# Patient Record
Sex: Female | Born: 1950
Health system: Southern US, Community
[De-identification: ages and names within clinical notes are randomized; demographics above are authoritative.]

## PROBLEM LIST (undated history)

## (undated) DIAGNOSIS — F319 Bipolar disorder, unspecified: Secondary | ICD-10-CM

## (undated) DIAGNOSIS — F329 Major depressive disorder, single episode, unspecified: Secondary | ICD-10-CM

## (undated) DIAGNOSIS — E669 Obesity, unspecified: Secondary | ICD-10-CM

## (undated) DIAGNOSIS — I2510 Atherosclerotic heart disease of native coronary artery without angina pectoris: Secondary | ICD-10-CM

## (undated) DIAGNOSIS — I509 Heart failure, unspecified: Secondary | ICD-10-CM

## (undated) DIAGNOSIS — J449 Chronic obstructive pulmonary disease, unspecified: Secondary | ICD-10-CM

## (undated) DIAGNOSIS — I639 Cerebral infarction, unspecified: Secondary | ICD-10-CM

## (undated) DIAGNOSIS — E039 Hypothyroidism, unspecified: Secondary | ICD-10-CM

## (undated) DIAGNOSIS — C73 Malignant neoplasm of thyroid gland: Secondary | ICD-10-CM

## (undated) DIAGNOSIS — E079 Disorder of thyroid, unspecified: Secondary | ICD-10-CM

## (undated) DIAGNOSIS — Z951 Presence of aortocoronary bypass graft: Secondary | ICD-10-CM

## (undated) DIAGNOSIS — F32A Depression, unspecified: Secondary | ICD-10-CM

## (undated) DIAGNOSIS — I10 Essential (primary) hypertension: Secondary | ICD-10-CM

## (undated) DIAGNOSIS — M1990 Unspecified osteoarthritis, unspecified site: Secondary | ICD-10-CM

## (undated) DIAGNOSIS — G459 Transient cerebral ischemic attack, unspecified: Secondary | ICD-10-CM

## (undated) DIAGNOSIS — I4892 Unspecified atrial flutter: Secondary | ICD-10-CM

## (undated) HISTORY — PX: TONSILLECTOMY: SUR1361

## (undated) HISTORY — DX: Unspecified atrial flutter: I48.92

## (undated) HISTORY — PX: CORONARY ARTERY BYPASS GRAFT: SHX141

## (undated) HISTORY — DX: Transient cerebral ischemic attack, unspecified: G45.9

## (undated) HISTORY — PX: ABDOMINAL HYSTERECTOMY: SHX81

---

## 2014-11-28 ENCOUNTER — Emergency Department (HOSPITAL_BASED_OUTPATIENT_CLINIC_OR_DEPARTMENT_OTHER)
Admission: EM | Admit: 2014-11-28 | Discharge: 2014-11-28 | Disposition: A | Discharge status: Home / Self Care | Payer: Medicare Other | Attending: Emergency Medicine | Admitting: Emergency Medicine

## 2014-11-28 ENCOUNTER — Encounter (HOSPITAL_BASED_OUTPATIENT_CLINIC_OR_DEPARTMENT_OTHER): Payer: Self-pay | Admitting: *Deleted

## 2014-11-28 DIAGNOSIS — Z8739 Personal history of other diseases of the musculoskeletal system and connective tissue: Secondary | ICD-10-CM | POA: Insufficient documentation

## 2014-11-28 DIAGNOSIS — I10 Essential (primary) hypertension: Secondary | ICD-10-CM | POA: Insufficient documentation

## 2014-11-28 DIAGNOSIS — Z85850 Personal history of malignant neoplasm of thyroid: Secondary | ICD-10-CM | POA: Insufficient documentation

## 2014-11-28 DIAGNOSIS — B3749 Other urogenital candidiasis: Secondary | ICD-10-CM | POA: Diagnosis not present

## 2014-11-28 DIAGNOSIS — L739 Follicular disorder, unspecified: Secondary | ICD-10-CM | POA: Diagnosis not present

## 2014-11-28 DIAGNOSIS — Z79899 Other long term (current) drug therapy: Secondary | ICD-10-CM | POA: Insufficient documentation

## 2014-11-28 DIAGNOSIS — E079 Disorder of thyroid, unspecified: Secondary | ICD-10-CM | POA: Diagnosis not present

## 2014-11-28 DIAGNOSIS — F329 Major depressive disorder, single episode, unspecified: Secondary | ICD-10-CM | POA: Diagnosis not present

## 2014-11-28 DIAGNOSIS — E669 Obesity, unspecified: Secondary | ICD-10-CM | POA: Insufficient documentation

## 2014-11-28 DIAGNOSIS — Z951 Presence of aortocoronary bypass graft: Secondary | ICD-10-CM | POA: Insufficient documentation

## 2014-11-28 DIAGNOSIS — R21 Rash and other nonspecific skin eruption: Secondary | ICD-10-CM | POA: Diagnosis present

## 2014-11-28 DIAGNOSIS — B379 Candidiasis, unspecified: Secondary | ICD-10-CM

## 2014-11-28 HISTORY — DX: Malignant neoplasm of thyroid gland: C73

## 2014-11-28 HISTORY — DX: Disorder of thyroid, unspecified: E07.9

## 2014-11-28 HISTORY — DX: Unspecified osteoarthritis, unspecified site: M19.90

## 2014-11-28 HISTORY — DX: Depression, unspecified: F32.A

## 2014-11-28 HISTORY — DX: Presence of aortocoronary bypass graft: Z95.1

## 2014-11-28 HISTORY — DX: Major depressive disorder, single episode, unspecified: F32.9

## 2014-11-28 HISTORY — DX: Essential (primary) hypertension: I10

## 2014-11-28 MED ORDER — FLUCONAZOLE 100 MG PO TABS: 200.0000 mg | ORAL_TABLET | Freq: Every day | ORAL | Status: AC

## 2014-11-28 MED ORDER — SULFAMETHOXAZOLE-TRIMETHOPRIM 800-160 MG PO TABS: 1.0000 | ORAL_TABLET | Freq: Every day | ORAL | Status: DC

## 2014-11-28 MED ORDER — SULFAMETHOXAZOLE-TRIMETHOPRIM 800-160 MG PO TABS
1.0000 | ORAL_TABLET | Freq: Once | ORAL | Status: AC
  Administered 2014-11-28: 1 via ORAL
  Filled 2014-11-28: qty 1

## 2014-11-28 MED ORDER — FLUCONAZOLE 50 MG PO TABS
150.0000 mg | ORAL_TABLET | Freq: Once | ORAL | Status: AC
  Administered 2014-11-28: 150 mg via ORAL
  Filled 2014-11-28 (×2): qty 1

## 2014-11-28 NOTE — ED Provider Notes (Signed)
CSN: [id number]     Arrival date & time 11/28/14  1638 History   First MD Initiated Contact with Patient 11/28/14 1756     Chief Complaint  Patient presents with  . Rash     (Consider location/radiation/quality/duration/timing/severity/associated sxs/prior Treatment) HPI Patient presents with concern of ongoing discomfort from multiple cutaneous lesions. Lesions have been present for at least several months, though more symptomatic discomfort has been occurring over the past few days. Patient describes irritation, burning in her lower abdomen, periumbilical area.  She also describes pruritus, burning on the multiple smaller areas scattered throughout her habitus. No concurrent f/c, n/v/d, cp/dyspnea.  She has previously been prescribed topical antifungal, though it is unclear if she took the course.  Past Medical History  Diagnosis Date  . Depression   . Hypertension   . Thyroid disease   . Thyroid ca   . S/P CABG x 3   . Arthritis    Past Surgical History  Procedure Laterality Date  . Coronary artery bypass graft    . Tonsillectomy    . Abdominal hysterectomy     History reviewed. No pertinent family history. History  Substance Use Topics  . Smoking status: Never Smoker   . Smokeless tobacco: Not on file  . Alcohol Use: No   OB History    No data available     Review of Systems  Constitutional:       Per HPI, otherwise negative  HENT:       Per HPI, otherwise negative  Respiratory:       Per HPI, otherwise negative  Cardiovascular:       Per HPI, otherwise negative  Gastrointestinal: Negative for vomiting.  Endocrine:       Negative aside from HPI  Genitourinary:       Neg aside from HPI   Musculoskeletal:       Per HPI, otherwise negative  Skin: Positive for color change.  Neurological: Negative for syncope.      Allergies  Chocolate and Peanut-containing drug products  Home Medications   Prior to Admission medications   Medication Sig Start  Date End Date Taking? Authorizing Provider  citalopram (CELEXA) 10 MG tablet Take 10 mg by mouth daily.   Yes Historical Provider, MD  gabapentin (NEURONTIN) 100 MG capsule Take 200 mg by mouth at bedtime.   Yes Historical Provider, MD  hydrochlorothiazide (MICROZIDE) 12.5 MG capsule Take 12.5 mg by mouth daily.   Yes Historical Provider, MD  hydrOXYzine (ATARAX/VISTARIL) 25 MG tablet Take 25 mg by mouth 3 (three) times daily as needed.   Yes Historical Provider, MD  levothyroxine (SYNTHROID, LEVOTHROID) 125 MCG tablet Take 125 mcg by mouth daily before breakfast.   Yes Historical Provider, MD  lisinopril (PRINIVIL,ZESTRIL) 20 MG tablet Take 20 mg by mouth daily.   Yes Historical Provider, MD  fluconazole (DIFLUCAN) 100 MG tablet Take 2 tablets (200 mg total) by mouth daily. 11/29/14 12/05/14  Carmin Muskrat, MD  sulfamethoxazole-trimethoprim (BACTRIM DS,SEPTRA DS) 800-160 MG per tablet Take 1 tablet by mouth daily. 11/28/14   Carmin Muskrat, MD   BP 135/80 mmHg  Pulse 83  Temp(Src) 98 F (36.7 C) (Oral)  Resp 18  Ht 5' (1.524 m)  Wt 260 lb (117.935 kg)  BMI 50.78 kg/m2  SpO2 95% Physical Exam  Constitutional: She is oriented to person, place, and time. She appears well-developed and well-nourished. No distress.  Obese F siting upright, speaking clearly, in NAD.  HENT:  Head: Normocephalic  and atraumatic.  Eyes: Conjunctivae and EOM are normal.  Cardiovascular: Normal rate and regular rhythm.   Pulmonary/Chest: Effort normal and breath sounds normal. No stridor. No respiratory distress.  Abdominal: She exhibits no distension.  Musculoskeletal: She exhibits no edema.  Neurological: She is alert and oriented to person, place, and time. No cranial nerve deficit.  Skin: Skin is warm and dry.     Psychiatric: She has a normal mood and affect.  Nursing note and vitals reviewed.   ED Course  Procedures (including critical care time)   MDM   Final diagnoses:  Candida infection    Folliculitis   2 presents with cutaneous lesions consistent with folliculitis and fungal infection. No evidence for bacteremia, sepsis.  Patient started on a course of antibiotic, provided local outpatient follow-up.    Carmin Muskrat, MD 11/28/14 [phone]

## 2014-11-28 NOTE — Discharge Instructions (Signed)
As discussed, your evaluation tonight has been largely reassuring. There is evidence for two types of skin infection, one yeast and one bacterial. Please take all medication as directed, and be sure to follow-up with a primary care physician.  Return here for concerning changes in your condition.

## 2014-11-28 NOTE — ED Notes (Signed)
Erythematous, burning excoriated area under pannus- states she has been treated for this in the past with a "cream", but never went away

## 2014-11-28 NOTE — ED Notes (Signed)
Pt c/o rash under breast x 1 month.

## 2015-06-22 ENCOUNTER — Emergency Department (HOSPITAL_BASED_OUTPATIENT_CLINIC_OR_DEPARTMENT_OTHER): Payer: Medicare Other

## 2015-06-22 ENCOUNTER — Inpatient Hospital Stay (HOSPITAL_BASED_OUTPATIENT_CLINIC_OR_DEPARTMENT_OTHER)
Admission: EM | Admit: 2015-06-22 | Discharge: 2015-06-26 | DRG: 292 | Disposition: A | Discharge status: Home Health | Payer: Medicare Other | Attending: Internal Medicine | Admitting: Internal Medicine

## 2015-06-22 ENCOUNTER — Encounter (HOSPITAL_BASED_OUTPATIENT_CLINIC_OR_DEPARTMENT_OTHER): Payer: Self-pay | Admitting: Emergency Medicine

## 2015-06-22 DIAGNOSIS — Z91010 Allergy to peanuts: Secondary | ICD-10-CM

## 2015-06-22 DIAGNOSIS — J441 Chronic obstructive pulmonary disease with (acute) exacerbation: Secondary | ICD-10-CM | POA: Diagnosis present

## 2015-06-22 DIAGNOSIS — Z90710 Acquired absence of both cervix and uterus: Secondary | ICD-10-CM | POA: Diagnosis not present

## 2015-06-22 DIAGNOSIS — R0602 Shortness of breath: Secondary | ICD-10-CM | POA: Diagnosis present

## 2015-06-22 DIAGNOSIS — D649 Anemia, unspecified: Secondary | ICD-10-CM | POA: Diagnosis present

## 2015-06-22 DIAGNOSIS — I10 Essential (primary) hypertension: Secondary | ICD-10-CM | POA: Diagnosis present

## 2015-06-22 DIAGNOSIS — I5030 Unspecified diastolic (congestive) heart failure: Secondary | ICD-10-CM | POA: Diagnosis present

## 2015-06-22 DIAGNOSIS — Z85850 Personal history of malignant neoplasm of thyroid: Secondary | ICD-10-CM

## 2015-06-22 DIAGNOSIS — J811 Chronic pulmonary edema: Secondary | ICD-10-CM | POA: Diagnosis present

## 2015-06-22 DIAGNOSIS — M1990 Unspecified osteoarthritis, unspecified site: Secondary | ICD-10-CM | POA: Diagnosis present

## 2015-06-22 DIAGNOSIS — J449 Chronic obstructive pulmonary disease, unspecified: Secondary | ICD-10-CM

## 2015-06-22 DIAGNOSIS — Z91018 Allergy to other foods: Secondary | ICD-10-CM

## 2015-06-22 DIAGNOSIS — R0600 Dyspnea, unspecified: Secondary | ICD-10-CM | POA: Diagnosis not present

## 2015-06-22 DIAGNOSIS — Z6841 Body Mass Index (BMI) 40.0 and over, adult: Secondary | ICD-10-CM

## 2015-06-22 DIAGNOSIS — E669 Obesity, unspecified: Secondary | ICD-10-CM | POA: Diagnosis present

## 2015-06-22 DIAGNOSIS — J9811 Atelectasis: Secondary | ICD-10-CM | POA: Diagnosis present

## 2015-06-22 DIAGNOSIS — I110 Hypertensive heart disease with heart failure: Secondary | ICD-10-CM | POA: Diagnosis present

## 2015-06-22 DIAGNOSIS — J810 Acute pulmonary edema: Secondary | ICD-10-CM | POA: Diagnosis present

## 2015-06-22 DIAGNOSIS — Z951 Presence of aortocoronary bypass graft: Secondary | ICD-10-CM

## 2015-06-22 DIAGNOSIS — I2510 Atherosclerotic heart disease of native coronary artery without angina pectoris: Secondary | ICD-10-CM | POA: Diagnosis present

## 2015-06-22 DIAGNOSIS — I5040 Unspecified combined systolic (congestive) and diastolic (congestive) heart failure: Secondary | ICD-10-CM

## 2015-06-22 DIAGNOSIS — Z9889 Other specified postprocedural states: Secondary | ICD-10-CM | POA: Diagnosis not present

## 2015-06-22 DIAGNOSIS — I5031 Acute diastolic (congestive) heart failure: Secondary | ICD-10-CM | POA: Diagnosis present

## 2015-06-22 DIAGNOSIS — F329 Major depressive disorder, single episode, unspecified: Secondary | ICD-10-CM | POA: Diagnosis present

## 2015-06-22 DIAGNOSIS — R0902 Hypoxemia: Secondary | ICD-10-CM | POA: Diagnosis present

## 2015-06-22 DIAGNOSIS — Z79899 Other long term (current) drug therapy: Secondary | ICD-10-CM | POA: Diagnosis not present

## 2015-06-22 DIAGNOSIS — E890 Postprocedural hypothyroidism: Secondary | ICD-10-CM | POA: Diagnosis present

## 2015-06-22 HISTORY — DX: Obesity, unspecified: E66.9

## 2015-06-22 LAB — CBC
HCT: 33.3 % — ABNORMAL LOW (ref 36.0–46.0)
HEMOGLOBIN: 10.1 g/dL — AB (ref 12.0–15.0)
MCH: 25.6 pg — ABNORMAL LOW (ref 26.0–34.0)
MCHC: 30.3 g/dL (ref 30.0–36.0)
MCV: 84.5 fL (ref 78.0–100.0)
PLATELETS: 350 10*3/uL (ref 150–400)
RBC: 3.94 MIL/uL (ref 3.87–5.11)
RDW: 23.1 % — ABNORMAL HIGH (ref 11.5–15.5)
WBC: 6.7 10*3/uL (ref 4.0–10.5)

## 2015-06-22 LAB — I-STAT CHEM 8, ED
BUN: 18 mg/dL (ref 6–20)
CALCIUM ION: 1.1 mmol/L — AB (ref 1.13–1.30)
CHLORIDE: 109 mmol/L (ref 101–111)
Creatinine, Ser: 1 mg/dL (ref 0.44–1.00)
GLUCOSE: 88 mg/dL (ref 65–99)
HCT: 34 % — ABNORMAL LOW (ref 36.0–46.0)
Hemoglobin: 11.6 g/dL — ABNORMAL LOW (ref 12.0–15.0)
Potassium: 3.9 mmol/L (ref 3.5–5.1)
Sodium: 143 mmol/L (ref 135–145)
TCO2: 29 mmol/L (ref 0–100)

## 2015-06-22 LAB — TROPONIN I: Troponin I: <0.03 ng/mL (ref <0.031)

## 2015-06-22 LAB — BRAIN NATRIURETIC PEPTIDE: B Natriuretic Peptide: 147.9 pg/mL — ABNORMAL HIGH (ref 0.0–100.0)

## 2015-06-22 MED ORDER — FUROSEMIDE 10 MG/ML IJ SOLN
20.0000 mg | Freq: Once | INTRAMUSCULAR | Status: AC
  Administered 2015-06-22: 20 mg via INTRAVENOUS
  Filled 2015-06-22: qty 2

## 2015-06-22 MED ORDER — ALBUTEROL SULFATE (2.5 MG/3ML) 0.083% IN NEBU
5.0000 mg | INHALATION_SOLUTION | Freq: Once | RESPIRATORY_TRACT | Status: AC
  Administered 2015-06-22: 5 mg via RESPIRATORY_TRACT
  Filled 2015-06-22: qty 6

## 2015-06-22 MED ORDER — IOHEXOL 350 MG/ML SOLN
100.0000 mL | Freq: Once | INTRAVENOUS | Status: AC | PRN
  Administered 2015-06-22: 100 mL via INTRAVENOUS

## 2015-06-22 NOTE — ED Notes (Signed)
Patient transported to CT 

## 2015-06-22 NOTE — ED Notes (Signed)
MD at bedside. 

## 2015-06-22 NOTE — ED Notes (Addendum)
Patient states that she has been sick for 1 week. The patient has audible wheezing and has increased dyspnea with excursion and activity. Patient reports that she has been without A/c for about 3 weeks. However her breathing has become progressively worse. The patient also reports that she can not lay down due to Dyspnea.

## 2015-06-22 NOTE — ED Notes (Signed)
Pt returned from CT °

## 2015-06-22 NOTE — ED Provider Notes (Signed)
CSN: [id number]     Arrival date & time 06/22/15  1723 History  This chart was scribed for Elnora Morrison, MD by Helane Gunther, ED Scribe. This patient was seen in room MH12/[facility]12 and the patient's care was started at 5:50 PM.    Chief Complaint  Patient presents with  . Shortness of Breath  The history is provided by the patient. No language interpreter was used.   HPI Comments: Tonya Hogan is a 64 y.o. female who presents to the Emergency Department complaining of SOB onset 1 week ago. She notes associated productive cough (yellow sputum), fever, chills, right side pain, ankle swelling. She states that she has no AC at home right now and has been very hot. She has a PSHx for internal hemorrhoids. She is not on oxygen at home and has no Hx of lung issues or PE. Pt denies CP. Nothing improves sob, worse with exertion. No PE hx.  Non smoker.  Past Medical History  Diagnosis Date  . Depression   . Hypertension   . Thyroid disease   . Thyroid ca   . S/P CABG x 3   . Arthritis   . Obesity    Past Surgical History  Procedure Laterality Date  . Coronary artery bypass graft    . Tonsillectomy    . Abdominal hysterectomy     History reviewed. No pertinent family history. History  Substance Use Topics  . Smoking status: Never Smoker   . Smokeless tobacco: Not on file  . Alcohol Use: No   OB History    No data available     Review of Systems  Respiratory: Positive for cough, shortness of breath and wheezing.   All other systems reviewed and are negative.   Allergies  Chocolate and Peanut-containing drug products  Home Medications   Prior to Admission medications   Medication Sig Start Date End Date Taking? Authorizing Provider  citalopram (CELEXA) 10 MG tablet Take 10 mg by mouth daily.    Historical Provider, MD  gabapentin (NEURONTIN) 100 MG capsule Take 200 mg by mouth at bedtime.    Historical Provider, MD  hydrochlorothiazide (MICROZIDE) 12.5 MG capsule Take 12.5 mg by  mouth daily.    Historical Provider, MD  hydrOXYzine (ATARAX/VISTARIL) 25 MG tablet Take 25 mg by mouth 3 (three) times daily as needed.    Historical Provider, MD  levothyroxine (SYNTHROID, LEVOTHROID) 125 MCG tablet Take 125 mcg by mouth daily before breakfast.    Historical Provider, MD  lisinopril (PRINIVIL,ZESTRIL) 20 MG tablet Take 20 mg by mouth daily.    Historical Provider, MD  sulfamethoxazole-trimethoprim (BACTRIM DS,SEPTRA DS) 800-160 MG per tablet Take 1 tablet by mouth daily. 11/28/14   Carmin Muskrat, MD   BP 134/61 mmHg  Pulse 78  Temp(Src) 98.3 F (36.8 C) (Oral)  Resp 14  Ht 5\' 1"  (1.549 m)  Wt 260 lb (117.935 kg)  BMI 49.15 kg/m2  SpO2 92% Physical Exam  Constitutional: She is oriented to person, place, and time. She appears well-developed and well-nourished. No distress.  HENT:  Head: Normocephalic and atraumatic.  Mouth/Throat: Oropharynx is clear and moist.  Eyes: Conjunctivae and EOM are normal. Pupils are equal, round, and reactive to light.  Neck: Normal range of motion. Neck supple. No tracheal deviation present.  Cardiovascular: Normal rate, regular rhythm and normal heart sounds.   Pulmonary/Chest: No respiratory distress. She has wheezes.  Anterior wheezing. Mild tachypnea  Abdominal: Soft.  Musculoskeletal: Normal range of motion.  Mild swelling  to the ankles and feet.  Neurological: She is alert and oriented to person, place, and time.  Skin: Skin is warm and dry.  Psychiatric: She has a normal mood and affect. Her behavior is normal.  Nursing note and vitals reviewed.   ED Course  Procedures  DIAGNOSTIC STUDIES: Oxygen Saturation is 89% on RA, low by my interpretation.    COORDINATION OF CARE: 5:55 PM - Discussed plans to order diagnostic studies. Pt advised of plan for treatment and pt agrees.  Labs Review Labs Reviewed  CBC - Abnormal; Notable for the following:    Hemoglobin 10.1 (*)    HCT 33.3 (*)    MCH 25.6 (*)    RDW 23.1 (*)     All other components within normal limits  BRAIN NATRIURETIC PEPTIDE - Abnormal; Notable for the following:    B Natriuretic Peptide 147.9 (*)    All other components within normal limits  I-STAT CHEM 8, ED - Abnormal; Notable for the following:    Calcium, Ion 1.10 (*)    Hemoglobin 11.6 (*)    HCT 34.0 (*)    All other components within normal limits  TROPONIN I    Imaging Review Dg Chest 2 View  06/22/2015   CLINICAL DATA:  Shortness of breath and wheezing 1 week.  EXAM: CHEST  2 VIEW  COMPARISON:  None.  FINDINGS: Sternotomy wires are present. Lungs are adequately inflated and demonstrate prominence of the perihilar markings bilaterally likely mild interstitial edema. There is linear density over the left midlung likely atelectasis. No evidence of effusion or pneumothorax. There is mild cardiomegaly. There are mild degenerate changes of the spine.  IMPRESSION: Mild cardiomegaly with evidence of mild vascular congestion/edema. Linear atelectasis left midlung.   Electronically Signed   By: Marin Olp M.D.   On: 06/22/2015 18:15   Ct Angio Chest Pe W/cm &/or Wo Cm  06/22/2015   CLINICAL DATA:  Dyspnea/short of breath. Hypoxia. Recent surgery. Productive cough. Fever. Chills. RIGHT-sided pain.  EXAM: CT ANGIOGRAPHY CHEST WITH CONTRAST  TECHNIQUE: Multidetector CT imaging of the chest was performed using the standard protocol during bolus administration of intravenous contrast. Multiplanar CT image reconstructions and MIPs were obtained to evaluate the vascular anatomy.  CONTRAST:  162mL OMNIPAQUE IOHEXOL 350 MG/ML SOLN  COMPARISON:  Chest radiograph 06/22/2015.  FINDINGS: Bones: Lower cervical spondylosis partially visible. No aggressive osseous lesions. Median sternotomy.  Cardiovascular: Aortic atherosclerosis. Technically adequate study for evaluation of pulmonary embolism. No pulmonary embolus is present. No acute aortic abnormality.  Lungs: Interlobular septal thickening is present and there  are areas of atelectasis along with mosaic attenuation of the lungs. Most of the areas of airspace opacification appear linear. The overall picture is compatible with CHF and pulmonary edema. Pneumonia is unlikely based on the pattern.  Central airways: Patent.  Effusions: None.  Lymphadenopathy: None.  Esophagus: Small hiatal hernia.  Upper abdomen: No acute abnormality.  Other: Thyroidectomy.  Review of the MIP images confirms the above findings.  IMPRESSION: 1. Negative for pulmonary embolus or acute aortic abnormality. 2. Ground-glass attenuation in the lungs with scattered areas of airspace opacification and prominent atelectasis. Findings are most compatible with pulmonary edema and CHF. Pneumonia is unlikely.   Electronically Signed   By: Dereck Ligas M.D.   On: 06/22/2015 19:54     EKG Interpretation   Date/Time:  Sunday June 22 2015 17:38:15 EDT Ventricular Rate:  88 PR Interval:  152 QRS Duration: 86 QT Interval:  432  QTC Calculation: 522 R Axis:   61 Text Interpretation:  Normal sinus rhythm Nonspecific T wave abnormality  Prolonged QT Abnormal ECG Confirmed by Daily Doe  MD, Derra Shartzer ([id number]) on  06/22/2015 7:14:24 PM      MDM   Final diagnoses:  Hypoxia  Dyspnea   Patient presents with worsening dyspnea, wheezing for the past week. Clinical concern for pneumonia versus heart failure. Chest x-ray reviewed interstitial findings. Patient had recent surgery, CT scan ordered no blood clot, palmar edema. Patient improved on reassessment, 2 L urine output with Lasix. Patient requiring 1-2 L nasal cannula. Discussed with [facility] hospitalist for transfer/admission for further workup.  The patients results and plan were reviewed and discussed.   Any x-rays performed were independently reviewed by myself.   Differential diagnosis were considered with the presenting HPI.  Medications  furosemide (LASIX) injection 20 mg (not administered)  albuterol (PROVENTIL) (2.5 MG/3ML) 0.083%  nebulizer solution 5 mg (5 mg Nebulization Given 06/22/15 1753)  albuterol (PROVENTIL) (2.5 MG/3ML) 0.083% nebulizer solution 5 mg (5 mg Nebulization Given 06/22/15 1816)  furosemide (LASIX) injection 20 mg (20 mg Intravenous Given 06/22/15 1955)  iohexol (OMNIPAQUE) 350 MG/ML injection 100 mL (100 mLs Intravenous Contrast Given 06/22/15 1930)    Filed Vitals:   06/22/15 1830 06/22/15 1900 06/22/15 2000 06/22/15 2144  BP: 148/56 134/61 135/61 144/72  Pulse: 81 78 79 67  Temp:      TempSrc:      Resp: 27 14 20 16   Height:      Weight:      SpO2: 89% 92% 95% 96%    Final diagnoses:  Hypoxia  Dyspnea    Admission/ observation were discussed with the admitting physician, patient and/or family and they are comfortable with the plan.    Elnora Morrison, MD 06/22/15 2250

## 2015-06-22 NOTE — ED Notes (Signed)
Patient in CT

## 2015-06-23 ENCOUNTER — Encounter (HOSPITAL_COMMUNITY): Payer: Self-pay | Admitting: Internal Medicine

## 2015-06-23 DIAGNOSIS — E890 Postprocedural hypothyroidism: Secondary | ICD-10-CM | POA: Diagnosis present

## 2015-06-23 DIAGNOSIS — D649 Anemia, unspecified: Secondary | ICD-10-CM | POA: Diagnosis present

## 2015-06-23 DIAGNOSIS — R0602 Shortness of breath: Secondary | ICD-10-CM | POA: Diagnosis present

## 2015-06-23 DIAGNOSIS — I5040 Unspecified combined systolic (congestive) and diastolic (congestive) heart failure: Secondary | ICD-10-CM

## 2015-06-23 DIAGNOSIS — I110 Hypertensive heart disease with heart failure: Secondary | ICD-10-CM | POA: Diagnosis present

## 2015-06-23 LAB — COMPREHENSIVE METABOLIC PANEL
ALT: 12 U/L — AB (ref 14–54)
AST: 13 U/L — ABNORMAL LOW (ref 15–41)
Albumin: 3.1 g/dL — ABNORMAL LOW (ref 3.5–5.0)
Alkaline Phosphatase: 61 U/L (ref 38–126)
Anion gap: 3 — ABNORMAL LOW (ref 5–15)
BILIRUBIN TOTAL: 0.4 mg/dL (ref 0.3–1.2)
BUN: 9 mg/dL (ref 6–20)
CALCIUM: 8.7 mg/dL — AB (ref 8.9–10.3)
CO2: 36 mmol/L — AB (ref 22–32)
CREATININE: 0.84 mg/dL (ref 0.44–1.00)
Chloride: 102 mmol/L (ref 101–111)
GFR calc Af Amer: >60 mL/min (ref >60)
GFR calc non Af Amer: >60 mL/min (ref >60)
Glucose, Bld: 101 mg/dL — ABNORMAL HIGH (ref 65–99)
Potassium: 3.3 mmol/L — ABNORMAL LOW (ref 3.5–5.1)
SODIUM: 141 mmol/L (ref 135–145)
Total Protein: 7.6 g/dL (ref 6.5–8.1)

## 2015-06-23 LAB — CBC WITH DIFFERENTIAL/PLATELET
Basophils Absolute: 0.1 10*3/uL (ref 0.0–0.1)
Basophils Relative: 1 % (ref 0–1)
EOS ABS: 0.4 10*3/uL (ref 0.0–0.7)
Eosinophils Relative: 7 % — ABNORMAL HIGH (ref 0–5)
HCT: 32.2 % — ABNORMAL LOW (ref 36.0–46.0)
HEMOGLOBIN: 9.4 g/dL — AB (ref 12.0–15.0)
Lymphocytes Relative: 28 % (ref 12–46)
Lymphs Abs: 1.6 10*3/uL (ref 0.7–4.0)
MCH: 24.2 pg — ABNORMAL LOW (ref 26.0–34.0)
MCHC: 29.2 g/dL — ABNORMAL LOW (ref 30.0–36.0)
MCV: 82.8 fL (ref 78.0–100.0)
Monocytes Absolute: 0.8 10*3/uL (ref 0.1–1.0)
Monocytes Relative: 14 % — ABNORMAL HIGH (ref 3–12)
NEUTROS PCT: 50 % (ref 43–77)
Neutro Abs: 2.9 10*3/uL (ref 1.7–7.7)
PLATELETS: 301 10*3/uL (ref 150–400)
RBC: 3.89 MIL/uL (ref 3.87–5.11)
RDW: 21.8 % — AB (ref 11.5–15.5)
WBC: 5.8 10*3/uL (ref 4.0–10.5)

## 2015-06-23 LAB — TROPONIN I
Troponin I: <0.03 ng/mL (ref <0.031)
Troponin I: <0.03 ng/mL (ref <0.031)

## 2015-06-23 LAB — TSH: TSH: 2.715 u[IU]/mL (ref 0.350–4.500)

## 2015-06-23 MED ORDER — ACETAMINOPHEN 650 MG RE SUPP: 650.0000 mg | Freq: Four times a day (QID) | RECTAL | Status: DC | PRN

## 2015-06-23 MED ORDER — PREDNISONE 20 MG PO TABS
40.0000 mg | ORAL_TABLET | Freq: Two times a day (BID) | ORAL | Status: DC
  Administered 2015-06-23 – 2015-06-26 (×7): 40 mg via ORAL
  Filled 2015-06-23 (×10): qty 2

## 2015-06-23 MED ORDER — CITALOPRAM HYDROBROMIDE 10 MG PO TABS
10.0000 mg | ORAL_TABLET | Freq: Every day | ORAL | Status: DC
Start: 2015-06-23 — End: 2015-06-26
  Administered 2015-06-23 – 2015-06-26 (×4): 10 mg via ORAL
  Filled 2015-06-23 (×4): qty 1

## 2015-06-23 MED ORDER — HYDROCHLOROTHIAZIDE 12.5 MG PO CAPS
12.5000 mg | ORAL_CAPSULE | Freq: Every day | ORAL | Status: DC
  Administered 2015-06-23 – 2015-06-26 (×4): 12.5 mg via ORAL
  Filled 2015-06-23 (×4): qty 1

## 2015-06-23 MED ORDER — ACETAMINOPHEN 325 MG PO TABS: 650.0000 mg | ORAL_TABLET | Freq: Four times a day (QID) | ORAL | Status: DC | PRN

## 2015-06-23 MED ORDER — GABAPENTIN 100 MG PO CAPS
200.0000 mg | ORAL_CAPSULE | Freq: Every day | ORAL | Status: DC
  Administered 2015-06-23 – 2015-06-25 (×3): 200 mg via ORAL
  Filled 2015-06-23 (×4): qty 2

## 2015-06-23 MED ORDER — ONDANSETRON HCL 4 MG/2ML IJ SOLN: 4.0000 mg | Freq: Four times a day (QID) | INTRAMUSCULAR | Status: DC | PRN

## 2015-06-23 MED ORDER — HYDROXYZINE HCL 25 MG PO TABS
25.0000 mg | ORAL_TABLET | Freq: Three times a day (TID) | ORAL | Status: DC | PRN
  Filled 2015-06-23: qty 1

## 2015-06-23 MED ORDER — LISINOPRIL 20 MG PO TABS
20.0000 mg | ORAL_TABLET | Freq: Every day | ORAL | Status: DC
  Administered 2015-06-23 – 2015-06-26 (×4): 20 mg via ORAL
  Filled 2015-06-23 (×4): qty 1

## 2015-06-23 MED ORDER — ALBUTEROL SULFATE (2.5 MG/3ML) 0.083% IN NEBU
2.5000 mg | INHALATION_SOLUTION | Freq: Four times a day (QID) | RESPIRATORY_TRACT | Status: DC | PRN
Start: 2015-06-23 — End: 2015-06-26

## 2015-06-23 MED ORDER — SODIUM CHLORIDE 0.9 % IJ SOLN
3.0000 mL | Freq: Two times a day (BID) | INTRAMUSCULAR | Status: DC
  Administered 2015-06-23 – 2015-06-26 (×7): 3 mL via INTRAVENOUS

## 2015-06-23 MED ORDER — POTASSIUM CHLORIDE CRYS ER 20 MEQ PO TBCR
40.0000 meq | EXTENDED_RELEASE_TABLET | Freq: Once | ORAL | Status: AC
  Administered 2015-06-23: 40 meq via ORAL
  Filled 2015-06-23: qty 2

## 2015-06-23 MED ORDER — OXYCODONE HCL 5 MG PO TABS
5.0000 mg | ORAL_TABLET | ORAL | Status: DC | PRN
  Administered 2015-06-23 – 2015-06-24 (×2): 5 mg via ORAL
  Filled 2015-06-23 (×2): qty 1

## 2015-06-23 MED ORDER — ENOXAPARIN SODIUM 60 MG/0.6ML ~~LOC~~ SOLN
60.0000 mg | SUBCUTANEOUS | Status: DC
  Administered 2015-06-23 – 2015-06-25 (×3): 60 mg via SUBCUTANEOUS
  Filled 2015-06-23 (×4): qty 0.6

## 2015-06-23 MED ORDER — FUROSEMIDE 10 MG/ML IJ SOLN
40.0000 mg | Freq: Every day | INTRAMUSCULAR | Status: DC
  Administered 2015-06-23: 40 mg via INTRAVENOUS
  Filled 2015-06-23 (×2): qty 4

## 2015-06-23 MED ORDER — LEVOTHYROXINE SODIUM 125 MCG PO TABS
125.0000 ug | ORAL_TABLET | Freq: Every day | ORAL | Status: DC
  Administered 2015-06-23 – 2015-06-26 (×4): 125 ug via ORAL
  Filled 2015-06-23 (×6): qty 1

## 2015-06-23 MED ORDER — ONDANSETRON HCL 4 MG PO TABS: 4.0000 mg | ORAL_TABLET | Freq: Four times a day (QID) | ORAL | Status: DC | PRN

## 2015-06-23 MED ORDER — POTASSIUM CHLORIDE CRYS ER 10 MEQ PO TBCR
10.0000 meq | EXTENDED_RELEASE_TABLET | Freq: Every day | ORAL | Status: DC
Start: 2015-06-23 — End: 2015-06-23
  Filled 2015-06-23: qty 1

## 2015-06-23 NOTE — Progress Notes (Signed)
[facility] PROGRESS NOTE  Glenn Christo QPY:[id number] DOB: [birth year] DOA: 06/22/2015  PCP: Patient patient's primary care physician in [facility].  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 She underwent hemorrhoidectomy last week. Patient was evaluated in the emergency department. CT angiogram of the chest did not show any pulmonary embolism but did show pulmonary edema. Patient was hospitalized for further management.  Past medical history:  Past Medical History  Diagnosis Date  . Depression   . Hypertension   . Thyroid disease   . Thyroid ca   . S/P CABG x 3   . Arthritis   . Obesity     Consultants: None  Procedures:  2-D echocardiogram is pending  Antibiotics: None  Subjective: Patient feels better this morning. Not as short of breath as she was yesterday. Denies any chest pain, nausea or vomiting. Does have some swelling in her lower extremities.  Objective: Vital Signs  Filed Vitals:   06/23/15 0017 06/23/15 0105 06/23/15 0423 06/23/15 1031  BP: 140/54 156/78 124/51 152/64  Pulse: 65 75 70 65  Temp: 98.1 F (36.7 C) 98.4 F (36.9 C) 98.1 F (36.7 C) 99.1 F (37.3 C)  TempSrc: Oral Oral Oral Oral  Resp: 20 18 16 18   Height:  5' (1.524 m)    Weight:  116.166 kg (256 lb 1.6 oz)    SpO2: 95% 94% 97% 93%    Intake/Output Summary (Last 24 hours) at 06/23/15 1405 Last data filed at 06/23/15 1230  Gross per 24 hour  Intake    762 ml  Output   3800 ml  Net  -3038 ml   Filed Weights   06/22/15 1727 06/23/15 0105  Weight: 117.935 kg (260 lb) 116.166 kg (256 lb 1.6 oz)    General appearance: alert, cooperative, appears stated age and no distress Resp: Diminished air entry at the bases. scattered wheezes bilaterally.Marland Kitchen No crackles. Cardio: regular rate and rhythm, S1, S2  normal, no murmur, click, rub or gallop GI: soft, non-tender; bowel sounds normal; no masses,  no organomegaly Extremities: Pedal edema noted bilateral lower extremities Neurologic: No focal deficits  Lab Results:  Basic Metabolic Panel:  Recent Labs Lab 06/22/15 1744 06/23/15 0421  NA 143 141  K 3.9 3.3*  CL 109 102  CO2  --  36*  GLUCOSE 88 101*  BUN 18 9  CREATININE 1.00 0.84  CALCIUM  --  8.7*   Liver Function Tests:  Recent Labs Lab 06/23/15 0421  AST 13*  ALT 12*  ALKPHOS 61  BILITOT 0.4  PROT 7.6  ALBUMIN 3.1*    CBC:  Recent Labs Lab 06/22/15 1743 06/22/15 1744 06/23/15 0421  WBC 6.7  --  5.8  NEUTROABS  --   --  2.9  HGB 10.1* 11.6* 9.4*  HCT 33.3* 34.0* 32.2*  MCV 84.5  --  82.8  PLT 350  --  301   Cardiac Enzymes:  Recent Labs Lab 06/22/15 1743 06/23/15 0421 06/23/15 0843  TROPONINI <0.03 <0.03 <0.03   BNP (last 3 results)  Recent Labs  06/22/15 1743  BNP 147.9*     Studies/Results: Dg Chest 2 View  06/22/2015   CLINICAL DATA:  Shortness of breath and wheezing 1 week.  EXAM: CHEST  2 VIEW  COMPARISON:  None.  FINDINGS: Sternotomy wires are present. Lungs are adequately inflated and demonstrate prominence of the perihilar  markings bilaterally likely mild interstitial edema. There is linear density over the left midlung likely atelectasis. No evidence of effusion or pneumothorax. There is mild cardiomegaly. There are mild degenerate changes of the spine.  IMPRESSION: Mild cardiomegaly with evidence of mild vascular congestion/edema. Linear atelectasis left midlung.   Electronically Signed   By: Marin Olp M.D.   On: 06/22/2015 18:15   Ct Angio Chest Pe W/cm &/or Wo Cm  06/22/2015   CLINICAL DATA:  Dyspnea/short of breath. Hypoxia. Recent surgery. Productive cough. Fever. Chills. RIGHT-sided pain.  EXAM: CT ANGIOGRAPHY CHEST WITH CONTRAST  TECHNIQUE: Multidetector CT imaging of the chest was performed using the standard protocol  during bolus administration of intravenous contrast. Multiplanar CT image reconstructions and MIPs were obtained to evaluate the vascular anatomy.  CONTRAST:  145mL OMNIPAQUE IOHEXOL 350 MG/ML SOLN  COMPARISON:  Chest radiograph 06/22/2015.  FINDINGS: Bones: Lower cervical spondylosis partially visible. No aggressive osseous lesions. Median sternotomy.  Cardiovascular: Aortic atherosclerosis. Technically adequate study for evaluation of pulmonary embolism. No pulmonary embolus is present. No acute aortic abnormality.  Lungs: Interlobular septal thickening is present and there are areas of atelectasis along with mosaic attenuation of the lungs. Most of the areas of airspace opacification appear linear. The overall picture is compatible with CHF and pulmonary edema. Pneumonia is unlikely based on the pattern.  Central airways: Patent.  Effusions: None.  Lymphadenopathy: None.  Esophagus: Small hiatal hernia.  Upper abdomen: No acute abnormality.  Other: Thyroidectomy.  Review of the MIP images confirms the above findings.  IMPRESSION: 1. Negative for pulmonary embolus or acute aortic abnormality. 2. Ground-glass attenuation in the lungs with scattered areas of airspace opacification and prominent atelectasis. Findings are most compatible with pulmonary edema and CHF. Pneumonia is unlikely.   Electronically Signed   By: Dereck Ligas M.D.   On: 06/22/2015 19:54    Medications:  Scheduled: . citalopram  10 mg Oral Daily  . enoxaparin (LOVENOX) injection  60 mg Subcutaneous Q24H  . furosemide  40 mg Intravenous Daily  . gabapentin  200 mg Oral QHS  . hydrochlorothiazide  12.5 mg Oral Daily  . levothyroxine  125 mcg Oral QAC breakfast  . lisinopril  20 mg Oral Daily  . predniSONE  40 mg Oral BID WC  . sodium chloride  3 mL Intravenous Q12H   Continuous:  [id number] **OR** acetaminophen, hydrOXYzine, ondansetron **OR** ondansetron (ZOFRAN) IV, oxyCODONE  Assessment/Plan:  Principal Problem:    Pulmonary edema Active Problems:   Essential hypertension   Chronic anemia   Post-operative hypothyroidism    Acute pulmonary edema No known history of congestive heart failure. Echocardiogram is pending. Continue IV Lasix. Strict ins and outs. Daily weights. Patient is symptomatically improved. Replace potassium.  Bilateral wheezing There could be an element of superimposed bronchitis as well. Started on steroids. Nebulizers as needed.  History of essential hypertension Continue medications. Monitor blood pressures closely.  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 She does not follow up with any cardiologist or cardiothoracic surgeon at this time. Follow-up on echocardiogram.  Chronic anemia Monitor hemoglobin.   DVT Prophylaxis: Lovenox    Code Status: Full code  Family Communication: Discussed with the patient  Disposition Plan: Await improvement in symptoms. Await echocardiogram.     LOS: 1 day   [facility] Pager [phone] 06/23/2015, 2:05 PM  If 7PM-7AM,  please contact night-coverage at [email or web address], password [facility]

## 2015-06-23 NOTE — Evaluation (Signed)
Physical Therapy Evaluation Patient Details Name: Tonya Hogan MRN: [id number] DOB: May 04, 1951 Today's Date: 06/23/2015   History of Present Illness  64 y.o. 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 Patient has been feeling short of breath over the last 1 week with exertion. Denies any productive cough fever or chills. 2 weeks ago patient had chest pain which was self-limited retrosternal pressure-like. Patient still has some pain in the left lower rib area which is tender to touch. CT angiogram of the chest done shows features concerning for pulmonary edema and patient was given 40 mg IV Lasix in the ER following which patient has had good diuresis.  Clinical Impression  Pt presents with generalized weakness, decreased balance and decreased activity tolerance.  Note that pt on RA when PT arrived and SaO2 at 84-88%, therefore donned 2L O2 during gait with pt able to maintain sats in the 90's.  RN made aware.  Will continue to see pt acutely to address deficits.  PT recommends HHPT for follow up, however concerned about pts living conditions in the fact that she does not have air conditioning in new town home that her and family are renting.  Spoke with CSW who is to speak with pt regarding resources.      Follow Up Recommendations Home health PT    Equipment Recommendations  Rolling walker with 5" wheels    Recommendations for Other Services       Precautions / Restrictions Precautions Precautions: Fall Precaution Comments: monitor O2 Restrictions Weight Bearing Restrictions: No      Mobility  Bed Mobility Overal bed mobility: Modified Independent             General bed mobility comments: Pt requires increased time and effort to get to EOB with HOB flat and without rails.  Cues not to pull on  therapist to elevate trunk.   Transfers Overall transfer level: Needs assistance Equipment used: Rolling walker (2 wheeled) Transfers: Sit to/from Stand Sit to Stand: Supervision         General transfer comment: Min cues for hand placement and safety.   Ambulation/Gait Ambulation/Gait assistance: Min guard;Supervision Ambulation Distance (Feet): 200 Feet (15 to restroom) Assistive device: Rolling walker (2 wheeled);None Gait Pattern/deviations: Step-through pattern;Decreased stride length;Trendelenburg;Wide base of support;Drifts right/left     General Gait Details: Initially had pt ambulate to/from restroom without use of AD and note pt tends to "furniture walk" and states that she does this at home.  Pt much more unsteady in this manner.  Provided pt with RW for gait in hallway and note marked improvement in balance and pt able to ambulate at S level.   Stairs            Wheelchair Mobility    Modified Rankin (Stroke Patients Only)       Balance Overall balance assessment: Needs assistance Sitting-balance support: Feet supported Sitting balance-Leahy Scale: Good     Standing balance support: During functional activity;No upper extremity supported Standing balance-Leahy Scale: Fair Standing balance comment: Pt able to stand at sink without UE support to wash/dry hands at S level.                              Pertinent Vitals/Pain Pain Assessment: 0-10 Pain Score: 10-Worst pain ever Pain Location: L side  Pain Descriptors / Indicators: Aching;Sharp Pain Intervention(s): Limited activity within patient's tolerance;Premedicated before session;Monitored during session    Home Living Family/patient expects to be discharged to:: Private residence Living Arrangements: Children Available Help at Discharge: Family Type of Home:  (townhouse) Home Access: Level entry     Home Layout: Two level Home Equipment: None      Prior Function Level of  Independence: Independent               Hand Dominance        Extremity/Trunk Assessment   Upper Extremity Assessment: Defer to OT evaluation           Lower Extremity Assessment: Generalized weakness      Cervical / Trunk Assessment: Kyphotic  Communication   Communication: No difficulties  Cognition Arousal/Alertness: Awake/alert Behavior During Therapy: WFL for tasks assessed/performed Overall Cognitive Status: Within Functional Limits for tasks assessed                      General Comments      Exercises        Assessment/Plan    PT Assessment Patient needs continued PT services  PT Diagnosis Difficulty walking;Generalized weakness   PT Problem List Decreased strength;Decreased activity tolerance;Decreased balance;Decreased mobility;Decreased knowledge of use of DME;Decreased knowledge of precautions;Cardiopulmonary status limiting activity;Obesity  PT Treatment Interventions DME instruction;Gait training;Stair training;Functional mobility training;Therapeutic activities;Therapeutic exercise;Balance training;Patient/family education   PT Goals (Current goals can be found in the Care Plan section) Acute Rehab PT Goals Patient Stated Goal: to return home and feel better PT Goal Formulation: With patient Time For Goal Achievement: 06/30/15 Potential to Achieve Goals: Good    Frequency Min 3X/week   Barriers to discharge Decreased caregiver support      Co-evaluation               End of Session Equipment Utilized During Treatment: Oxygen Activity Tolerance: Patient limited by fatigue Patient left: in chair;with call bell/phone within reach Nurse Communication: Mobility status;Other (comment) (O2 sats)    Functional Assessment Tool Used: clinical judgement Functional Limitation: Mobility: Walking and moving around Mobility: Walking and Moving Around Current Status [phone]): At least 1 percent but less than 20 percent impaired,  limited or restricted Mobility: Walking and Moving Around Goal Status [phone]): 0 percent impaired, limited or restricted    Time: 1338-1415 PT Time Calculation (min) (ACUTE ONLY): 37 min   Charges:   PT Evaluation $Initial PT Evaluation Tier I: 1 Procedure PT Treatments $Gait Training: 8-22 mins   PT G Codes:   PT G-Codes **NOT FOR INPATIENT CLASS** Functional Assessment Tool Used: clinical judgement Functional Limitation: Mobility: Walking and moving around Mobility: Walking and Moving Around Current Status (J8841): At least 1 percent but less than 20 percent impaired, limited or restricted Mobility: Walking and Moving Around Goal Status [phone]): 0 percent impaired, limited or restricted    Denice Bors 06/23/2015, 3:03 PM

## 2015-06-23 NOTE — Plan of Care (Signed)
Problem: Phase I Progression Outcomes Goal: Pain controlled with appropriate interventions Outcome: Not Progressing Pt with complaints of left lower rib pain which was not relieved with prn pain medication. Pt is able to perform adls and move around without any complaints of increased pain. Will monitor.  Problem: Phase I Progression Outcomes Goal: Dyspnea controlled at rest (HF) Outcome: Progressing Pt noted with dyspnea with exertion, but none noted at rest. Pt encouraged to take rest breaks with activity.

## 2015-06-23 NOTE — H&P (Signed)
[facility] History and Physical  Tonya Hogan WJX:[id number] DOB: [birth year] DOA: 06/22/2015  Referring physician: Patient was transferred from [facility] [place]. PCP: No PCP Per Patient patient's primary care physician in [facility]. Specialists: None.  Chief Complaint: Shortness of breath.  HPI: Tonya Hogan is a 64 y.o. 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 Patient has been feeling short of breath over the last 1 week with exertion. Denies any productive cough fever or chills. 2 weeks ago patient had chest pain which was self-limited retrosternal pressure-like. Patient still has some pain in the left lower rib area which is tender to touch. CT angiogram of the chest done shows features concerning for pulmonary edema and patient was given 40 mg IV Lasix in the ER following which patient has had good diuresis. Patient presently is not in distress.   Review of Systems: As presented in the history of presenting illness, rest negative.  Past Medical History  Diagnosis Date  . Depression   . Hypertension   . Thyroid disease   . Thyroid ca   . S/P CABG x 3   . Arthritis   . Obesity    Past Surgical History  Procedure Laterality Date  . Coronary artery bypass graft    . Tonsillectomy    . Abdominal hysterectomy     Social History:  reports that she has never smoked. She does not have any smokeless tobacco history on file. She reports that she does not drink alcohol or use illicit drugs. Where does patient live - home. Can patient participate in ADLs? Yes.  Allergies  Allergen Reactions  . Chocolate   . Peanut-Containing Drug Products     Family History:  Family History  Problem Relation Age of Onset  . Hypertension Sister   . Bipolar disorder Other       Prior to Admission  medications   Medication Sig Start Date End Date Taking? Authorizing Provider  citalopram (CELEXA) 10 MG tablet Take 10 mg by mouth daily.    Historical Provider, MD  gabapentin (NEURONTIN) 100 MG capsule Take 200 mg by mouth at bedtime.    Historical Provider, MD  hydrochlorothiazide (MICROZIDE) 12.5 MG capsule Take 12.5 mg by mouth daily.    Historical Provider, MD  hydrOXYzine (ATARAX/VISTARIL) 25 MG tablet Take 25 mg by mouth 3 (three) times daily as needed.    Historical Provider, MD  levothyroxine (SYNTHROID, LEVOTHROID) 125 MCG tablet Take 125 mcg by mouth daily before breakfast.    Historical Provider, MD  lisinopril (PRINIVIL,ZESTRIL) 20 MG tablet Take 20 mg by mouth daily.    Historical Provider, MD  sulfamethoxazole-trimethoprim (BACTRIM DS,SEPTRA DS) 800-160 MG per tablet Take 1 tablet by mouth daily. 11/28/14   Carmin Muskrat, MD    Physical Exam: Filed Vitals:   06/22/15 2332 06/23/15 0000 06/23/15 0017 06/23/15 0105  BP: 138/69 144/68 140/54 156/78  Pulse: 61 70 65 75  Temp:   98.1 F (36.7 C) 98.4 F (36.9 C)  TempSrc:   Oral Oral  Resp: 20 20 20 18   Height:    5' (1.524 m)  Weight:    116.166 kg (256 lb 1.6 oz)  SpO2: 94% 95% 95% 94%     General:  Obese and not in distress.  Eyes: Anicteric. No pallor.  ENT: No discharge from ears eyes nose and mouth.  Neck: JVD mildly elevated no mass felt.  Cardiovascular: S1 and S2 heard.  Respiratory: No rhonchi or crepitations.  Abdomen: Soft nontender bowel sounds present.  Skin: No rash.  Musculoskeletal: Mild edema.  Psychiatric: Appears normal.  Neurologic: Alert awake oriented to time place and person. Moves all extremities.  Labs on Admission:  Basic Metabolic Panel:  Recent Labs Lab 06/22/15 1744  NA 143  K 3.9  CL 109  GLUCOSE 88  BUN 18  CREATININE 1.00   Liver Function Tests: No results for input(s): AST, ALT, ALKPHOS, BILITOT, PROT, ALBUMIN in the last 168 hours. No results for input(s):  LIPASE, AMYLASE in the last 168 hours. No results for input(s): AMMONIA in the last 168 hours. CBC:  Recent Labs Lab 06/22/15 1743 06/22/15 1744  WBC 6.7  --   HGB 10.1* 11.6*  HCT 33.3* 34.0*  MCV 84.5  --   PLT 350  --    Cardiac Enzymes:  Recent Labs Lab 06/22/15 1743  TROPONINI <0.03    BNP (last 3 results)  Recent Labs  06/22/15 1743  BNP 147.9*    ProBNP (last 3 results) No results for input(s): PROBNP in the last 8760 hours.  CBG: No results for input(s): GLUCAP in the last 168 hours.  Radiological Exams on Admission: Dg Chest 2 View  06/22/2015   CLINICAL DATA:  Shortness of breath and wheezing 1 week.  EXAM: CHEST  2 VIEW  COMPARISON:  None.  FINDINGS: Sternotomy wires are present. Lungs are adequately inflated and demonstrate prominence of the perihilar markings bilaterally likely mild interstitial edema. There is linear density over the left midlung likely atelectasis. No evidence of effusion or pneumothorax. There is mild cardiomegaly. There are mild degenerate changes of the spine.  IMPRESSION: Mild cardiomegaly with evidence of mild vascular congestion/edema. Linear atelectasis left midlung.   Electronically Signed   By: Marin Olp M.D.   On: 06/22/2015 18:15   Ct Angio Chest Pe W/cm &/or Wo Cm  06/22/2015   CLINICAL DATA:  Dyspnea/short of breath. Hypoxia. Recent surgery. Productive cough. Fever. Chills. RIGHT-sided pain.  EXAM: CT ANGIOGRAPHY CHEST WITH CONTRAST  TECHNIQUE: Multidetector CT imaging of the chest was performed using the standard protocol during bolus administration of intravenous contrast. Multiplanar CT image reconstructions and MIPs were obtained to evaluate the vascular anatomy.  CONTRAST:  165mL OMNIPAQUE IOHEXOL 350 MG/ML SOLN  COMPARISON:  Chest radiograph 06/22/2015.  FINDINGS: Bones: Lower cervical spondylosis partially visible. No aggressive osseous lesions. Median sternotomy.  Cardiovascular: Aortic atherosclerosis. Technically  adequate study for evaluation of pulmonary embolism. No pulmonary embolus is present. No acute aortic abnormality.  Lungs: Interlobular septal thickening is present and there are areas of atelectasis along with mosaic attenuation of the lungs. Most of the areas of airspace opacification appear linear. The overall picture is compatible with CHF and pulmonary edema. Pneumonia is unlikely based on the pattern.  Central airways: Patent.  Effusions: None.  Lymphadenopathy: None.  Esophagus: Small hiatal hernia.  Upper abdomen: No acute abnormality.  Other: Thyroidectomy.  Review of the MIP images confirms the above findings.  IMPRESSION: 1. Negative for pulmonary embolus or acute aortic abnormality. 2. Ground-glass attenuation in the lungs with scattered areas of airspace opacification and prominent atelectasis. Findings are most compatible with pulmonary edema and CHF. Pneumonia is unlikely.   Electronically Signed   By: Dereck Ligas M.D.   On: 06/22/2015 19:54    EKG: Independently reviewed. Normal sinus rhythm.  Assessment/Plan Principal Problem:  Pulmonary edema Active Problems:   Essential hypertension   Chronic anemia   Post-operative hypothyroidism   1. Pulmonary edema - suspect patient probably may be having diastolic CHF. Check 2-D echo. I have placed patient on Lasix 40 mg IV daily. Closely follow intake output and daily weights. Patient is on ace inhibitors. 2. Hypertension - continue lisinopril. 3. Postoperative hypothyroidism - on Synthroid. 4. Chronic anemia - follow CBC. Patient states she takes iron pills. 5. 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 May need to get records from [facility].  I have reviewed patient's x-rays personally.   DVT Prophylaxis Lovenox.  Code Status: Full code.  Family Communication: Discussed with patient.  Disposition Plan: Admit to inpatient.    Julaine Zimny N. [facility] Pager  [phone].  If 7PM-7AM, please contact night-coverage [email or web address] Password [email or web address] 06/23/2015, 3:00 AM

## 2015-06-23 NOTE — Evaluation (Signed)
Occupational Therapy Evaluation Patient Details Name: Tonya Hogan MRN: [id number] DOB: Jan 29, 1951 Today's Date: 06/23/2015    History of Present Illness 64 y.o. 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 Patient has been feeling short of breath over the last 1 week with exertion. Denies any productive cough fever or chills. 2 weeks ago patient had chest pain which was self-limited retrosternal pressure-like. Patient still has some pain in the left lower rib area which is tender to touch. CT angiogram of the chest done shows features concerning for pulmonary edema and patient was given 40 mg IV Lasix in the ER following which patient has had good diuresis.   Clinical Impression   Pt admitted with above. Pt independent with ADLs, PTA. Feel pt will benefit from acute OT to increase independence, strength, and activity tolerance prior to d/c.    Follow Up Recommendations  No OT follow up;Supervision - Intermittent    Equipment Recommendations  Tub/shower bench    Recommendations for Other Services Other (comment) (social work consult)     Precautions / Restrictions Precautions Precautions: Fall Precaution Comments: monitor O2 Restrictions Weight Bearing Restrictions: No      Mobility Bed Mobility      General bed mobility comments: not assessed  Transfers Overall transfer level: Needs assistance Transfers: Sit to/from Stand Sit to Stand: Supervision        Balance Pt reaching for things when ambulating-min guard for ambulation. Performed activities at sink without LOB.                     ADL Overall ADL's : Needs assistance/impaired Eating/Feeding: Independent;Sitting   Grooming: Oral care;Wash/dry face;Set up;Supervision/safety;Standing       Lower Body Bathing: Minimal  assistance;Sit to/from stand Lower Body Bathing Details (indicate cue type and reason): feel like she would need assist to wash bottom thoroughly     Lower Body Dressing: Sit to/from stand;Supervision/safety;Set up   Toilet Transfer: Min guard;Ambulation (chair)           Functional mobility during ADLs: Min guard General ADL Comments: Educated on what pt could use for toilet aide for hygiene. Also, suggested a long sponge to help wash bottom. Educated on energy conservation techniques and deep breathing technique. Educated on tub transfer technique.     Vision     Perception     Praxis      Pertinent Vitals/Pain Pain Assessment: 0-10 Pain Score:  (10-left side and 3-4 on bottom) Pain Location: left side and buttocks Pain Descriptors / Indicators: Aching;Sore;Throbbing (left side also felt "hot") Pain Intervention(s): Monitored during session (notified nurse of red spot on buttocks)   Pt used 2L of O2 for part of session. O2 dropping to 84% on RA (took O2 off for part of session).  Placed O2 back on pt at end of session.      Hand Dominance     Extremity/Trunk Assessment Upper Extremity Assessment Upper Extremity Assessment: Generalized weakness (Rt shoulder flexion felt weaker than left)   Lower Extremity Assessment Lower Extremity Assessment: Defer to PT evaluation     Communication Communication Communication: No difficulties   Cognition Arousal/Alertness: Awake/alert Behavior During Therapy: WFL for tasks assessed/performed Overall Cognitive Status: Within Functional Limits for tasks assessed  General Comments       Exercises       Shoulder Instructions      Home Living Family/patient expects to be discharged to:: Private residence Living Arrangements: Children Available Help at Discharge: Family Type of Home:  (townhome) Home Access: Level entry     Home Layout: Two level Alternate Level Stairs-Number of Steps:  10-12 Alternate Level Stairs-Rails: Right Bathroom Shower/Tub: Teacher, early years/pre: Standard     Home Equipment: None          Prior Functioning/Environment Level of Independence: Independent             OT Diagnosis: Acute pain;Generalized weakness   OT Problem List: Decreased strength;Decreased range of motion;Obesity;Pain;Cardiopulmonary status limiting activity;Decreased knowledge of precautions;Decreased knowledge of use of DME or AE;Impaired balance (sitting and/or standing);Decreased activity tolerance   OT Treatment/Interventions: Self-care/ADL training;DME and/or AE instruction;Energy conservation;Therapeutic exercise;Therapeutic activities;Patient/family education;Balance training    OT Goals(Current goals can be found in the care plan section) Acute Rehab OT Goals Patient Stated Goal: get back to singing OT Goal Formulation: With patient Time For Goal Achievement: 06/30/15 Potential to Achieve Goals: Good ADL Goals Pt Will Perform Upper Body Bathing: sitting;standing;with set-up Pt Will Perform Lower Body Bathing: with set-up;sit to/from stand Pt Will Perform Lower Body Dressing: with set-up;sit to/from stand Pt Will Transfer to Toilet: ambulating;with modified independence Pt Will Perform Toileting - Clothing Manipulation and hygiene: with modified independence;sit to/from stand  OT Frequency: Min 2X/week   Barriers to D/C:            Co-evaluation              End of Session Equipment Utilized During Treatment: Oxygen;Gait belt (O2 for part of session) Nurse Communication: Other (comment) (red spot on buttocks)  Activity Tolerance: Patient tolerated treatment well Patient left: in chair;with call bell/phone within reach;with nursing/sitter in room   Time: 1439-1457 OT Time Calculation (min): 18 min Charges:  OT General Charges $OT Visit: 1 Procedure OT Evaluation $Initial OT Evaluation Tier I: 1 Procedure G-Codes: OT G-codes  **NOT FOR INPATIENT CLASS** Functional Assessment Tool Used: clinical judgment Functional Limitation: Self care Self Care Current Status (J0932): At least 1 percent but less than 20 percent impaired, limited or restricted Self Care Goal Status (I7124): At least 1 percent but less than 20 percent impaired, limited or restricted  Benito Mccreedy OTR/L [phone] 06/23/2015, 3:18 PM

## 2015-06-23 NOTE — Progress Notes (Signed)
CSW informed that pt does not have air conditioning in current home and is about to move to new home that also doesn't have air conditioning.  CSW met with pt at bedside and she confirmed that current home doesn't have air conditioning but stated that new home has a window unit instead of central air and that she would have air conditioning at time of DC- states her family is working on moving everything today.  No CSW needs identified- pt does not have any questions or concerns.  CSW signing off.  Domenica Reamer, [place] Social Worker [phone]

## 2015-06-24 ENCOUNTER — Observation Stay (HOSPITAL_BASED_OUTPATIENT_CLINIC_OR_DEPARTMENT_OTHER): Payer: Medicare Other

## 2015-06-24 DIAGNOSIS — J810 Acute pulmonary edema: Secondary | ICD-10-CM | POA: Diagnosis present

## 2015-06-24 DIAGNOSIS — R0600 Dyspnea, unspecified: Secondary | ICD-10-CM | POA: Diagnosis not present

## 2015-06-24 LAB — BASIC METABOLIC PANEL
Anion gap: 7 (ref 5–15)
BUN: 16 mg/dL (ref 6–20)
CO2: 33 mmol/L — ABNORMAL HIGH (ref 22–32)
Calcium: 9.5 mg/dL (ref 8.9–10.3)
Chloride: 95 mmol/L — ABNORMAL LOW (ref 101–111)
Creatinine, Ser: 0.79 mg/dL (ref 0.44–1.00)
GFR calc non Af Amer: >60 mL/min (ref >60)
GLUCOSE: 118 mg/dL — AB (ref 65–99)
Potassium: 3.9 mmol/L (ref 3.5–5.1)
Sodium: 135 mmol/L (ref 135–145)

## 2015-06-24 LAB — CBC
HCT: 34.2 % — ABNORMAL LOW (ref 36.0–46.0)
Hemoglobin: 10.2 g/dL — ABNORMAL LOW (ref 12.0–15.0)
MCH: 24.1 pg — AB (ref 26.0–34.0)
MCHC: 29.8 g/dL — ABNORMAL LOW (ref 30.0–36.0)
MCV: 80.9 fL (ref 78.0–100.0)
Platelets: 404 10*3/uL — ABNORMAL HIGH (ref 150–400)
RBC: 4.23 MIL/uL (ref 3.87–5.11)
RDW: 20.8 % — AB (ref 11.5–15.5)
WBC: 6.7 10*3/uL (ref 4.0–10.5)

## 2015-06-24 LAB — GLUCOSE, CAPILLARY: GLUCOSE-CAPILLARY: 109 mg/dL — AB (ref 65–99)

## 2015-06-24 MED ORDER — POTASSIUM CHLORIDE CRYS ER 20 MEQ PO TBCR
40.0000 meq | EXTENDED_RELEASE_TABLET | Freq: Once | ORAL | Status: AC
  Administered 2015-06-24: 40 meq via ORAL
  Filled 2015-06-24: qty 2

## 2015-06-24 MED ORDER — FUROSEMIDE 10 MG/ML IJ SOLN
40.0000 mg | Freq: Two times a day (BID) | INTRAMUSCULAR | Status: DC
  Administered 2015-06-24 – 2015-06-26 (×5): 40 mg via INTRAVENOUS
  Filled 2015-06-24 (×6): qty 4

## 2015-06-24 NOTE — Plan of Care (Signed)
Problem: Phase I Progression Outcomes Goal: Pain controlled with appropriate interventions Outcome: Progressing Pt denies any pain this shift. Goal: OOB as tolerated unless otherwise ordered Outcome: Progressing Pt ambulating around bedroom and tolerating well. Noted with sob at intervals. Encouraged to space activities.  Problem: Phase III Progression Outcomes Goal: Fluid volume status improved Outcome: Progressing Pt continues with sob at intervals. O2 saturation in the 80s on room air, but up to mid 90s on 1.5L oxygen. Continue on oxygen to keep oxygen saturation up in the 90s.

## 2015-06-24 NOTE — Progress Notes (Signed)
Physical Therapy Treatment Patient Details Name: Tonya Hogan MRN: [id number] DOB: 1951/05/19 Today's Date: 06/24/2015    History of Present Illness 64 y.o. 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 Patient has been feeling short of breath over the last 1 week with exertion. Denies any productive cough fever or chills. 2 weeks ago patient had chest pain which was self-limited retrosternal pressure-like. Patient still has some pain in the left lower rib area which is tender to touch. CT angiogram of the chest done shows features concerning for pulmonary edema and patient was given 40 mg IV Lasix in the ER following which patient has had good diuresis.    PT Comments    Pt progressing towards physical therapy goals. Was able to perform transfers and ambulation with overall supervision for safety. Pt's family present at beginning of session and were very concerned about pt's living situation. Focus of session originally was to practice the stairs as she has an upstairs to her home, however pt declined and family states she will not be returning to this residence. Another living situation appears to be in the plans but has not been confirmed yet. Will continue to follow.   Follow Up Recommendations  Home health PT     Equipment Recommendations  Rolling walker with 5" wheels    Recommendations for Other Services       Precautions / Restrictions Precautions Precautions: Fall Precaution Comments: monitor O2 Restrictions Weight Bearing Restrictions: No    Mobility  Bed Mobility               General bed mobility comments: Pt sitting up in bedside chair when PT arrived.   Transfers Overall transfer level: Needs assistance Equipment used: Rolling walker (2 wheeled) Transfers: Sit to/from Stand Sit to  Stand: Supervision         General transfer comment: cues for hand placement.  Ambulation/Gait Ambulation/Gait assistance: Supervision Ambulation Distance (Feet): 200 Feet Assistive device: Rolling walker (2 wheeled) Gait Pattern/deviations: Step-through pattern;Decreased stride length;Trendelenburg Gait velocity: Decreased Gait velocity interpretation: Below normal speed for age/gender General Gait Details: Pt moving generally slow. Becomes tearful and stops often. Pt on 2L/min supplemental O2 and sats remained in mid-90's throughout gait training. No LOB or unsteadiness noted with RW use.    Stairs            Wheelchair Mobility    Modified Rankin (Stroke Patients Only)       Balance Overall balance assessment: Needs assistance Sitting-balance support: Feet supported;No upper extremity supported Sitting balance-Leahy Scale: Good     Standing balance support: No upper extremity supported Standing balance-Leahy Scale: Fair Standing balance comment: Pt able to stand at sink without UE support and wash hands. Maintained balance as she leaned for the soap and paper towels                    Cognition Arousal/Alertness: Awake/alert Behavior During Therapy: WFL for tasks assessed/performed (tearful/emotional throughout session) Overall Cognitive Status: Within Functional Limits for tasks assessed                      Exercises      General Comments        Pertinent Vitals/Pain Pain Assessment: Faces Pain Score: 5  Faces Pain Scale: Hurts a little bit Pain Location: L side Pain  Descriptors / Indicators: Aching Pain Intervention(s): Limited activity within patient's tolerance;Monitored during session;Repositioned    Home Living                      Prior Function            PT Goals (current goals can now be found in the care plan section) Acute Rehab PT Goals Patient Stated Goal: not stated PT Goal Formulation: With  patient Time For Goal Achievement: 06/30/15 Potential to Achieve Goals: Good Progress towards PT goals: Progressing toward goals    Frequency  Min 3X/week    PT Plan Current plan remains appropriate    Co-evaluation             End of Session Equipment Utilized During Treatment: Oxygen Activity Tolerance: Patient tolerated treatment well Patient left: in chair;with call bell/phone within reach;Other (comment) (OT coming in)     Time: 1126-1153 PT Time Calculation (min) (ACUTE ONLY): 27 min  Charges:  $Gait Training: 8-22 mins $Therapeutic Activity: 8-22 mins                    G Codes:      Rolinda Roan 07-14-2015, 1:22 PM   Rolinda Roan, PT, DPT Acute Rehabilitation Services Pager: [phone]

## 2015-06-24 NOTE — Progress Notes (Signed)
[facility] PROGRESS NOTE  Anjannette Gauger IDP:[id number] DOB: [birth year] DOA: 06/22/2015  PCP: Patient patient's primary care physician in [facility].  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 She underwent hemorrhoidectomy last week. Patient was evaluated in the emergency department. CT angiogram of the chest did not show any pulmonary embolism but did show pulmonary edema. Patient was hospitalized for further management.  Past medical history:  Past Medical History  Diagnosis Date  . Depression   . Hypertension   . Thyroid disease   . Thyroid ca   . S/P CABG x 3   . Arthritis   . Obesity     Consultants: None  Procedures:  2-D echocardiogram is pending  Antibiotics: None  Subjective: Patient continues to feel better. Still gets short of breath with exertion. Oxygen saturation drops into the 80s. Denies any chest pain, nausea or vomiting. Swelling in the lower extremities is improving.   Objective: Vital Signs  Filed Vitals:   06/23/15 1421 06/23/15 1457 06/23/15 1941 06/24/15 0427  BP: 133/77  115/54 129/51  Pulse: 80  81 79  Temp: 99 F (37.2 C)  98 F (36.7 C) 97.8 F (36.6 C)  TempSrc: Oral  Oral Oral  Resp: 17  16 18   Height:      Weight:    115.8 kg (255 lb 4.7 oz)  SpO2: 93% 85% 97% 95%    Intake/Output Summary (Last 24 hours) at 06/24/15 0835 Last data filed at 06/24/15 0455  Gross per 24 hour  Intake    480 ml  Output   2100 ml  Net  -1620 ml   Filed Weights   06/22/15 1727 06/23/15 0105 06/24/15 0427  Weight: 117.935 kg (260 lb) 116.166 kg (256 lb 1.6 oz) 115.8 kg (255 lb 4.7 oz)    General appearance: alert, cooperative, appears stated age and no distress Resp: No wheezing heard today. Few crackles at the bases. Improved air entry compared to yesterday.  Cardio:  regular rate and rhythm, S1, S2 normal, no murmur, click, rub or gallop GI: soft, non-tender; bowel sounds normal; no masses,  no organomegaly Extremities: Pedal edema noted bilateral lower extremities. Appears to be improving. Neurologic: No focal deficits  Lab Results:  Basic Metabolic Panel:  Recent Labs Lab 06/22/15 1744 06/23/15 0421 06/24/15 0341  NA 143 141 135  K 3.9 3.3* 3.9  CL 109 102 95*  CO2  --  36* 33*  GLUCOSE 88 101* 118*  BUN 18 9 16   CREATININE 1.00 0.84 0.79  CALCIUM  --  8.7* 9.5   Liver Function Tests:  Recent Labs Lab 06/23/15 0421  AST 13*  ALT 12*  ALKPHOS 61  BILITOT 0.4  PROT 7.6  ALBUMIN 3.1*    CBC:  Recent Labs Lab 06/22/15 1743 06/22/15 1744 06/23/15 0421 06/24/15 0341  WBC 6.7  --  5.8 6.7  NEUTROABS  --   --  2.9  --   HGB 10.1* 11.6* 9.4* 10.2*  HCT 33.3* 34.0* 32.2* 34.2*  MCV 84.5  --  82.8 80.9  PLT 350  --  301 404*   Cardiac Enzymes:  Recent Labs Lab 06/22/15 1743 06/23/15 0421 06/23/15 0843 06/23/15 1505  TROPONINI <0.03 <0.03 <0.03 <0.03   BNP (last 3 results)  Recent Labs  06/22/15 1743  BNP 147.9*     Studies/Results: Dg Chest 2 View  06/22/2015   CLINICAL DATA:  Shortness of breath and wheezing 1 week.  EXAM: CHEST  2 VIEW  COMPARISON:  None.  FINDINGS: Sternotomy wires are present. Lungs are adequately inflated and demonstrate prominence of the perihilar markings bilaterally likely mild interstitial edema. There is linear density over the left midlung likely atelectasis. No evidence of effusion or pneumothorax. There is mild cardiomegaly. There are mild degenerate changes of the spine.  IMPRESSION: Mild cardiomegaly with evidence of mild vascular congestion/edema. Linear atelectasis left midlung.   Electronically Signed   By: Marin Olp M.D.   On: 06/22/2015 18:15   Ct Angio Chest Pe W/cm &/or Wo Cm  06/22/2015   CLINICAL DATA:  Dyspnea/short of breath. Hypoxia. Recent surgery. Productive cough.  Fever. Chills. RIGHT-sided pain.  EXAM: CT ANGIOGRAPHY CHEST WITH CONTRAST  TECHNIQUE: Multidetector CT imaging of the chest was performed using the standard protocol during bolus administration of intravenous contrast. Multiplanar CT image reconstructions and MIPs were obtained to evaluate the vascular anatomy.  CONTRAST:  166mL OMNIPAQUE IOHEXOL 350 MG/ML SOLN  COMPARISON:  Chest radiograph 06/22/2015.  FINDINGS: Bones: Lower cervical spondylosis partially visible. No aggressive osseous lesions. Median sternotomy.  Cardiovascular: Aortic atherosclerosis. Technically adequate study for evaluation of pulmonary embolism. No pulmonary embolus is present. No acute aortic abnormality.  Lungs: Interlobular septal thickening is present and there are areas of atelectasis along with mosaic attenuation of the lungs. Most of the areas of airspace opacification appear linear. The overall picture is compatible with CHF and pulmonary edema. Pneumonia is unlikely based on the pattern.  Central airways: Patent.  Effusions: None.  Lymphadenopathy: None.  Esophagus: Small hiatal hernia.  Upper abdomen: No acute abnormality.  Other: Thyroidectomy.  Review of the MIP images confirms the above findings.  IMPRESSION: 1. Negative for pulmonary embolus or acute aortic abnormality. 2. Ground-glass attenuation in the lungs with scattered areas of airspace opacification and prominent atelectasis. Findings are most compatible with pulmonary edema and CHF. Pneumonia is unlikely.   Electronically Signed   By: Dereck Ligas M.D.   On: 06/22/2015 19:54    Medications:  Scheduled: . citalopram  10 mg Oral Daily  . enoxaparin (LOVENOX) injection  60 mg Subcutaneous Q24H  . furosemide  40 mg Intravenous Q12H  . gabapentin  200 mg Oral QHS  . hydrochlorothiazide  12.5 mg Oral Daily  . levothyroxine  125 mcg Oral QAC breakfast  . lisinopril  20 mg Oral Daily  . potassium chloride  40 mEq Oral Once  . predniSONE  40 mg Oral BID WC  .  sodium chloride  3 mL Intravenous Q12H   Continuous:  DJS:HFWYOVZCHYIFO **OR** acetaminophen, albuterol, hydrOXYzine, ondansetron **OR** ondansetron (ZOFRAN) IV, oxyCODONE  Assessment/Plan:  Principal Problem:   Pulmonary edema Active Problems:   Essential hypertension   Chronic anemia   Post-operative hypothyroidism   Shortness of breath    Acute pulmonary edema with hypoxia No known history of congestive heart failure. Echocardiogram is pending. Continue IV Lasix. Patient still getting dyspneic and hypoxic with exertion. We'll increase Lasix to twice daily. Strict ins and outs. Daily weights. Potassium is improved.   Bilateral wheezing Improved today. Continue steroids for now. There could be an element of superimposed bronchitis as well. Started on steroids. Nebulizers as needed.  History of essential hypertension Continue medications. Monitor blood pressures closely.  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 She does not follow up with any cardiologist or cardiothoracic surgeon at this time. Follow-up on echocardiogram.  Chronic anemia Monitor hemoglobin.   DVT Prophylaxis: Lovenox    Code Status: Full code  Family Communication: Discussed with the patient  Disposition Plan: Await improvement in symptoms. Await echocardiogram. May need home oxygen, depending on her clinical progress.    LOS: 2 days   [facility] Pager [phone] 06/24/2015, 8:35 AM  If 7PM-7AM, please contact night-coverage at [email or web address], password [facility]

## 2015-06-24 NOTE — Plan of Care (Signed)
Problem: Phase I Progression Outcomes Goal: Pain controlled with appropriate interventions Outcome: Progressing Patient denies any pain at this time.

## 2015-06-24 NOTE — Care Management Note (Signed)
Case Management Note  Patient Details  Name: Tonya Hogan MRN: [id number] Date of Birth: 12/17/1950  Subjective/Objective:     Pt admitted with Pulmonary Edema               Action/Plan:  Pt is from home with daughter, daughter is in the process of moving pt into town home, daughter will live there also.  CM will continue to monitor for disposition needs   Expected Discharge Date:                  Expected Discharge Plan:  [place]  In-House Referral:  Clinical Social Work  Discharge planning Services  CM Consult  Post Acute Care Choice:    Choice offered to:  Patient  DME Arranged:  Walker rolling, Shower stool DME Agency:  [place]:  RN [facility] Agency:  [place]  Status of Service:  In process, will continue to follow  Medicare Important Message Given:    Date Medicare IM Given:    Medicare IM give by:    Date Additional Medicare IM Given:    Additional Medicare Important Message give by:     If discussed at [place] of Stay Meetings, dates discussed:    Additional Comments: CM assessed pt, pt stated when she is discharge she will be moved into a new home.  New address : [place] [address], phone number in epic (daughters mobile)  remains accurate.  Pt offered choice, pt chose [facility], both HH and DME for [facility] contacted,  referral was accepted, agency advised of address change.  CM contacted Daphne RN with HF team for HF screening. Maryclare Labrador, RN 06/24/2015, 3:41 PM

## 2015-06-24 NOTE — Progress Notes (Signed)
*  PRELIMINARY RESULTS* Echocardiogram 2D Echocardiogram has been performed.  Leavy Cella 06/24/2015, 3:56 PM

## 2015-06-24 NOTE — Progress Notes (Addendum)
Occupational Therapy Treatment Patient Details Name: Tonya Hogan MRN: [id number] DOB: [birth year] Today's Date: 06/24/2015    History of present illness 64 y.o. 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 Patient has been feeling short of breath over the last 1 week with exertion. Denies any productive cough fever or chills. 2 weeks ago patient had chest pain which was self-limited retrosternal pressure-like. Patient still has some pain in the left lower rib area which is tender to touch. CT angiogram of the chest done shows features concerning for pulmonary edema and patient was given 40 mg IV Lasix in the ER following which patient has had good diuresis.   OT comments  Education provided in session. Pt tearful/emotional in session. O2 dropped to 80s during session on 2L but trended up to 90s with deep breathing/took break.   Follow Up Recommendations  No OT follow up;Supervision - Intermittent    Equipment Recommendations  Tub/shower bench; may benefit from toilet aide   Recommendations for Other Services      Precautions / Restrictions Precautions Precautions: Fall Precaution Comments: monitor O2 Restrictions Weight Bearing Restrictions: No       Mobility Bed Mobility               General bed mobility comments: not assessed  Transfers Overall transfer level: Needs assistance   Transfers: Sit to/from Stand Sit to Stand: Supervision         General transfer comment: cues for hand placement.    Balance  No LOB in session. Used RW for ambulation.                                 ADL Overall ADL's : Needs assistance/impaired Eating/Feeding: Independent Eating/Feeding Details (indicate cue type and reason): drank water Grooming: Wash/dry hands;Wash/dry face;Oral  care;Set up;Supervision/safety;Standing (applied lotion)               Lower Body Dressing: Sit to/from stand;Supervision/safety;Set up (donned mesh panties and pad)   Toilet Transfer: Ambulation;RW;Supervision/safety;Min guard (chair)           Functional mobility during ADLs: Min guard;Rolling walker;Supervision/safety-ambulated in hallway as pt wanted to walk General ADL Comments: Reviewed what pt could use for toilet aide and suggested baby wipes. Educated on energy conservation and breathing technique. Educated on safety such as use of bag on walker.  Educated on tub transfer technique with tub bench (explained).OT provided encouragement to pt in session. Recommended pt not get down in tub.       Vision                     Perception     Praxis      Cognition  Awake/Alert Behavior During Therapy: WFL for tasks assessed/performed (emotional/tearful) Overall Cognitive Status: Within Functional Limits for tasks assessed                       Extremity/Trunk Assessment               Exercises     Shoulder Instructions       General Comments      Pertinent Vitals/ Pain       Pain Assessment: 0-10 Pain Score: 5  Pain Location: chest Pain Descriptors / Indicators: Hervey Ard  Pain Intervention(s): Monitored during session (notified nurse)   Pt on around 1.5L of O2 for part of session. Placed pt on 2L of O2 for ambulation in hallway. O2 dropped to 80s when ambulating in hallway, but trended up to 90s with deep breathing/break.  Home Living                                          Prior Functioning/Environment              Frequency Min 2X/week     Progress Toward Goals  OT Goals(current goals can now be found in the care plan section)  Progress towards OT goals: Progressing toward goals  Acute Rehab OT Goals Patient Stated Goal: not stated OT Goal Formulation: With patient Time For Goal Achievement:  06/30/15 Potential to Achieve Goals: Good ADL Goals Pt Will Perform Upper Body Bathing: sitting;standing;with set-up Pt Will Perform Lower Body Bathing: with set-up;sit to/from stand Pt Will Perform Lower Body Dressing: with set-up;sit to/from stand Pt Will Transfer to Toilet: ambulating;with modified independence Pt Will Perform Toileting - Clothing Manipulation and hygiene: with modified independence;sit to/from stand OT Additional ADL goal: Pt will independently verbalize 3/3 energy conservation techniques and utilize during session.  Plan Discharge plan remains appropriate    Co-evaluation                 End of Session Equipment Utilized During Treatment: Oxygen;Gait belt;Rolling walker   Activity Tolerance Patient tolerated treatment well   Patient Left in chair;with family/visitor present   Nurse Communication Mobility status;Other (comment) (O2 sats; chest pain)        Time: 7943-2761 OT Time Calculation (min): 28 min  Charges: OT General Charges $OT Visit: 1 Procedure OT Treatments $Self Care/Home Management : 8-22 mins $Therapeutic Activity: 8-22 mins  Benito Mccreedy OTR/L [phone] 06/24/2015, 12:49 PM

## 2015-06-25 DIAGNOSIS — E890 Postprocedural hypothyroidism: Secondary | ICD-10-CM

## 2015-06-25 DIAGNOSIS — I10 Essential (primary) hypertension: Secondary | ICD-10-CM

## 2015-06-25 DIAGNOSIS — D649 Anemia, unspecified: Secondary | ICD-10-CM

## 2015-06-25 DIAGNOSIS — J810 Acute pulmonary edema: Secondary | ICD-10-CM

## 2015-06-25 LAB — CBC
HEMATOCRIT: 37 % (ref 36.0–46.0)
HEMOGLOBIN: 10.9 g/dL — AB (ref 12.0–15.0)
MCH: 23.7 pg — ABNORMAL LOW (ref 26.0–34.0)
MCHC: 29.5 g/dL — ABNORMAL LOW (ref 30.0–36.0)
MCV: 80.4 fL (ref 78.0–100.0)
Platelets: 428 10*3/uL — ABNORMAL HIGH (ref 150–400)
RBC: 4.6 MIL/uL (ref 3.87–5.11)
RDW: 20.6 % — ABNORMAL HIGH (ref 11.5–15.5)
WBC: 7.7 10*3/uL (ref 4.0–10.5)

## 2015-06-25 LAB — BASIC METABOLIC PANEL
Anion gap: 8 (ref 5–15)
BUN: 27 mg/dL — ABNORMAL HIGH (ref 6–20)
CO2: 34 mmol/L — ABNORMAL HIGH (ref 22–32)
Calcium: 9.7 mg/dL (ref 8.9–10.3)
Chloride: 96 mmol/L — ABNORMAL LOW (ref 101–111)
Creatinine, Ser: 0.97 mg/dL (ref 0.44–1.00)
GFR calc Af Amer: >60 mL/min (ref >60)
GFR calc non Af Amer: >60 mL/min (ref >60)
Glucose, Bld: 110 mg/dL — ABNORMAL HIGH (ref 65–99)
POTASSIUM: 4.3 mmol/L (ref 3.5–5.1)
Sodium: 138 mmol/L (ref 135–145)

## 2015-06-25 NOTE — Progress Notes (Addendum)
[facility] PROGRESS NOTE  Tonya Hogan SFK:[id number] DOB: Apr 17, 1951 DOA: 06/22/2015 PCP: No PCP Per Patient   Brief HPI: 64 year old African-American female with past medical history of hypertension, hypothyroidism, previous open heart surgery for questionable cardiac tumor, presented with complaints of shortness of breath. She underwent hemorrhoidectomy last week. Patient was evaluated in the emergency department. CT angiogram of the chest did not show any pulmonary embolism but did show pulmonary edema. Patient was hospitalized for further management.  Assessment/Plan: Acute pulmonary edema with hypoxia No known history of congestive heart failure. Echocardiogram with normal LVEF but grade 2 diastolic dysfunction. Continue on IV Lasix twice daily. Strict ins and outs. Daily weights. Potassium is improved.  -Dry wt around 115kg. Wt today 144.8kg  Bilateral wheezing, likely COPD exacerbation Improved. Continue steroids for now. There could be an element of superimposed bronchitis as well. Started on steroids. Nebulizers as needed.  History of essential hypertension Continue medications. Monitor blood pressures closely.  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 She does not follow up with any cardiologist or cardiothoracic surgeon at this time.  Chronic anemia Monitor hemoglobin. Stable  Code Status: Full Family Communication: Pt in room (indicate person spoken with, relationship, and if by phone, the number) Disposition Plan: Pending   Consultants:    Procedures:    Antibiotics:    HPI/Subjective: Reports generalized pain.   Objective: Filed Vitals:   06/24/15 1500 06/24/15 1952 06/25/15 0444 06/25/15 1504  BP: 109/59 123/58 117/63 103/66  Pulse: 78 75 72 86  Temp: 97.8 F (36.6 C) 97.8 F (36.6 C) 98 F (36.7  C) 98.4 F (36.9 C)  TempSrc: Oral Oral Oral Oral  Resp: 18 18 18 19   Height:      Weight:   114.896 kg (253 lb 4.8 oz)   SpO2: 91% 94% 94% 92%    Intake/Output Summary (Last 24 hours) at 06/25/15 1544 Last data filed at 06/25/15 1125  Gross per 24 hour  Intake    562 ml  Output   1850 ml  Net  -1288 ml   Filed Weights   06/23/15 0105 06/24/15 0427 06/25/15 0444  Weight: 116.166 kg (256 lb 1.6 oz) 115.8 kg (255 lb 4.7 oz) 114.896 kg (253 lb 4.8 oz)    Exam:   General:  Awake, in nad  Cardiovascular: regular, s1, s2  Respiratory: normal resp effort, no wheezing  Abdomen: soft, nondistended  Musculoskeletal: perfused, no clubbing   Data Reviewed: Basic Metabolic Panel:  Recent Labs Lab 06/22/15 1744 06/23/15 0421 06/24/15 0341 06/25/15 0501  NA 143 141 135 138  K 3.9 3.3* 3.9 4.3  CL 109 102 95* 96*  CO2  --  36* 33* 34*  GLUCOSE 88 101* 118* 110*  BUN 18 9 16  27*  CREATININE 1.00 0.84 0.79 0.97  CALCIUM  --  8.7* 9.5 9.7   Liver Function Tests:  Recent Labs Lab 06/23/15 0421  AST 13*  ALT 12*  ALKPHOS 61  BILITOT 0.4  PROT 7.6  ALBUMIN 3.1*   No results for input(s): LIPASE, AMYLASE in the last 168 hours. No results for input(s): AMMONIA in the last 168 hours. CBC:  Recent Labs Lab 06/22/15 1743 06/22/15 1744 06/23/15 0421 06/24/15 0341 06/25/15 0501  WBC 6.7  --  5.8 6.7 7.7  NEUTROABS  --   --  2.9  --   --  HGB 10.1* 11.6* 9.4* 10.2* 10.9*  HCT 33.3* 34.0* 32.2* 34.2* 37.0  MCV 84.5  --  82.8 80.9 80.4  PLT 350  --  301 404* 428*   Cardiac Enzymes:  Recent Labs Lab 06/22/15 1743 06/23/15 0421 06/23/15 0843 06/23/15 1505  TROPONINI <0.03 <0.03 <0.03 <0.03   BNP (last 3 results)  Recent Labs  06/22/15 1743  BNP 147.9*    ProBNP (last 3 results) No results for input(s): PROBNP in the last 8760 hours.  CBG:  Recent Labs Lab 06/24/15 0556  GLUCAP 109*    No results found for this or any previous visit (from  the past 240 hour(s)).   Studies: No results found.  Scheduled Meds: . citalopram  10 mg Oral Daily  . enoxaparin (LOVENOX) injection  60 mg Subcutaneous Q24H  . furosemide  40 mg Intravenous Q12H  . gabapentin  200 mg Oral QHS  . hydrochlorothiazide  12.5 mg Oral Daily  . levothyroxine  125 mcg Oral QAC breakfast  . lisinopril  20 mg Oral Daily  . predniSONE  40 mg Oral BID WC  . sodium chloride  3 mL Intravenous Q12H   Continuous Infusions:   Principal Problem:   Pulmonary edema Active Problems:   Essential hypertension   Chronic anemia   Post-operative hypothyroidism   Shortness of breath   Acute pulmonary edema   CHIU, STEPHEN K  [facility] Pager [phone]. If 7PM-7AM, please contact night-coverage at [email or web address], password [facility] 06/25/2015, 3:44 PM  LOS: 3 days

## 2015-06-25 NOTE — Progress Notes (Signed)
Spoke with pt briefly about DME and pt planning to sponge bathe for now and thinks her family member has a chair she can sit on for this. Do not feel pt needs tub bench for home.   Roseanne Reno OTR/L [phone]

## 2015-06-25 NOTE — Care Management Important Message (Signed)
Important Message  Patient Details  Name: Tonya Hogan MRN: [id number] Date of Birth: 1951/02/24   Medicare Important Message Given:  Yes-second notification given    Pricilla Handler 06/25/2015, 10:47 AM

## 2015-06-25 NOTE — Progress Notes (Addendum)
Occupational Therapy Treatment Patient Details Name: Tonya Hogan MRN: [id number] DOB: February 23, 1951 Today's Date: 06/25/2015    History of present illness 64 y.o. 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 Patient has been feeling short of breath over the last 1 week with exertion. Denies any productive cough fever or chills. 2 weeks ago patient had chest pain which was self-limited retrosternal pressure-like. Patient still has some pain in the left lower rib area which is tender to touch. CT angiogram of the chest done shows features concerning for pulmonary edema and patient was given 40 mg IV Lasix in the ER following which patient has had good diuresis.   OT comments  Education provided in session. Pt progressing.   Follow Up Recommendations  No OT follow up;Supervision - Intermittent    Equipment Recommendations  Tub/shower bench    Recommendations for Other Services      Precautions / Restrictions Precautions Precautions: Fall Precaution Comments: monitor O2 Restrictions Weight Bearing Restrictions: No       Mobility Bed Mobility Overal bed mobility: Modified Independent             General bed mobility comments: sit to supine and scooted HOB  Transfers Overall transfer level: Needs assistance   Transfers: Sit to/from Stand Sit to Stand: Supervision              Balance    No LOB in session. Used RW for ambulation.                               ADL Overall ADL's : Needs assistance/impaired     Grooming: Wash/dry face;Applying deodorant;Set up;Supervision/safety;Sitting;Standing (applied lotion)   Upper Body Bathing: Set up;Supervision/ safety;Standing   Lower Body Bathing: Supervison/ safety;Sit to/from stand;Set up   Upper Body Dressing : Set  up;Sitting;Standing;Supervision/safety   Lower Body Dressing: Set up;Supervision/safety;With adaptive equipment;Sit to/from stand (donned/doffed socks and panties; applied pad to panties)   Toilet Transfer: Supervision/safety;RW;Ambulation (sit to stand from chair)           Functional mobility during ADLs: Supervision/safety;Rolling walker-pt wanted to walk so ambulated in hallway General ADL Comments: Educated on energy conservation. Educated on safety such as use of bag on walker and safe footwear. Educated on tub transfer techniques and also gave her tub transfer handout. Pt seems to have confusing living situation, and may be at one place for short period of time and plans to sponge bathe there, but plans to move somewhere else soon after. Pt talkative during session-OT tried to provide therapeutic listening but also tried to keep pt on task. Educated on AE and gave pt AE kit from supply.  Talked with case manage about DME recommendation/living situation.      Vision                     Perception     Praxis      Cognition  Awake/Alert Behavior During Therapy: WFL for tasks assessed/performed Overall Cognitive Status: Within Functional Limits for tasks assessed  -Decreased short-term memory                       Extremity/Trunk Assessment               Exercises  Shoulder Instructions       General Comments      Pertinent Vitals/ Pain       Pain Assessment: 0-10 Pain Score: 10-Worst pain ever Pain Location: left side and chest; 10 in side Pain Descriptors / Indicators: Sharp;Aching Pain Intervention(s): Monitored during session   O2 dropped to 80s with pt on O2, but trended up nicely to 90s. Pt used 2L of O2 for ambulation and 1.5L of O2 in room.  Home Living                                          Prior Functioning/Environment              Frequency Min 2X/week     Progress Toward Goals  OT Goals(current  goals can now be found in the care plan section)  Progress towards OT goals: Progressing toward goals  Acute Rehab OT Goals Patient Stated Goal: to sing for the president OT Goal Formulation: With patient Time For Goal Achievement: 06/30/15 Potential to Achieve Goals: Good ADL Goals Pt Will Perform Upper Body Bathing: sitting;standing;with set-up Pt Will Perform Lower Body Bathing: with set-up;sit to/from stand Pt Will Perform Lower Body Dressing: with set-up;sit to/from stand Pt Will Transfer to Toilet: ambulating;with modified independence Pt Will Perform Toileting - Clothing Manipulation and hygiene: with modified independence;sit to/from stand Additional ADL Goal #1: Pt will independently verbalize 3/3 energy conservation techniques and utilize during session.   Plan Discharge plan remains appropriate    Co-evaluation                 End of Session Equipment Utilized During Treatment: Gait belt;Rolling walker;Oxygen   Activity Tolerance Patient tolerated treatment well   Patient Left in bed;with call bell/phone within reach   Nurse Communication Other (comment) (talked with tech about walking pt later and mobility status)        Time: 1191-4782 OT Time Calculation (min): 48 min  Charges: OT General Charges $OT Visit: 1 Procedure OT Treatments $Self Care/Home Management : 23-37 mins $Therapeutic Activity: 8-22 mins  Benito Mccreedy OTR/L [phone] 06/25/2015, 11:14 AM

## 2015-06-25 NOTE — Care Management Note (Signed)
Case Management Note  Patient Details  Name: Tonya Hogan MRN: [id number] Date of Birth: 15-Dec-1950  Subjective/Objective:     Pt admitted with Pulmonary Edema               Action/Plan:  Pt is from home with daughter, daughter is in the process of moving pt into town home, daughter will live there also.  CM will continue to monitor for disposition needs   Expected Discharge Date:                  Expected Discharge Plan:  [place]  In-House Referral:  Clinical Social Work  Discharge planning Services  CM Consult  Post Acute Care Choice:    Choice offered to:  Patient  DME Arranged:  Walker rolling, Shower stool DME Agency:  [place]:  RN [facility] Agency:  [place]  Status of Service:  In process, will continue to follow  Medicare Important Message Given:    Date Medicare IM Given:    Medicare IM give by:    Date Additional Medicare IM Given:    Additional Medicare Important Message give by:     If discussed at [place] of Stay Meetings, dates discussed:    Additional Comments: 06/25/15 Tonya Quinones, RN, BSN [phone] CM was informed during progression rounds that pts living situation is not finalized as previously determined, per bedside nurse pt will have to discharge to hotel, waiting for required repairs by new Landlord.  CM talked with pt and was informed that if repairs are not made prior to discharge; pt will move in with sister in law [place] while repairs are being made, however pt would like to see CSW in case repairs are not made in a timely manner.  CSW consulted.  Pt is also on waiting list for Housing Authority in [place].  Pt confirmed she did have a safe disposition plan post discharge.  06/24/15 Tonya Quinones, RN, BSN [phone] CM assessed pt, pt stated when she is discharge she will be moved into a new home.  New address : [place] [address], phone number in epic  (daughters mobile)  remains accurate.  Pt offered choice, pt chose [facility], both HH and DME for [facility] contacted,  referral was accepted, agency advised of address change.  CM contacted Daphne RN with HF team for HF screening. Tonya Labrador, RN 06/25/2015, 10:46 AM

## 2015-06-26 DIAGNOSIS — J441 Chronic obstructive pulmonary disease with (acute) exacerbation: Secondary | ICD-10-CM

## 2015-06-26 DIAGNOSIS — J449 Chronic obstructive pulmonary disease, unspecified: Secondary | ICD-10-CM

## 2015-06-26 LAB — BASIC METABOLIC PANEL
Anion gap: 9 (ref 5–15)
BUN: 33 mg/dL — ABNORMAL HIGH (ref 6–20)
CALCIUM: 9.3 mg/dL (ref 8.9–10.3)
CO2: 36 mmol/L — AB (ref 22–32)
Chloride: 92 mmol/L — ABNORMAL LOW (ref 101–111)
Creatinine, Ser: 0.98 mg/dL (ref 0.44–1.00)
GFR calc non Af Amer: >60 mL/min (ref >60)
GLUCOSE: 105 mg/dL — AB (ref 65–99)
POTASSIUM: 3.9 mmol/L (ref 3.5–5.1)
Sodium: 137 mmol/L (ref 135–145)

## 2015-06-26 MED ORDER — OXYCODONE HCL 5 MG PO TABS: 5.0000 mg | ORAL_TABLET | ORAL | Status: DC | PRN

## 2015-06-26 MED ORDER — ALBUTEROL SULFATE HFA 108 (90 BASE) MCG/ACT IN AERS: 2.0000 | INHALATION_SPRAY | Freq: Four times a day (QID) | RESPIRATORY_TRACT | Status: AC | PRN

## 2015-06-26 MED ORDER — PREDNISONE 5 MG PO TABS
5.0000 mg | ORAL_TABLET | Freq: Every day | ORAL | Status: DC
Start: 2015-06-26 — End: 2015-10-09

## 2015-06-26 MED ORDER — FUROSEMIDE 40 MG PO TABS: 40.0000 mg | ORAL_TABLET | Freq: Every day | ORAL | Status: DC

## 2015-06-26 NOTE — Care Management Note (Addendum)
Case Management Note  Patient Details  Name: Tonya Hogan MRN: [id number] Date of Birth: 12-02-1950  Subjective/Objective:     Pt admitted with Pulmonary Edema               Action/Plan:  Pt is from home with daughter, daughter is in the process of moving pt into town home, daughter will live there also.  CM will continue to monitor for disposition needs   Expected Discharge Date:                  Expected Discharge Plan:  [place]  In-House Referral:  Clinical Social Work  Discharge planning Services  CM Consult  Post Acute Care Choice:    Choice offered to:  Patient  DME Arranged:  Conservation officer, nature , Oxygen DME Agency:  [place]:  RN, PT [facility] Agency:  [place]  Status of Service:  Complete, will sign off  Medicare Important Message Given:  Yes-second notification given Date Medicare IM Given:    Medicare IM give by:    Date Additional Medicare IM Given:    Additional Medicare Important Message give by:     If discussed at [place] of Stay Meetings, dates discussed:    Additional Comments: 06/26/15 Elenor Quinones, RN, BSN [phone] CM verified with pt discharge address.  Pt will go to the address listed below of::[address] [address], per pt; landlord fixed issues and new home is safe for discharge. CM asked MD for [facility] orders to assist pt with medical management post discharge.  Pt ambulated prior to discharge on RA, saturations 88% per PT, MD notified, Home O2 order written.  CM contacted [facility] home [facility], referral accepted for both HHRN and O2.   Pt daughter Tonya Hogan will transport pt home.  06/25/15 Elenor Quinones, RN, BSN [phone] CM was informed during progression rounds that pts living situation is not finalized as previously determined, per bedside nurse pt will have to discharge to hotel, waiting for required repairs by new Landlord.  CM talked with pt and was informed that if repairs  are not made prior to discharge; pt will move in with sister in law Tonya Hogan while repairs are being made, however pt would like to see CSW in case repairs are not made in a timely manner.  CSW consulted.  Pt is also on waiting list for Housing Authority in [place].  Pt confirmed she did have a safe disposition plan post discharge.  06/24/15 Elenor Quinones, RN, BSN [phone] CM assessed pt, pt stated when she is discharge she will be moved into a new home.  New address : [place] [address], phone number in epic (daughters mobile)  remains accurate.  Pt offered choice, pt chose [facility], both HH and DME for [facility] contacted,  referral was accepted, agency advised of address change.  CM contacted Daphne RN with HF team for HF screening. Pt unable to purchase shower stool, insurance will not cover. Maryclare Labrador, RN 06/26/2015, 9:51 AM

## 2015-06-26 NOTE — Care Management Important Message (Signed)
Important Message  Patient Details  Name: Korianna Washer MRN: [id number] Date of Birth: Jan 16, 1951   Medicare Important Message Given:  Yes-third notification given    Nathen May 06/26/2015, 11:59 AMImportant Message  Patient Details  Name: Amely Voorheis MRN: [id number] Date of Birth: Apr 29, 1951   Medicare Important Message Given:  Yes-third notification given    Nathen May 06/26/2015, 11:59 AM

## 2015-06-26 NOTE — Progress Notes (Signed)
Physical Therapy Treatment Patient Details Name: Tonya Hogan MRN: [id number] DOB: [birth year] Today's Date: 06/26/2015    History of Present Illness 64 y.o. 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 Patient has been feeling short of breath over the last 1 week with exertion. Denies any productive cough fever or chills. 2 weeks ago patient had chest pain which was self-limited retrosternal pressure-like. Patient still has some pain in the left lower rib area which is tender to touch. CT angiogram of the chest done shows features concerning for pulmonary edema and patient was given 40 mg IV Lasix in the ER following which patient has had good diuresis.    PT Comments    Pt progressing towards physical therapy goals. Was able to negotiate 8 stairs with hands-on guarding for assistance for safety. Balance appears to be improved from initial eval. Pt initially on RA when PT entered and sats ranged from 85-87% while sitting EOB. 2L/min supplemental O2 was donned as pt states she would have home O2 at d/c, and sats improved to mid-90's. CM during gait training informed PT that home O2 was not ordered at that time, and pt was taken off supplemental O2 to monitor sats. Pt continually practiced pursed-lip breathing and sats remained 88% throughout gait training. RN notified.   Follow Up Recommendations  Home health PT     Equipment Recommendations  Rolling walker with 5" wheels    Recommendations for Other Services       Precautions / Restrictions Precautions Precautions: Fall Precaution Comments: monitor O2 Restrictions Weight Bearing Restrictions: No    Mobility  Bed Mobility               General bed mobility comments: Pt sitting up on EOB when PT arrived. Sat in bedside chair at end of session to eat  lunch with RN present.   Transfers Overall transfer level: Needs assistance Equipment used: Rolling walker (2 wheeled) Transfers: Sit to/from Stand Sit to Stand: Supervision         General transfer comment: Supervision for safety. Pt demonstrated proper hand placement and safety awareness.   Ambulation/Gait Ambulation/Gait assistance: Supervision Ambulation Distance (Feet): 500 Feet Assistive device: Rolling walker (2 wheeled) Gait Pattern/deviations: Step-through pattern;Decreased stride length;Trunk flexed Gait velocity: Decreased Gait velocity interpretation: Below normal speed for age/gender General Gait Details: Pt moving generally slow and guarded with occasional standing rest breaks due to fatigue. Pt on 2L/min initially and then on RA to monitor sats. Sats dropped to 88% during pursed-lip breathing on RA.    Stairs Stairs: Yes Stairs assistance: Min guard Stair Management: One rail Right;Alternating pattern;Step to pattern;Forwards Number of Stairs: 8 General stair comments: Hands-on guarding for safety. Pt was cued for proper sequencing and technique and for pursed-lip breathing.   Wheelchair Mobility    Modified Rankin (Stroke Patients Only)       Balance Overall balance assessment: Needs assistance Sitting-balance support: Feet supported;No upper extremity supported Sitting balance-Leahy Scale: Good     Standing balance support: No upper extremity supported;During functional activity Standing balance-Leahy Scale: Fair                      Cognition Arousal/Alertness: Awake/alert Behavior During Therapy: WFL for tasks assessed/performed Overall Cognitive Status: Within Functional Limits for tasks assessed  Exercises      General Comments        Pertinent Vitals/Pain Pain Assessment: No/denies pain    Home Living                      Prior Function            PT Goals (current goals can now be  found in the care plan section) Acute Rehab PT Goals Patient Stated Goal: to sing for the president PT Goal Formulation: With patient Time For Goal Achievement: 06/30/15 Potential to Achieve Goals: Good Progress towards PT goals: Progressing toward goals    Frequency  Min 3X/week    PT Plan Current plan remains appropriate    Co-evaluation             End of Session Equipment Utilized During Treatment: Gait belt;Oxygen Activity Tolerance: Patient tolerated treatment well Patient left: in chair;with call bell/phone within reach;with nursing/sitter in room     Time: 1138-1215 PT Time Calculation (min) (ACUTE ONLY): 37 min  Charges:  $Gait Training: 8-22 mins $Therapeutic Activity: 8-22 mins                    G Codes:      Rolinda Roan 07/11/15, 1:36 PM   Rolinda Roan, PT, DPT Acute Rehabilitation Services Pager: [phone]

## 2015-06-26 NOTE — Discharge Summary (Addendum)
Physician Discharge Summary  Tonya Hogan BHA:[id number] DOB: 11/06/1951 DOA: 06/22/2015  PCP: No PCP Per Patient  Admit date: 06/22/2015 Discharge date: 06/26/2015  Time spent: 20 minutes  Recommendations for Outpatient Follow-up:  1. Follow up with PCP in 1-2 weeks 2. Repeat renal panel in 1-2 weeks  Discharge Diagnoses:  Principal Problem:   Pulmonary edema Active Problems:   Essential hypertension   Chronic anemia   Post-operative hypothyroidism   Shortness of breath   Acute pulmonary edema  Wt on discharge: 113.3kg  Discharge Condition: Improved  Diet recommendation: Heart healthy  Filed Weights   06/24/15 0427 06/25/15 0444 06/26/15 0438  Weight: 115.8 kg (255 lb 4.7 oz) 114.896 kg (253 lb 4.8 oz) 113.309 kg (249 lb 12.8 oz)    History of present illness:  Please review h and p from 8/1 for details. Briefly, pt presented with sob, with pulm edema. Pt was admitted for further work up,.  Hospital Course:  Acute pulmonary edema with hypoxia secondary to acutediastolic CHF Pt with no known prior history of congestive heart failure. Echocardiogram with normal LVEF but grade 2 diastolic dysfunction. Patient improved with IV Lasix twice daily.  -Dry wt around 115kg. Wt on d/c: 113.3kg  Bilateral wheezing, likely COPD exacerbation Improved. Continue steroids for now. There could be an element of superimposed bronchitis as well. Started on steroids. Nebulizers as needed. Would recommend outpatient PFT's  History of essential hypertension Continue medications. Monitor blood pressures closely.  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 She does not follow up with any cardiologist or cardiothoracic surgeon at this time.  Chronic anemia Monitor hemoglobin. Stable   Consultations:    Discharge Exam: Filed Vitals:   06/25/15  0444 06/25/15 1504 06/25/15 2046 06/26/15 0438  BP: 117/63 103/66 123/96 120/82  Pulse: 72 86 87 75  Temp: 98 F (36.7 C) 98.4 F (36.9 C) 98.4 F (36.9 C) 97.8 F (36.6 C)  TempSrc: Oral Oral Oral Oral  Resp: 18 19 19 19   Height:      Weight: 114.896 kg (253 lb 4.8 oz)   113.309 kg (249 lb 12.8 oz)  SpO2: 94% 92% 95% 93%    General: Awake, in nad Cardiovascular: regular, s1, s2 Respiratory: normal resp effort, no wheezing  Discharge Instructions     Medication List    STOP taking these medications        hydrochlorothiazide 12.5 MG capsule  Commonly known as:  MICROZIDE      TAKE these medications        albuterol 108 (90 BASE) MCG/ACT inhaler  Commonly known as:  PROVENTIL HFA;VENTOLIN HFA  Inhale 2 puffs into the lungs every 6 (six) hours as needed for wheezing or shortness of breath.     citalopram 10 MG tablet  Commonly known as:  CELEXA  Take 10 mg by mouth daily.     ferrous sulfate 325 (65 FE) MG tablet  Take 325 mg by mouth daily with breakfast.     furosemide 40 MG tablet  Commonly known as:  LASIX  Take 1 tablet (40 mg total) by mouth daily.     gabapentin 100 MG capsule  Commonly known as:  NEURONTIN  Take 200 mg by mouth at bedtime.     hydrOXYzine 25 MG tablet  Commonly known as:  ATARAX/VISTARIL  Take 25 mg by mouth 3 (three) times  daily as needed.     levothyroxine 125 MCG tablet  Commonly known as:  SYNTHROID, LEVOTHROID  Take 125 mcg by mouth daily before breakfast.     lisinopril 20 MG tablet  Commonly known as:  PRINIVIL,ZESTRIL  Take 20 mg by mouth daily.     oxyCODONE 5 MG immediate release tablet  Commonly known as:  Oxy IR/ROXICODONE  Take 1 tablet (5 mg total) by mouth every 4 (four) hours as needed for severe pain.     predniSONE 5 MG tablet  Commonly known as:  DELTASONE  Take 1 tablet (5 mg total) by mouth daily with breakfast.       Allergies  Allergen Reactions  . Chocolate   . Peanut-Containing Drug Products         Follow-up Information    Follow up with [place].   Why:  rolling walker, shower bench   Contact information:   [address] [place] [address] [phone]       Follow up with [place].   Why:  Phyical therapist   Contact information:   [address] [place] [address] [phone]       Schedule an appointment as soon as possible for a visit with Follow up with PCP in 1-2 weeks.   Why:  Hospital follow up       The results of significant diagnostics from this hospitalization (including imaging, microbiology, ancillary and laboratory) are listed below for reference.    Significant Diagnostic Studies: Dg Chest 2 View  06/22/2015   CLINICAL DATA:  Shortness of breath and wheezing 1 week.  EXAM: CHEST  2 VIEW  COMPARISON:  None.  FINDINGS: Sternotomy wires are present. Lungs are adequately inflated and demonstrate prominence of the perihilar markings bilaterally likely mild interstitial edema. There is linear density over the left midlung likely atelectasis. No evidence of effusion or pneumothorax. There is mild cardiomegaly. There are mild degenerate changes of the spine.  IMPRESSION: Mild cardiomegaly with evidence of mild vascular congestion/edema. Linear atelectasis left midlung.   Electronically Signed   By: Marin Olp M.D.   On: 06/22/2015 18:15   Ct Angio Chest Pe W/cm &/or Wo Cm  06/22/2015   CLINICAL DATA:  Dyspnea/short of breath. Hypoxia. Recent surgery. Productive cough. Fever. Chills. RIGHT-sided pain.  EXAM: CT ANGIOGRAPHY CHEST WITH CONTRAST  TECHNIQUE: Multidetector CT imaging of the chest was performed using the standard protocol during bolus administration of intravenous contrast. Multiplanar CT image reconstructions and MIPs were obtained to evaluate the vascular anatomy.  CONTRAST:  181mL OMNIPAQUE IOHEXOL 350 MG/ML SOLN  COMPARISON:  Chest radiograph 06/22/2015.  FINDINGS: Bones: Lower  cervical spondylosis partially visible. No aggressive osseous lesions. Median sternotomy.  Cardiovascular: Aortic atherosclerosis. Technically adequate study for evaluation of pulmonary embolism. No pulmonary embolus is present. No acute aortic abnormality.  Lungs: Interlobular septal thickening is present and there are areas of atelectasis along with mosaic attenuation of the lungs. Most of the areas of airspace opacification appear linear. The overall picture is compatible with CHF and pulmonary edema. Pneumonia is unlikely based on the pattern.  Central airways: Patent.  Effusions: None.  Lymphadenopathy: None.  Esophagus: Small hiatal hernia.  Upper abdomen: No acute abnormality.  Other: Thyroidectomy.  Review of the MIP images confirms the above findings.  IMPRESSION: 1. Negative for pulmonary embolus or acute aortic abnormality. 2. Ground-glass attenuation in the lungs with scattered areas of airspace opacification and prominent  atelectasis. Findings are most compatible with pulmonary edema and CHF. Pneumonia is unlikely.   Electronically Signed   By: Dereck Ligas M.D.   On: 06/22/2015 19:54    Microbiology: No results found for this or any previous visit (from the past 240 hour(s)).   Labs: Basic Metabolic Panel:  Recent Labs Lab 06/22/15 1744 06/23/15 0421 06/24/15 0341 06/25/15 0501 06/26/15 0349  NA 143 141 135 138 137  K 3.9 3.3* 3.9 4.3 3.9  CL 109 102 95* 96* 92*  CO2  --  36* 33* 34* 36*  GLUCOSE 88 101* 118* 110* 105*  BUN 18 9 16  27* 33*  CREATININE 1.00 0.84 0.79 0.97 0.98  CALCIUM  --  8.7* 9.5 9.7 9.3   Liver Function Tests:  Recent Labs Lab 06/23/15 0421  AST 13*  ALT 12*  ALKPHOS 61  BILITOT 0.4  PROT 7.6  ALBUMIN 3.1*   No results for input(s): LIPASE, AMYLASE in the last 168 hours. No results for input(s): AMMONIA in the last 168 hours. CBC:  Recent Labs Lab 06/22/15 1743 06/22/15 1744 06/23/15 0421 06/24/15 0341 06/25/15 0501  WBC 6.7  --   5.8 6.7 7.7  NEUTROABS  --   --  2.9  --   --   HGB 10.1* 11.6* 9.4* 10.2* 10.9*  HCT 33.3* 34.0* 32.2* 34.2* 37.0  MCV 84.5  --  82.8 80.9 80.4  PLT 350  --  301 404* 428*   Cardiac Enzymes:  Recent Labs Lab 06/22/15 1743 06/23/15 0421 06/23/15 0843 06/23/15 1505  TROPONINI <0.03 <0.03 <0.03 <0.03   BNP: BNP (last 3 results)  Recent Labs  06/22/15 1743  BNP 147.9*    ProBNP (last 3 results) No results for input(s): PROBNP in the last 8760 hours.  CBG:  Recent Labs Lab 06/24/15 0556  GLUCAP 109*    Signed:  Phuc Kluttz K  [facility] 06/26/2015, 9:44 AM

## 2015-06-26 NOTE — Progress Notes (Signed)
Saline lock and monitor removed.  The patient was educated on CHF material.  Patient voiced understanding. Patient waiting on there ride.

## 2015-06-26 NOTE — Progress Notes (Signed)
SATURATION QUALIFICATIONS: (This note is used to comply with regulatory documentation for home oxygen)  Patient Saturations on Room Air at Rest = 87 %  Please briefly explain why patient needs home oxygen: 

## 2015-07-05 ENCOUNTER — Encounter (HOSPITAL_BASED_OUTPATIENT_CLINIC_OR_DEPARTMENT_OTHER): Payer: Self-pay | Admitting: *Deleted

## 2015-07-05 ENCOUNTER — Emergency Department (HOSPITAL_BASED_OUTPATIENT_CLINIC_OR_DEPARTMENT_OTHER)
Admission: EM | Admit: 2015-07-05 | Discharge: 2015-07-08 | Disposition: A | Discharge status: Other Acute Inpatient Hospital | Payer: Medicare Other | Attending: Emergency Medicine | Admitting: Emergency Medicine

## 2015-07-05 DIAGNOSIS — F309 Manic episode, unspecified: Secondary | ICD-10-CM | POA: Diagnosis not present

## 2015-07-05 DIAGNOSIS — F319 Bipolar disorder, unspecified: Secondary | ICD-10-CM | POA: Insufficient documentation

## 2015-07-05 DIAGNOSIS — F3110 Bipolar disorder, current episode manic without psychotic features, unspecified: Secondary | ICD-10-CM | POA: Diagnosis present

## 2015-07-05 HISTORY — DX: Bipolar disorder, unspecified: F31.9

## 2015-07-05 LAB — COMPREHENSIVE METABOLIC PANEL
ALBUMIN: 3.4 g/dL — AB (ref 3.5–5.0)
ALT: 17 U/L (ref 14–54)
ANION GAP: 9 (ref 5–15)
AST: 13 U/L — ABNORMAL LOW (ref 15–41)
Alkaline Phosphatase: 66 U/L (ref 38–126)
BILIRUBIN TOTAL: 0.4 mg/dL (ref 0.3–1.2)
BUN: 19 mg/dL (ref 6–20)
CALCIUM: 8.6 mg/dL — AB (ref 8.9–10.3)
CO2: 33 mmol/L — ABNORMAL HIGH (ref 22–32)
Chloride: 101 mmol/L (ref 101–111)
Creatinine, Ser: 0.89 mg/dL (ref 0.44–1.00)
GFR calc non Af Amer: >60 mL/min (ref >60)
GLUCOSE: 89 mg/dL (ref 65–99)
Potassium: 3.3 mmol/L — ABNORMAL LOW (ref 3.5–5.1)
Sodium: 143 mmol/L (ref 135–145)
TOTAL PROTEIN: 7.7 g/dL (ref 6.5–8.1)

## 2015-07-05 LAB — CBC WITH DIFFERENTIAL/PLATELET
Basophils Absolute: 0.1 10*3/uL (ref 0.0–0.1)
Basophils Relative: 1 % (ref 0–1)
Eosinophils Absolute: 0.4 10*3/uL (ref 0.0–0.7)
Eosinophils Relative: 5 % (ref 0–5)
HEMATOCRIT: 36.2 % (ref 36.0–46.0)
HEMOGLOBIN: 10.7 g/dL — AB (ref 12.0–15.0)
LYMPHS PCT: 26 % (ref 12–46)
Lymphs Abs: 2.1 10*3/uL (ref 0.7–4.0)
MCH: 23.9 pg — ABNORMAL LOW (ref 26.0–34.0)
MCHC: 29.6 g/dL — AB (ref 30.0–36.0)
MCV: 80.8 fL (ref 78.0–100.0)
Monocytes Absolute: 1 10*3/uL (ref 0.1–1.0)
Monocytes Relative: 12 % (ref 3–12)
NEUTROS PCT: 56 % (ref 43–77)
Neutro Abs: 4.5 10*3/uL (ref 1.7–7.7)
Platelets: 357 10*3/uL (ref 150–400)
RBC: 4.48 MIL/uL (ref 3.87–5.11)
RDW: 20 % — ABNORMAL HIGH (ref 11.5–15.5)
WBC: 7.9 10*3/uL (ref 4.0–10.5)

## 2015-07-05 LAB — URINALYSIS, ROUTINE W REFLEX MICROSCOPIC
Bilirubin Urine: NEGATIVE
Glucose, UA: NEGATIVE mg/dL
Hgb urine dipstick: NEGATIVE
Ketones, ur: NEGATIVE mg/dL
Leukocytes, UA: NEGATIVE
Nitrite: NEGATIVE
Protein, ur: NEGATIVE mg/dL
Specific Gravity, Urine: 1.022 (ref 1.005–1.030)
UROBILINOGEN UA: 1 mg/dL (ref 0.0–1.0)
pH: 6 (ref 5.0–8.0)

## 2015-07-05 LAB — RAPID URINE DRUG SCREEN, HOSP PERFORMED
Amphetamines: NOT DETECTED
Barbiturates: NOT DETECTED
Benzodiazepines: NOT DETECTED
COCAINE: NOT DETECTED
Opiates: NOT DETECTED
Tetrahydrocannabinol: NOT DETECTED

## 2015-07-05 LAB — ETHANOL

## 2015-07-05 LAB — SALICYLATE LEVEL

## 2015-07-05 LAB — ACETAMINOPHEN LEVEL

## 2015-07-05 LAB — OCCULT BLOOD X 1 CARD TO LAB, STOOL: FECAL OCCULT BLD: POSITIVE — AB

## 2015-07-05 MED ORDER — SERTRALINE HCL 50 MG PO TABS
25.0000 mg | ORAL_TABLET | Freq: Every day | ORAL | Status: DC
  Administered 2015-07-06: 25 mg via ORAL
  Filled 2015-07-05: qty 1

## 2015-07-05 MED ORDER — LORAZEPAM 1 MG PO TABS
1.0000 mg | ORAL_TABLET | Freq: Three times a day (TID) | ORAL | Status: DC | PRN
  Filled 2015-07-05: qty 1

## 2015-07-05 MED ORDER — LISINOPRIL 20 MG PO TABS
20.0000 mg | ORAL_TABLET | Freq: Every day | ORAL | Status: DC
  Administered 2015-07-06 – 2015-07-08 (×3): 20 mg via ORAL
  Filled 2015-07-05 (×3): qty 1

## 2015-07-05 MED ORDER — ACETAMINOPHEN 325 MG PO TABS: 650.0000 mg | ORAL_TABLET | ORAL | Status: DC | PRN

## 2015-07-05 MED ORDER — FUROSEMIDE 40 MG PO TABS
40.0000 mg | ORAL_TABLET | Freq: Every day | ORAL | Status: DC
Start: 2015-07-06 — End: 2015-07-08
  Administered 2015-07-06 – 2015-07-08 (×3): 40 mg via ORAL
  Filled 2015-07-05 (×3): qty 1

## 2015-07-05 MED ORDER — ALBUTEROL SULFATE HFA 108 (90 BASE) MCG/ACT IN AERS: 2.0000 | INHALATION_SPRAY | Freq: Four times a day (QID) | RESPIRATORY_TRACT | Status: DC | PRN

## 2015-07-05 MED ORDER — LEVOTHYROXINE SODIUM 125 MCG PO TABS
125.0000 ug | ORAL_TABLET | Freq: Every day | ORAL | Status: DC
  Administered 2015-07-06 – 2015-07-08 (×3): 125 ug via ORAL
  Filled 2015-07-05 (×4): qty 1

## 2015-07-05 NOTE — ED Provider Notes (Signed)
She arrived from that center, [facility], calm and comfortable, as planned. She has no activities at this time. She is comfortable waiting for psychiatry to evaluate her in the morning, for "bipolar". She has appropriate orders written.  Daleen Bo, MD 07/05/15 [phone]

## 2015-07-05 NOTE — ED Notes (Addendum)
Pt reports rectal bleeding since last night- family reports pt is bi-polar and has been having an exacerbation. Pt was started on home oxygen recently

## 2015-07-05 NOTE — ED Notes (Signed)
Discussed with MD and the Patient about the transfer

## 2015-07-05 NOTE — ED Notes (Signed)
Spoke with [facility] a/c and patient is unable to go to [facility] right now. Working to transfer the patient to [place]

## 2015-07-05 NOTE — ED Notes (Signed)
Bed: WA29 Expected date:  Expected time:  Means of arrival:  Comments: [place]

## 2015-07-05 NOTE — ED Notes (Signed)
TTS on phone with the patient

## 2015-07-05 NOTE — BHH Counselor (Addendum)
2100:  Per Lynnda Child; there is no available beds at [facility] and the Patient is on O2.  Patient can be placed at [place].  2107:  Notified Collene Gobble, RN at [facility] about Patient's current disposition.

## 2015-07-05 NOTE — ED Provider Notes (Signed)
CSN: [id number]     Arrival date & time 07/05/15  1541 History  This chart was scribed for Serita Grit, MD by Rayna Sexton, ED scribe. This patient was seen in room MH06/MH06 and the patient's care was started at 4:00 PM.   Chief Complaint  Patient presents with  . Rectal Bleeding   Patient is a 64 y.o. female presenting with hematochezia. The history is provided by the patient and a relative. No language interpreter was used.  Rectal Bleeding Quality:  Bright red Amount:  Scant Duration:  3 hours Timing:  Intermittent Progression:  Unable to specify Chronicity:  Recurrent Context: hemorrhoids   Similar prior episodes: yes   Ineffective treatments:  None tried   HPI Comments: Tonya Hogan is a 64 y.o. female who presents to the Emergency Department complaining of intermittent, mild, 1x rectal bleeding with onset PTA. She notes that while having a BM earlier today and noticed bright red blood. She notes that she has been using her O2 at home and her niece denies that she has been ambulating recently with her walker. Pt notes a hx of hemorrhoids and rectal bleeding and further notes this is a similar occurrence. Pt's relative notes that she came to [facility] recently for diffuse swelling and further notes she was told she could possibly need another CABG. Since the prior visit she has been taking her Rx's. 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   Pt notes a FHx of bipolar disorder and is concerned she might also be bipolar due to intermittent hallucinations and notes "having dreams that always come true". Her niece notes that her cousin could possibly be allowing her to take a prescription sleep aid that is not prescribed to her. Pt's daughter notes that she experiences periods including hallucinations, crying and psychotic episodes. Her daughter describes her as having "split personalities" and notes that she suffers from chronic sleep deprivation and  sometimes only sleeps 12 hours per week.  Her daughter notes that she has recently been lying, stealing and wandering off and notes this is abnormal behavior. Her daughter believes that she is a danger to herself and notes her behavorial symptoms have never been this severe.   Pt's daughter notes that her current behavorial symptoms began s/p her last CABG and since this surgery she began taking Zoloft and citalopram and is now currently on Zoloft. Pt's daughter notes that since her last hospital visit that the only new medications she was taking included prednisone and oxycodone. Her daughter notes that she took a 12 day rx for prednisone in a 2 day period which was 6 days ago.    Past Medical History  Diagnosis Date  . Depression   . Hypertension   . Thyroid disease   . Thyroid ca   . S/P CABG x 3   . Arthritis   . Obesity   . Bipolar 1 disorder    Past Surgical History  Procedure Laterality Date  . Coronary artery bypass graft    . Tonsillectomy    . Abdominal hysterectomy     Family History  Problem Relation Age of Onset  . Hypertension Sister   . Bipolar disorder Other    Social History  Substance Use Topics  . Smoking status: Never Smoker   . Smokeless tobacco: Never Used  . Alcohol Use: No   OB History    No data available     Review of Systems  Gastrointestinal: Positive for hematochezia and  anal bleeding.  All other systems reviewed and are negative.  Allergies  Chocolate and Peanut-containing drug products  Home Medications   Prior to Admission medications   Medication Sig Start Date End Date Taking? Authorizing Provider  albuterol (PROVENTIL HFA;VENTOLIN HFA) 108 (90 BASE) MCG/ACT inhaler Inhale 2 puffs into the lungs every 6 (six) hours as needed for wheezing or shortness of breath. 06/26/15   Donne Hazel, MD  citalopram (CELEXA) 10 MG tablet Take 10 mg by mouth daily.    Historical Provider, MD  ferrous sulfate 325 (65 FE) MG tablet Take 325 mg by mouth  daily with breakfast.    Historical Provider, MD  furosemide (LASIX) 40 MG tablet Take 1 tablet (40 mg total) by mouth daily. 06/26/15   Donne Hazel, MD  gabapentin (NEURONTIN) 100 MG capsule Take 200 mg by mouth at bedtime.    Historical Provider, MD  hydrOXYzine (ATARAX/VISTARIL) 25 MG tablet Take 25 mg by mouth 3 (three) times daily as needed.    Historical Provider, MD  levothyroxine (SYNTHROID, LEVOTHROID) 125 MCG tablet Take 125 mcg by mouth daily before breakfast.    Historical Provider, MD  lisinopril (PRINIVIL,ZESTRIL) 20 MG tablet Take 20 mg by mouth daily.    Historical Provider, MD  oxyCODONE (OXY IR/ROXICODONE) 5 MG immediate release tablet Take 1 tablet (5 mg total) by mouth every 4 (four) hours as needed for severe pain. 06/26/15   Donne Hazel, MD  predniSONE (DELTASONE) 5 MG tablet Take 1 tablet (5 mg total) by mouth daily with breakfast. 06/26/15   Donne Hazel, MD   BP 122/99 mmHg  Pulse 88  Temp(Src) 99.4 F (37.4 C)  Resp 18  Ht 5" (0.127 m)  Wt 241 lb (109.317 kg)  BMI 6777.67 kg/m2  SpO2 94% Physical Exam  Constitutional: She is oriented to person, place, and time. She appears well-developed and well-nourished. No distress.  obese  HENT:  Head: Normocephalic and atraumatic.  Mouth/Throat: Oropharynx is clear and moist.  Eyes: Conjunctivae are normal. Pupils are equal, round, and reactive to light. No scleral icterus.  Neck: Neck supple.  Cardiovascular: Normal rate, regular rhythm, normal heart sounds and intact distal pulses.   No murmur heard. Pulmonary/Chest: Effort normal and breath sounds normal. No stridor. No respiratory distress. She has no rales.  Abdominal: Soft. Bowel sounds are normal. She exhibits no distension. There is no tenderness.  Genitourinary:  Small hemorrhoid, soft.  Tiny fleck of bright blood.  No stool in vault.  Hemorrhoid does not appear to be actively bleeding.   Musculoskeletal: Normal range of motion.  Neurological: She is alert  and oriented to person, place, and time.  Skin: Skin is warm and dry. No rash noted.  Psychiatric: She has a normal mood and affect. Her speech is rapid and/or pressured and tangential. She is hyperactive. Thought content is delusional ("[place] at [place]").  Nursing note and vitals reviewed.   ED Course  Procedures  DIAGNOSTIC STUDIES: Oxygen Saturation is 94% on RA, adequate by my interpretation.    COORDINATION OF CARE: 4:13 PM Discussed treatment plan with pt at bedside and pt agreed to plan.  Labs Review Labs Reviewed  CBC WITH DIFFERENTIAL/PLATELET - Abnormal; Notable for the following:    Hemoglobin 10.7 (*)    MCH 23.9 (*)    MCHC 29.6 (*)    RDW 20.0 (*)    All other components within normal limits  COMPREHENSIVE METABOLIC PANEL - Abnormal; Notable for  the following:    Potassium 3.3 (*)    CO2 33 (*)    Calcium 8.6 (*)    Albumin 3.4 (*)    AST 13 (*)    All other components within normal limits  ACETAMINOPHEN LEVEL - Abnormal; Notable for the following:    Acetaminophen (Tylenol), Serum <10 (*)    All other components within normal limits  OCCULT BLOOD X 1 CARD TO LAB, STOOL - Abnormal; Notable for the following:    Fecal Occult Bld POSITIVE (*)    All other components within normal limits  URINALYSIS, ROUTINE W REFLEX MICROSCOPIC (NOT AT [facility])  URINE RAPID DRUG SCREEN, HOSP PERFORMED  ETHANOL  SALICYLATE LEVEL  POC OCCULT BLOOD, ED    Imaging Review No results found. I, Serita Grit, MD, personally reviewed and evaluated these images and lab results as part of my medical decision-making.   EKG Interpretation None      MDM   Final diagnoses:  Mania    Most of useful history obtained from patient's daughter. Patient herself appears acutely manic. Daughter says this is been off and on since she had chest surgery about 5 years ago. However, she has never seen her mother symptoms this bad. She was recently in the hospital and started on Lasix  and prednisone. She may have taken too much prednisone, but this was a week ago. To her daughter's knowledge, she has not had any additional prednisone in that amount of time.  Daughter reports that as a secondary complaint, her mother has had to bright red bloody stools which she thinks is secondary to hemorrhoids. She does not have evidence of active ongoing bleeding. She does have hemorrhoids which could be the culprit.  Her chronic medical problems appear to be well compensated. However, she is acutely manic. TTS has consulted and feels that she needs inpatient psychiatric admission. She'll be transferred to [facility] for psych holding. She has remained pleasant, cooperative, and voluntary.  I personally performed the services described in this documentation, which was scribed in my presence. The recorded information has been reviewed and is accurate.     Serita Grit, MD 07/05/15 [phone]
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
## 2015-07-06 DIAGNOSIS — F309 Manic episode, unspecified: Secondary | ICD-10-CM | POA: Diagnosis not present

## 2015-07-06 DIAGNOSIS — F319 Bipolar disorder, unspecified: Secondary | ICD-10-CM | POA: Diagnosis not present

## 2015-07-06 MED ORDER — LORAZEPAM 1 MG PO TABS
1.0000 mg | ORAL_TABLET | Freq: Three times a day (TID) | ORAL | Status: DC | PRN
  Administered 2015-07-08: 1 mg via ORAL

## 2015-07-06 MED ORDER — ZOLPIDEM TARTRATE 5 MG PO TABS
5.0000 mg | ORAL_TABLET | Freq: Every evening | ORAL | Status: DC | PRN
  Administered 2015-07-08: 5 mg via ORAL
  Filled 2015-07-06: qty 1

## 2015-07-06 MED ORDER — QUETIAPINE FUMARATE 25 MG PO TABS
25.0000 mg | ORAL_TABLET | Freq: Two times a day (BID) | ORAL | Status: DC
  Administered 2015-07-06 – 2015-07-07 (×4): 25 mg via ORAL
  Filled 2015-07-06 (×5): qty 1

## 2015-07-06 MED ORDER — ACETAMINOPHEN 325 MG PO TABS: 650.0000 mg | ORAL_TABLET | ORAL | Status: DC | PRN

## 2015-07-06 MED ORDER — ONDANSETRON HCL 4 MG PO TABS: 4.0000 mg | ORAL_TABLET | Freq: Three times a day (TID) | ORAL | Status: DC | PRN

## 2015-07-06 MED ORDER — QUETIAPINE FUMARATE 100 MG PO TABS
100.0000 mg | ORAL_TABLET | Freq: Every day | ORAL | Status: DC
  Administered 2015-07-06 – 2015-07-07 (×2): 100 mg via ORAL
  Filled 2015-07-06 (×2): qty 1

## 2015-07-06 NOTE — ED Notes (Signed)
Psyche MD at bedside.

## 2015-07-06 NOTE — ED Notes (Signed)
Pt belongings moved to locker # 28, pt still has her wallet and her walker (sts it's actually her daughter's walker) in the room with her.

## 2015-07-06 NOTE — Consult Note (Signed)
[facility] Consult   Reason for Consult:  Hypomanic behavior Referring Physician:  EDP Patient Identification: Tonya Hogan MRN:  [id number] Principal Diagnosis: <principal problem not specified> Diagnosis:   Patient Active Problem List   Diagnosis Date Noted  . COPD exacerbation [J44.1] 06/26/2015  . Acute pulmonary edema [J81.0] 06/24/2015  . Essential hypertension [I10] 06/23/2015  . Chronic anemia [D64.9] 06/23/2015  . 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 Shortness of breath [R06.02] 06/23/2015  . Pulmonary edema [J81.1] 06/22/2015    Total Time spent with patient: 30 minutes  Subjective:   Tonya Hogan is a 64 y.o. female patient admitted with hematochezia  HPI:  This patient is a 64 year old widowed black female who lives with her daughter. She presented to the ER with hematochezia. Her daughter also noted that she has been acting in a disorganized manner. Her daughter reports that the patient is having intermittent hallucinations and having "dreams that always come true" she has been talking rapidly and unable to sleep for several days. She does note that her cousin allowed her to take a prescription medication that made all of her symptoms worse but she doesn't remember the name of the medicine. In reviewing her record she doesn't have any psychiatric history noted all the family claims that she has a history of bipolar disorder. The only psychiatric medication prescribed Zoloft.  The patient had been admitted to the medical unit from July 31 through August 4 for pulmonary edema. She was discharged on prednisone 5 mg daily which possibly could be exacerbating her disorganized thoughts. She also has a history of hypothyroidism and the thyroid panel needs to be rechecked. Currently the patient is pleasant but disorganized talking rapidly but denying any hallucinations suicidal ideation or homicidal ideation. HPI Elements:   Location:   global. Quality:  severe. Severity:  severe. Timing:  last 2 weeks. Duration:  weeks. Context:  COPD exacerbation.  Past Medical History:  Past Medical History  Diagnosis Date  . Depression   . Hypertension   . Thyroid disease   . Thyroid ca   . S/P CABG x 3   . Arthritis   . Obesity   . Bipolar 1 disorder     Past Surgical History  Procedure Laterality Date  . Coronary artery bypass graft    . Tonsillectomy    . Abdominal hysterectomy     Family History:  Family History  Problem Relation Age of Onset  . Hypertension Sister   . Bipolar disorder Other    Social History:  History  Alcohol Use No     History  Drug Use No    Social History   Social History  . Marital Status: Widowed    Spouse Name: N/A  . Number of Children: N/A  . Years of Education: N/A   Social History Main Topics  . Smoking status: Never Smoker   . Smokeless tobacco: Never Used  . Alcohol Use: No  . Drug Use: No  . Sexual Activity: No   Other Topics Concern  . None   Social History Narrative   Additional Social History:    Pain Medications: see med list Prescriptions: see med list Over the Counter: see med list History of alcohol / drug use?: No history of alcohol / drug abuse Longest period of sobriety (when/how long):  (na) Negative Consequences of Use:  (na) Withdrawal Symptoms:  (na)  Allergies:   Allergies  Allergen Reactions  . Chocolate     Sneezing, coughing  . Peanut-Containing Drug Products     Sneezing,coughing    Labs:  Results for orders placed or performed during the hospital encounter of 07/05/15 (from the past 48 hour(s))  CBC with Differential/Platelet     Status: Abnormal   Collection Time: 07/05/15  4:50 PM  Result Value Ref Range   WBC 7.9 4.0 - 10.5 K/uL   RBC 4.48 3.87 - 5.11 MIL/uL   Hemoglobin 10.7 (L) 12.0 - 15.0 g/dL   HCT 36.2 36.0 - 46.0 %   MCV 80.8 78.0 - 100.0 fL   MCH 23.9 (L) 26.0 - 34.0 pg   MCHC 29.6  (L) 30.0 - 36.0 g/dL   RDW 20.0 (H) 11.5 - 15.5 %   Platelets 357 150 - 400 K/uL   Neutrophils Relative % 56 43 - 77 %   Neutro Abs 4.5 1.7 - 7.7 K/uL   Lymphocytes Relative 26 12 - 46 %   Lymphs Abs 2.1 0.7 - 4.0 K/uL   Monocytes Relative 12 3 - 12 %   Monocytes Absolute 1.0 0.1 - 1.0 K/uL   Eosinophils Relative 5 0 - 5 %   Eosinophils Absolute 0.4 0.0 - 0.7 K/uL   Basophils Relative 1 0 - 1 %   Basophils Absolute 0.1 0.0 - 0.1 K/uL  Comprehensive metabolic panel     Status: Abnormal   Collection Time: 07/05/15  4:50 PM  Result Value Ref Range   Sodium 143 135 - 145 mmol/L   Potassium 3.3 (L) 3.5 - 5.1 mmol/L   Chloride 101 101 - 111 mmol/L   CO2 33 (H) 22 - 32 mmol/L   Glucose, Bld 89 65 - 99 mg/dL   BUN 19 6 - 20 mg/dL   Creatinine, Ser 0.89 0.44 - 1.00 mg/dL   Calcium 8.6 (L) 8.9 - 10.3 mg/dL   Total Protein 7.7 6.5 - 8.1 g/dL   Albumin 3.4 (L) 3.5 - 5.0 g/dL   AST 13 (L) 15 - 41 U/L   ALT 17 14 - 54 U/L   Alkaline Phosphatase 66 38 - 126 U/L   Total Bilirubin 0.4 0.3 - 1.2 mg/dL   GFR calc non Af Amer >60 >60 mL/min   GFR calc Af Amer >60 >60 mL/min    Comment: (NOTE) The eGFR has been calculated using the CKD EPI equation. This calculation has not been validated in all clinical situations. eGFR's persistently <60 mL/min signify possible Chronic Kidney Disease.    Anion gap 9 5 - 15  Ethanol     Status: None   Collection Time: 07/05/15  4:50 PM  Result Value Ref Range   Alcohol, Ethyl (B) <5 <5 mg/dL    Comment:        LOWEST DETECTABLE LIMIT FOR SERUM ALCOHOL IS 5 mg/dL FOR MEDICAL PURPOSES ONLY   Salicylate level     Status: None   Collection Time: 07/05/15  4:50 PM  Result Value Ref Range   Salicylate Lvl <8.4 2.8 - 30.0 mg/dL  Acetaminophen level     Status: Abnormal   Collection Time: 07/05/15  4:50 PM  Result Value Ref Range   Acetaminophen (Tylenol), Serum <10 (L) 10 - 30 ug/mL    Comment:        THERAPEUTIC CONCENTRATIONS VARY SIGNIFICANTLY. A  RANGE OF 10-30 ug/mL MAY BE AN EFFECTIVE CONCENTRATION FOR MANY PATIENTS. HOWEVER, SOME ARE BEST TREATED AT CONCENTRATIONS  OUTSIDE THIS RANGE. ACETAMINOPHEN CONCENTRATIONS >150 ug/mL AT 4 HOURS AFTER INGESTION AND >50 ug/mL AT 12 HOURS AFTER INGESTION ARE OFTEN ASSOCIATED WITH TOXIC REACTIONS.   Occult blood card to lab, stool     Status: Abnormal   Collection Time: 07/05/15  4:50 PM  Result Value Ref Range   Fecal Occult Bld POSITIVE (A) NEGATIVE  Urinalysis, Routine w reflex microscopic (not at [facility])     Status: None   Collection Time: 07/05/15  9:43 PM  Result Value Ref Range   Color, Urine YELLOW YELLOW   APPearance CLEAR CLEAR   Specific Gravity, Urine 1.022 1.005 - 1.030   pH 6.0 5.0 - 8.0   Glucose, UA NEGATIVE NEGATIVE mg/dL   Hgb urine dipstick NEGATIVE NEGATIVE   Bilirubin Urine NEGATIVE NEGATIVE   Ketones, ur NEGATIVE NEGATIVE mg/dL   Protein, ur NEGATIVE NEGATIVE mg/dL   Urobilinogen, UA 1.0 0.0 - 1.0 mg/dL   Nitrite NEGATIVE NEGATIVE   Leukocytes, UA NEGATIVE NEGATIVE    Comment: MICROSCOPIC NOT DONE ON URINES WITH NEGATIVE PROTEIN, BLOOD, LEUKOCYTES, NITRITE, OR GLUCOSE <1000 mg/dL.  Urine rapid drug screen (hosp performed)     Status: None   Collection Time: 07/05/15  9:43 PM  Result Value Ref Range   Opiates NONE DETECTED NONE DETECTED   Cocaine NONE DETECTED NONE DETECTED   Benzodiazepines NONE DETECTED NONE DETECTED   Amphetamines NONE DETECTED NONE DETECTED   Tetrahydrocannabinol NONE DETECTED NONE DETECTED   Barbiturates NONE DETECTED NONE DETECTED    Comment:        DRUG SCREEN FOR MEDICAL PURPOSES ONLY.  IF CONFIRMATION IS NEEDED FOR ANY PURPOSE, NOTIFY LAB WITHIN 5 DAYS.        LOWEST DETECTABLE LIMITS FOR URINE DRUG SCREEN Drug Class       Cutoff (ng/mL) Amphetamine      1000 Barbiturate      200 Benzodiazepine   675 Tricyclics       916 Opiates          300 Cocaine          300 THC              50     Vitals: Blood pressure 154/86,  pulse 79, temperature 97.5 F (36.4 C), temperature source Oral, resp. rate 18, height 5" (0.127 m), weight 109.317 kg (241 lb), SpO2 100 %.  Risk to Self: Suicidal Ideation: No Suicidal Intent: No Is patient at risk for suicide?: No Suicidal Plan?: No Access to Means: No What has been your use of drugs/alcohol within the last 12 months?: na-pt denies How many times?: 0 Other Self Harm Risks: na-pt denies Triggers for Past Attempts: None known Intentional Self Injurious Behavior: None Risk to Others: Homicidal Ideation: No Thoughts of Harm to Others: No Current Homicidal Intent: No Current Homicidal Plan: No Access to Homicidal Means: No Identified Victim: na-pt denies History of harm to others?: No Assessment of Violence: None Noted Violent Behavior Description: pt pleasant and cooperative Does patient have access to weapons?: No Criminal Charges Pending?: No Does patient have a court date: No Prior Inpatient Therapy: Prior Inpatient Therapy: No Prior Therapy Dates: na Prior Therapy Facilty/Provider(s): na Reason for Treatment: na Prior Outpatient Therapy: Prior Outpatient Therapy: No Prior Therapy Dates: na Prior Therapy Facilty/Provider(s): na Reason for Treatment: na Does patient have an ACCT team?: No Does patient have Intensive In-House Services?  : No Does patient have [facility] services? : No Does patient have P4CC services?: No  Current Facility-Administered Medications  Medication Dose Route Frequency Provider Last Rate Last Dose  . acetaminophen (TYLENOL) tablet 650 mg  650 mg Oral Q4H PRN Serita Grit, MD      . acetaminophen (TYLENOL) tablet 650 mg  650 mg Oral Q4H PRN Ankit Nanavati, MD      . albuterol (PROVENTIL HFA;VENTOLIN HFA) 108 (90 BASE) MCG/ACT inhaler 2 puff  2 puff Inhalation Q6H PRN Serita Grit, MD      . furosemide (LASIX) tablet 40 mg  40 mg Oral Daily Serita Grit, MD   40 mg at 07/06/15 0917  . levothyroxine (SYNTHROID, LEVOTHROID) tablet 125  mcg  125 mcg Oral QAC breakfast Serita Grit, MD   125 mcg at 07/06/15 [phone]  . lisinopril (PRINIVIL,ZESTRIL) tablet 20 mg  20 mg Oral Daily Serita Grit, MD   20 mg at 07/06/15 0916  . LORazepam (ATIVAN) tablet 1 mg  1 mg Oral Q8H PRN Serita Grit, MD      . LORazepam (ATIVAN) tablet 1 mg  1 mg Oral Q8H PRN Varney Biles, MD      . ondansetron (ZOFRAN) tablet 4 mg  4 mg Oral Q8H PRN Ankit Nanavati, MD      . sertraline (ZOLOFT) tablet 25 mg  25 mg Oral Daily Serita Grit, MD   25 mg at 07/06/15 2440  . zolpidem (AMBIEN) tablet 5 mg  5 mg Oral QHS PRN Varney Biles, MD       Current Outpatient Prescriptions  Medication Sig Dispense Refill  . albuterol (PROVENTIL HFA;VENTOLIN HFA) 108 (90 BASE) MCG/ACT inhaler Inhale 2 puffs into the lungs every 6 (six) hours as needed for wheezing or shortness of breath. 1 Inhaler 0  . ferrous sulfate 325 (65 FE) MG tablet Take 325 mg by mouth daily with breakfast.    . furosemide (LASIX) 40 MG tablet Take 1 tablet (40 mg total) by mouth daily. 30 tablet 0  . gabapentin (NEURONTIN) 100 MG capsule Take 200 mg by mouth at bedtime.    . hydrOXYzine (ATARAX/VISTARIL) 25 MG tablet Take 25 mg by mouth 3 (three) times daily as needed for anxiety or itching.     . levothyroxine (SYNTHROID, LEVOTHROID) 125 MCG tablet Take 125 mcg by mouth daily before breakfast.    . lisinopril (PRINIVIL,ZESTRIL) 20 MG tablet Take 20 mg by mouth daily.    . miconazole (MICONAZOLE 7) 2 % vaginal cream Place 1 Applicatorful vaginally at bedtime.    . naproxen sodium (ANAPROX) 220 MG tablet Take 440 mg by mouth 2 (two) times daily as needed (pain).    Marland Kitchen oxyCODONE (OXY IR/ROXICODONE) 5 MG immediate release tablet Take 1 tablet (5 mg total) by mouth every 4 (four) hours as needed for severe pain. 10 tablet 0  . predniSONE (DELTASONE) 5 MG tablet Take 1 tablet (5 mg total) by mouth daily with breakfast.    . sertraline (ZOLOFT) 25 MG tablet Take 25 mg by mouth daily.    . citalopram (CELEXA)  10 MG tablet Take 10 mg by mouth daily.      Musculoskeletal: Strength & Muscle Tone: within normal limits Gait & Station: normal Patient leans: N/A  Psychiatric Specialty Exam: Physical Exam  ROS  Blood pressure 154/86, pulse 79, temperature 97.5 F (36.4 C), temperature source Oral, resp. rate 18, height 5" (0.127 m), weight 109.317 kg (241 lb), SpO2 100 %.Body mass index is 6,777.67 kg/(m^2).  General Appearance: Casual  Eye Contact::  Good  Speech:  Garbled and Pressured  Volume:  Normal  Mood:  Anxious  Affect:  Labile  Thought Process:  Circumstantial and Disorganized  Orientation:  Full (Time, Place, and Person)  Thought Content:  Delusions and Rumination  Suicidal Thoughts:  No  Homicidal Thoughts:  No  Memory:  Immediate;   Fair Recent;   Fair Remote;   Fair  Judgement:  Poor  Insight:  Lacking  Psychomotor Activity:  Decreased  Concentration:  Fair  Recall:  [employer] of Knowledge:Fair  Language: Good  Akathisia:  No  Handed:  Right  AIMS (if indicated):     Assets:  Communication Skills Desire for Improvement Resilience Social Support  ADL's:  Intact  Cognition: WNL  Sleep:      Medical Decision Making: New problem, with additional work up planned, Review or order clinical lab tests (1), Review and summation of old records (2), Review of Medication Regimen & Side Effects (2) and Review of New Medication or Change in Dosage (2)  Treatment Plan Summary: Daily contact with patient to assess and evaluate symptoms and progress in treatment and Medication management  Plan:  No evidence of imminent risk to self or others at present.   Patient does not meet criteria for psychiatric inpatient admission. Disposition: Patient discussed with ER M.D. we've agreed and should she can safely discontinue prednisone 5 mg daily in case this is part of the exacerbation of the manic symptoms. We will also start low-dose Seroquel and discontinue Zoloft. She's currently on  oxygen and not medically stable for psychiatry. Thyroid panel also be rechecked  Mccabe Gloria, [facility] 07/06/2015 11:05 AM

## 2015-07-06 NOTE — ED Notes (Signed)
Pt showered and placed in burgundy scrubs pt ambulated with walker

## 2015-07-06 NOTE — Progress Notes (Signed)
Disposition CSW completed referrals for patient to the following Geri-Psych facilities:  [facility]  CSW will continue to assist with placement needs.  [place] Disposition CSW [phone]

## 2015-07-06 NOTE — ED Notes (Signed)
Awake. Verbally responsive. A/O x4. Resp even and unlabored. No audible adventitious breath sounds noted. ABC's intact. No rectal bleeding noted.

## 2015-07-06 NOTE — ED Notes (Addendum)
Received call from [facility], they stated if pt is to be admitted to their facility (of which they are still unsure), she would have to be IVCd.

## 2015-07-06 NOTE — BHH Counselor (Signed)
Received another TTS consult at 04:59. However, pt has already been assessed by TTS counselor last night (08/13) around 18:30. Pt was transferred from [facility] to [facility] for medical clearance following TTS assessment.   Dr. Parke Poisson recommends inpatient treatment. No available beds at [facility] and [facility] does not take patients on O2, so TTS will seek placement.   Ramond Dial, MS, [facility] Triage Specialist

## 2015-07-06 NOTE — ED Notes (Signed)
Family at bedside visiting

## 2015-07-06 NOTE — ED Notes (Addendum)
Pt moved to rm 12, however, pt's belongings (one bag with pt's clothes) is at locker #29. Pt has her wallet with her at bedside as well as her walker.

## 2015-07-06 NOTE — ED Notes (Signed)
Awake. Watching TV. Verbally responsive. Resp even and unlabored. ABC's intact. No behavior problems noted. Interacts with staff. NAD noted.

## 2015-07-06 NOTE — ED Notes (Addendum)
Awake. Verbally responsive. Watching TV. Resp even and unlabored. ABC's intact. No behavior problems noted. NAD noted.

## 2015-07-07 DIAGNOSIS — F319 Bipolar disorder, unspecified: Secondary | ICD-10-CM | POA: Diagnosis not present

## 2015-07-07 NOTE — ED Notes (Signed)
Pt oriented to room and unit.  She is speaking very rapidly with flight of ideas.  She denies SI, HI, and AVH.  She is obsessing over lottery numbers.  15 minute checks are in place.  Video monitoring is ongoing.
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
## 2015-07-07 NOTE — Progress Notes (Signed)
Pt referred to:  [place], [place] [facility]  [employer] [phone]

## 2015-07-08 DIAGNOSIS — F319 Bipolar disorder, unspecified: Secondary | ICD-10-CM | POA: Diagnosis not present

## 2015-07-08 DIAGNOSIS — F3110 Bipolar disorder, current episode manic without psychotic features, unspecified: Secondary | ICD-10-CM | POA: Diagnosis present

## 2015-07-08 MED ORDER — POTASSIUM CHLORIDE 20 MEQ PO PACK: 20.0000 meq | PACK | Freq: Once | ORAL | Status: DC

## 2015-07-08 MED ORDER — QUETIAPINE FUMARATE 100 MG PO TABS: 200.0000 mg | ORAL_TABLET | Freq: Every day | ORAL | Status: DC

## 2015-07-08 MED ORDER — QUETIAPINE FUMARATE 50 MG PO TABS: 50.0000 mg | ORAL_TABLET | Freq: Every morning | ORAL | Status: DC

## 2015-07-08 MED ORDER — TRAZODONE HCL 100 MG PO TABS: 100.0000 mg | ORAL_TABLET | Freq: Every day | ORAL | Status: DC

## 2015-07-08 MED ORDER — POTASSIUM CHLORIDE CRYS ER 20 MEQ PO TBCR
20.0000 meq | EXTENDED_RELEASE_TABLET | Freq: Once | ORAL | Status: AC
  Administered 2015-07-08: 20 meq via ORAL
  Filled 2015-07-08: qty 1

## 2015-07-08 NOTE — BH Assessment (Addendum)
[place] Assessment Progress Note  The following facilities have been contacted to seek placement for this pt, with results as noted:  Beds available, information sent, decision pending:  [facility]  No beds available, but accepting referrals for future consideration; information sent:  [place]   At capacity:  [place], [place] Triage Specialist [phone]

## 2015-07-08 NOTE — Progress Notes (Addendum)
Patient accepted at [facility] in [place], to [facility], accepting Dr. Launa Grill, room 153-1, please call report when transportation is ready [phone]. Pt. to arrive either before 23:00 tonight or tomorrow, on 8/17, after 6am. RN informed.  Verlon Setting, [place] Disposition staff 07/08/2015 6:33 PM

## 2015-07-08 NOTE — Progress Notes (Signed)
Patient under review at [facility].  Pamala Hurry at [facility] requested contact information to speak with patient's nurse. [facility] ED phone number provided.  Verlon Setting, [place] Disposition staff 07/08/2015 6:14 PM

## 2015-07-08 NOTE — ED Notes (Signed)
Three attempts to call report to [facility].  Was told they would return call for report.  Pt left ambulatory with [place] driver in no distress.  All belongings were returned to pt.

## 2015-07-08 NOTE — ED Notes (Signed)
Pt is calm today.  Alert and oriented.  Resting well.  She denies SI, HI, AVH.  15 minute checks are in place.  Pt. Remains safe on unit.

## 2015-09-06 ENCOUNTER — Encounter (HOSPITAL_BASED_OUTPATIENT_CLINIC_OR_DEPARTMENT_OTHER): Payer: Self-pay | Admitting: *Deleted

## 2015-09-06 ENCOUNTER — Emergency Department (HOSPITAL_BASED_OUTPATIENT_CLINIC_OR_DEPARTMENT_OTHER)
Admission: EM | Admit: 2015-09-06 | Discharge: 2015-09-06 | Disposition: A | Discharge status: Other Acute Inpatient Hospital | Payer: Medicare Other | Attending: Emergency Medicine | Admitting: Emergency Medicine

## 2015-09-06 ENCOUNTER — Emergency Department (HOSPITAL_BASED_OUTPATIENT_CLINIC_OR_DEPARTMENT_OTHER): Payer: Medicare Other

## 2015-09-06 DIAGNOSIS — K5733 Diverticulitis of large intestine without perforation or abscess with bleeding: Secondary | ICD-10-CM | POA: Diagnosis not present

## 2015-09-06 DIAGNOSIS — E079 Disorder of thyroid, unspecified: Secondary | ICD-10-CM | POA: Diagnosis not present

## 2015-09-06 DIAGNOSIS — Z79899 Other long term (current) drug therapy: Secondary | ICD-10-CM | POA: Diagnosis not present

## 2015-09-06 DIAGNOSIS — R0602 Shortness of breath: Secondary | ICD-10-CM | POA: Diagnosis not present

## 2015-09-06 DIAGNOSIS — I10 Essential (primary) hypertension: Secondary | ICD-10-CM | POA: Insufficient documentation

## 2015-09-06 DIAGNOSIS — F319 Bipolar disorder, unspecified: Secondary | ICD-10-CM | POA: Insufficient documentation

## 2015-09-06 DIAGNOSIS — Z7952 Long term (current) use of systemic steroids: Secondary | ICD-10-CM | POA: Insufficient documentation

## 2015-09-06 DIAGNOSIS — Z85850 Personal history of malignant neoplasm of thyroid: Secondary | ICD-10-CM | POA: Insufficient documentation

## 2015-09-06 DIAGNOSIS — B372 Candidiasis of skin and nail: Secondary | ICD-10-CM | POA: Insufficient documentation

## 2015-09-06 DIAGNOSIS — R1030 Lower abdominal pain, unspecified: Secondary | ICD-10-CM | POA: Diagnosis present

## 2015-09-06 DIAGNOSIS — Z951 Presence of aortocoronary bypass graft: Secondary | ICD-10-CM | POA: Diagnosis not present

## 2015-09-06 DIAGNOSIS — M1990 Unspecified osteoarthritis, unspecified site: Secondary | ICD-10-CM | POA: Insufficient documentation

## 2015-09-06 LAB — BRAIN NATRIURETIC PEPTIDE: B NATRIURETIC PEPTIDE 5: 342.5 pg/mL — AB (ref 0.0–100.0)

## 2015-09-06 LAB — URINALYSIS, ROUTINE W REFLEX MICROSCOPIC
Bilirubin Urine: NEGATIVE
Glucose, UA: NEGATIVE mg/dL
Hgb urine dipstick: NEGATIVE
Ketones, ur: NEGATIVE mg/dL
Leukocytes, UA: NEGATIVE
NITRITE: NEGATIVE
PH: 6.5 (ref 5.0–8.0)
Protein, ur: NEGATIVE mg/dL
SPECIFIC GRAVITY, URINE: 1.02 (ref 1.005–1.030)
UROBILINOGEN UA: 1 mg/dL (ref 0.0–1.0)

## 2015-09-06 LAB — CBC WITH DIFFERENTIAL/PLATELET
BASOS ABS: 0 10*3/uL (ref 0.0–0.1)
BASOS PCT: 1 %
Eosinophils Absolute: 0.4 10*3/uL (ref 0.0–0.7)
Eosinophils Relative: 6 %
HEMATOCRIT: 32.6 % — AB (ref 36.0–46.0)
HEMOGLOBIN: 9.5 g/dL — AB (ref 12.0–15.0)
LYMPHS PCT: 20 %
Lymphs Abs: 1.2 10*3/uL (ref 0.7–4.0)
MCH: 24.3 pg — ABNORMAL LOW (ref 26.0–34.0)
MCHC: 29.1 g/dL — AB (ref 30.0–36.0)
MCV: 83.4 fL (ref 78.0–100.0)
MONOS PCT: 12 %
Monocytes Absolute: 0.7 10*3/uL (ref 0.1–1.0)
NEUTROS ABS: 3.8 10*3/uL (ref 1.7–7.7)
NEUTROS PCT: 61 %
Platelets: 301 10*3/uL (ref 150–400)
RBC: 3.91 MIL/uL (ref 3.87–5.11)
RDW: 18.4 % — ABNORMAL HIGH (ref 11.5–15.5)
WBC: 6 10*3/uL (ref 4.0–10.5)

## 2015-09-06 LAB — COMPREHENSIVE METABOLIC PANEL
ALK PHOS: 59 U/L (ref 38–126)
ALT: 9 U/L — AB (ref 14–54)
AST: 9 U/L — AB (ref 15–41)
Albumin: 3.1 g/dL — ABNORMAL LOW (ref 3.5–5.0)
Anion gap: 4 — ABNORMAL LOW (ref 5–15)
BILIRUBIN TOTAL: 0.4 mg/dL (ref 0.3–1.2)
BUN: 17 mg/dL (ref 6–20)
CO2: 33 mmol/L — ABNORMAL HIGH (ref 22–32)
CREATININE: 0.76 mg/dL (ref 0.44–1.00)
Calcium: 8.6 mg/dL — ABNORMAL LOW (ref 8.9–10.3)
Chloride: 105 mmol/L (ref 101–111)
Glucose, Bld: 104 mg/dL — ABNORMAL HIGH (ref 65–99)
Potassium: 3.9 mmol/L (ref 3.5–5.1)
Sodium: 142 mmol/L (ref 135–145)
Total Protein: 7.6 g/dL (ref 6.5–8.1)

## 2015-09-06 LAB — TROPONIN I: Troponin I: <0.03 ng/mL (ref <0.031)

## 2015-09-06 LAB — I-STAT CG4 LACTIC ACID, ED
Lactic Acid, Venous: 0.99 mmol/L (ref 0.5–2.0)
Lactic Acid, Venous: 0.73 mmol/L (ref 0.5–2.0)

## 2015-09-06 LAB — LIPASE, BLOOD: LIPASE: 32 U/L (ref 22–51)

## 2015-09-06 LAB — OCCULT BLOOD X 1 CARD TO LAB, STOOL: FECAL OCCULT BLD: POSITIVE — AB

## 2015-09-06 MED ORDER — SODIUM CHLORIDE 0.9 % IV SOLN: 80.0000 mg | Freq: Once | INTRAVENOUS | Status: DC

## 2015-09-06 MED ORDER — MORPHINE SULFATE (PF) 4 MG/ML IV SOLN
4.0000 mg | Freq: Once | INTRAVENOUS | Status: AC
  Administered 2015-09-06: 4 mg via INTRAVENOUS
  Filled 2015-09-06: qty 1

## 2015-09-06 MED ORDER — ALBUTEROL SULFATE (2.5 MG/3ML) 0.083% IN NEBU
5.0000 mg | INHALATION_SOLUTION | RESPIRATORY_TRACT | Status: AC
  Administered 2015-09-06: 5 mg via RESPIRATORY_TRACT
  Filled 2015-09-06: qty 6

## 2015-09-06 MED ORDER — ONDANSETRON HCL 4 MG/2ML IJ SOLN
4.0000 mg | Freq: Once | INTRAMUSCULAR | Status: AC
  Administered 2015-09-06: 4 mg via INTRAVENOUS
  Filled 2015-09-06: qty 2

## 2015-09-06 MED ORDER — PANTOPRAZOLE SODIUM 40 MG IV SOLR
INTRAVENOUS | Status: AC
  Administered 2015-09-06: 80 mg
  Filled 2015-09-06: qty 40

## 2015-09-06 MED ORDER — SODIUM CHLORIDE 0.9 % IV SOLN: INTRAVENOUS | Status: DC

## 2015-09-06 MED ORDER — FUROSEMIDE 10 MG/ML IJ SOLN
40.0000 mg | Freq: Once | INTRAMUSCULAR | Status: AC
  Administered 2015-09-06: 40 mg via INTRAVENOUS
  Filled 2015-09-06: qty 4

## 2015-09-06 MED ORDER — IOHEXOL 300 MG/ML  SOLN
50.0000 mL | Freq: Once | INTRAMUSCULAR | Status: AC | PRN
  Administered 2015-09-06: 50 mL via ORAL

## 2015-09-06 MED ORDER — IOHEXOL 300 MG/ML  SOLN
100.0000 mL | Freq: Once | INTRAMUSCULAR | Status: AC | PRN
  Administered 2015-09-06: 100 mL via INTRAVENOUS

## 2015-09-06 MED ORDER — AMPICILLIN-SULBACTAM SODIUM 3 (2-1) G IJ SOLR
3.0000 g | Freq: Once | INTRAMUSCULAR | Status: AC
  Administered 2015-09-06: 3 g via INTRAVENOUS
  Filled 2015-09-06: qty 3

## 2015-09-06 MED ORDER — PANTOPRAZOLE SODIUM 40 MG IV SOLR
INTRAVENOUS | Status: AC
  Filled 2015-09-06: qty 40

## 2015-09-06 MED ORDER — SODIUM CHLORIDE 0.9 % IV BOLUS (SEPSIS)
500.0000 mL | Freq: Once | INTRAVENOUS | Status: AC
  Administered 2015-09-06: 500 mL via INTRAVENOUS

## 2015-09-06 NOTE — ED Notes (Signed)
Pt c/o lower abdominal pain x 1 month- reports BRB with bowel movements

## 2015-09-06 NOTE — ED Notes (Signed)
Pt placed on heart monitor and continuous pulse ox.

## 2015-09-06 NOTE — ED Notes (Signed)
Pt states to P[place], PA that she does not wear O2 all the time at home and felt that she didn't need to wear it if she didn't feel SOB. Pisciotta reinforced education that while in our care, she needs to be mindful of keeping her O2 on, especially when ambulating. Pt verbalized understanding.

## 2015-09-06 NOTE — ED Notes (Signed)
Iona Beard EMT was taking vitals for pt and noted O2 sat of 55%, noted that pt's oxygen was off after pt had gone to bathroom. Upon entering room, pt was alert, oriented, interactive, breathing easy, equal and unlabored, speaking in complete sentences with no difficulty, denied SOB or any complaint at all. Pt was placed on 5 L O2 and her sat rose to 95%. Pisciotta, PA came to room to assess pt, provided verbal order for 40 mg Lasix IV and albuterol treatment.

## 2015-09-06 NOTE — ED Provider Notes (Signed)
CSN: [id number]     Arrival date & time 09/06/15  1346 History   First MD Initiated Contact with Patient 09/06/15 1403     Chief Complaint  Patient presents with  . Abdominal Pain     (Consider location/radiation/quality/duration/timing/severity/associated sxs/prior Treatment) HPI   Blood pressure 179/84, pulse 67, temperature 99 F (37.2 C), temperature source Oral, resp. rate 24, height 5' (1.524 m), weight 260 lb (117.935 kg), SpO2 92 %.  Tonya Hogan is a 64 y.o. female complaining of worsening lower abdominal pain onset this morning with associated bright red blood per rectum. Patient has had normally formed non-melanotic stool this morning. She has had bilateral lower abdominal pain which radiates down the legs for 1 month. Patient has history of GI bleed which required admission. She denies alcohol use, anticoagulation, she takes NSAIDs and only takes approximately 2 500 mg naproxen per day. She denies epigastric abdominal pain, nausea, vomiting. She reports worsening shortness of breath with paroxysmal nocturnal dyspnea, states that her peripheral edema had been worse but has improved some today. She also has wheezing. Patient denies chest pains, palpitations, syncope, lightheaded sensation when going from sitting to standing. Patient is on 2 L of oxygen at home, unclear what the diagnosis is, states she does not think she has COPD. Recently admitted to [facility].   PCP: [facility] Pulmonary critical care: Has appointment at the end of this month at [facility]   Past Medical History  Diagnosis Date  . Depression   . Hypertension   . Thyroid disease   . Thyroid ca ([place])   . S/P CABG x 3   . Arthritis   . Obesity   . Bipolar 1 disorder [facility])    Past Surgical History  Procedure Laterality Date  . Coronary artery bypass graft    . Tonsillectomy    . Abdominal hysterectomy     Family History  Problem Relation Age of Onset  . Hypertension Sister   .  Bipolar disorder Other    Social History  Substance Use Topics  . Smoking status: Never Smoker   . Smokeless tobacco: Never Used  . Alcohol Use: No   OB History    No data available     Review of Systems  10 systems reviewed and found to be negative, except as noted in the HPI.   Allergies  Chocolate and Peanut-containing drug products  Home Medications   Prior to Admission medications   Medication Sig Start Date End Date Taking? Authorizing Provider  albuterol (PROVENTIL HFA;VENTOLIN HFA) 108 (90 BASE) MCG/ACT inhaler Inhale 2 puffs into the lungs every 6 (six) hours as needed for wheezing or shortness of breath. 06/26/15  Yes Donne Hazel, MD  Divalproex Sodium (DEPAKOTE PO) Take by mouth 2 (two) times daily.   Yes Historical Provider, MD  ferrous sulfate 325 (65 FE) MG tablet Take 325 mg by mouth daily with breakfast.   Yes Historical Provider, MD  furosemide (LASIX) 40 MG tablet Take 1 tablet (40 mg total) by mouth daily. 06/26/15  Yes Donne Hazel, MD  gabapentin (NEURONTIN) 100 MG capsule Take 200 mg by mouth at bedtime.   Yes Historical Provider, MD  levothyroxine (SYNTHROID, LEVOTHROID) 125 MCG tablet Take 125 mcg by mouth daily before breakfast.   Yes Historical Provider, MD  lisinopril (PRINIVIL,ZESTRIL) 20 MG tablet Take 20 mg by mouth daily.   Yes Historical Provider, MD  miconazole (MICONAZOLE 7) 2 % vaginal cream Place 1 Applicatorful  vaginally at bedtime.   Yes Historical Provider, MD  naproxen sodium (ANAPROX) 220 MG tablet Take 440 mg by mouth 2 (two) times daily as needed (pain).   Yes Historical Provider, MD  RISPERIDONE PO Take by mouth at bedtime.   Yes Historical Provider, MD  sertraline (ZOLOFT) 25 MG tablet Take 25 mg by mouth daily.   Yes Historical Provider, MD  zolpidem (AMBIEN) 5 MG tablet Take 5 mg by mouth at bedtime as needed for sleep.   Yes Historical Provider, MD  citalopram (CELEXA) 10 MG tablet Take 10 mg by mouth daily.    Historical Provider,  MD  hydrOXYzine (ATARAX/VISTARIL) 25 MG tablet Take 25 mg by mouth 3 (three) times daily as needed for anxiety or itching.     Historical Provider, MD  oxyCODONE (OXY IR/ROXICODONE) 5 MG immediate release tablet Take 1 tablet (5 mg total) by mouth every 4 (four) hours as needed for severe pain. 06/26/15   Donne Hazel, MD  predniSONE (DELTASONE) 5 MG tablet Take 1 tablet (5 mg total) by mouth daily with breakfast. 06/26/15   Donne Hazel, MD   BP 111/48 mmHg  Pulse 62  Temp(Src) 99 F (37.2 C) (Oral)  Resp 20  Ht 5' (1.524 m)  Wt 260 lb (117.935 kg)  BMI 50.78 kg/m2  SpO2 92% Physical Exam  Constitutional: She is oriented to person, place, and time. She appears well-developed and well-nourished. No distress.  Morbidly obese  HENT:  Head: Normocephalic.  Eyes: Conjunctivae and EOM are normal. Pupils are equal, round, and reactive to light.  Cardiovascular: Normal rate, regular rhythm and intact distal pulses.   Pulmonary/Chest: Effort normal and breath sounds normal. No stridor.  Poor air movement, trace expiratory wheezing. Patient is on 2 L via nasal cannula  Abdominal: Soft. Bowel sounds are normal. She exhibits no distension and no mass. There is no tenderness. There is no rebound and no guarding.  Genitourinary:  Digital rectal exam is chaperoned by nurse: No rashes or lesions, normal rectal tone, normal stool color  Musculoskeletal: Normal range of motion.  Neurological: She is alert and oriented to person, place, and time.  Skin: Rash noted.  Candidal  dermatitis on for so pannus and underneath breasts.  Psychiatric: She has a normal mood and affect.  Nursing note and vitals reviewed.   ED Course  Procedures (including critical care time) Labs Review Labs Reviewed  COMPREHENSIVE METABOLIC PANEL - Abnormal; Notable for the following:    CO2 33 (*)    Glucose, Bld 104 (*)    Calcium 8.6 (*)    Albumin 3.1 (*)    AST 9 (*)    ALT 9 (*)    Anion gap 4 (*)    All other  components within normal limits  CBC WITH DIFFERENTIAL/PLATELET - Abnormal; Notable for the following:    Hemoglobin 9.5 (*)    HCT 32.6 (*)    MCH 24.3 (*)    MCHC 29.1 (*)    RDW 18.4 (*)    All other components within normal limits  OCCULT BLOOD X 1 CARD TO LAB, STOOL - Abnormal; Notable for the following:    Fecal Occult Bld POSITIVE (*)    All other components within normal limits  BRAIN NATRIURETIC PEPTIDE - Abnormal; Notable for the following:    B Natriuretic Peptide 342.5 (*)    All other components within normal limits  URINE CULTURE  LIPASE, BLOOD  TROPONIN I  URINALYSIS, ROUTINE W REFLEX  MICROSCOPIC (NOT AT [facility])  I-STAT CG4 LACTIC ACID, ED    Imaging Review Dg Chest 2 View  09/06/2015  CLINICAL DATA:  Pt. States right side chest pain and SOB x2-3 mo. Severity increased today. Hx thyroid cancer 5 yrs. Ago. Hx open heart surg. 5 yrs. Ago. Hx HTN. EXAM: CHEST  2 VIEW COMPARISON:  06/22/2015 FINDINGS: There are stable changes from previous cardiac surgery. Cardiac silhouette is mildly enlarged. No mediastinal or hilar masses or evidence of adenopathy. Mild interstitial thickening is noted bilaterally similar to the prior study. Areas of linear opacity are noted in the lungs most evident in the mid to lower left upper lobe, stable, consistent with scarring or chronic atelectasis. No evidence of pneumonia. No pleural effusion or pneumothorax. Bony thorax is demineralized but grossly intact. IMPRESSION: No significant change from prior exam. Mild cardiomegaly and interstitial prominence. This may be chronic or reflect mild degree congestive heart failure. No evidence of pneumonia. Electronically Signed   By: Lajean Manes M.D.   On: 09/06/2015 17:46   Ct Abdomen Pelvis W Contrast  09/06/2015  CLINICAL DATA:  Abdominal pain, bloody stools EXAM: CT ABDOMEN AND PELVIS WITH CONTRAST TECHNIQUE: Multidetector CT imaging of the abdomen and pelvis was performed using the standard protocol  following bolus administration of intravenous contrast. CONTRAST:  38mL OMNIPAQUE IOHEXOL 300 MG/ML SOLN, 126mL OMNIPAQUE IOHEXOL 300 MG/ML SOLN COMPARISON:  None. FINDINGS: Lung bases are unremarkable. There are streak artifacts from patient's large body habitus. Sagittal images of the spine shows degenerative changes lumbar spine. Mild fatty infiltration of the liver. No focal hepatic mass. No calcified gallstones are noted within gallbladder. The pancreas, spleen and adrenal glands are unremarkable. There is a cyst in midpole of the left kidney measures 2.5 cm. No hydronephrosis or hydroureter. Delayed renal images shows bilateral renal symmetrical excretion. Bilateral visualized proximal ureter is unremarkable. No aortic aneurysm. Atherosclerotic calcifications of abdominal aorta and iliac arteries are noted. No small bowel obstruction. No pericecal inflammation. The terminal ileum is unremarkable Normal appendix is partially visualized coronal image 57. Colonic diverticula are noted descending colon and sigmoid colon. In axial image 51 there is subtle mild stranding of pericolonic fat in distal left colon. Mild diverticulitis cannot be excluded. No abscess or perforation is noted. Clinical correlation is necessary. No distal colonic obstruction. Prostate gland and seminal vesicles are unremarkable. Small nonspecific bilateral inguinal lymph nodes are noted. Urinary bladder is unremarkable. IMPRESSION: 1. There are colonic diverticula in descending colon and sigmoid colon. Subtle mild stranding of pericolonic fat in distal left colon. Mild diverticulitis cannot be excluded. Clinical correlation is necessary. No abscess or perforation is noted. 2. Normal appendix.  No pericecal inflammation. 3. Mild fatty infiltration of the liver. 4. No small bowel obstruction. 5. Atherosclerotic calcifications of abdominal aorta and iliac arteries are noted. 6. No hydronephrosis or hydroureter. There is a cyst in midpole of the  left kidney measures 2.5 cm Electronically Signed   By: Lahoma Crocker M.D.   On: 09/06/2015 16:22   I have personally reviewed and evaluated these images and lab results as part of my medical decision-making.   EKG Interpretation   Date/Time:  Saturday September 06 2015 15:10:04 EDT Ventricular Rate:  64 PR Interval:  154 QRS Duration: 84 QT Interval:  464 QTC Calculation: 478 R Axis:   59 Text Interpretation:  Normal sinus rhythm Nonspecific T wave abnormality  Abnormal ECG no acute ischemia. invert T wave V3 , Confirmed by MACKUEN,  COURTNEY ([id number])  on 09/06/2015 3:17:09 PM      MDM   Final diagnoses:  SOB (shortness of breath)  Diverticulitis of large intestine without perforation or abscess with bleeding    Filed Vitals:   09/06/15 1411 09/06/15 1500 09/06/15 1501 09/06/15 1735  BP:  161/68  111/48  Pulse:  66  62  Temp:      TempSrc:      Resp:  15  20  Height:      Weight:      SpO2: 92% 88% 97% 92%    Medications  Ampicillin-Sulbactam (UNASYN) 3 g in sodium chloride 0.9 % 100 mL IVPB (3 g Intravenous New Bag/Given 09/06/15 1710)  0.9 %  sodium chloride infusion (not administered)  sodium chloride 0.9 % bolus 500 mL (0 mLs Intravenous Stopped 09/06/15 1520)  morphine 4 MG/ML injection 4 mg (4 mg Intravenous Given 09/06/15 1458)  ondansetron (ZOFRAN) injection 4 mg (4 mg Intravenous Given 09/06/15 1458)  iohexol (OMNIPAQUE) 300 MG/ML solution 50 mL (50 mLs Oral Contrast Given 09/06/15 1456)  iohexol (OMNIPAQUE) 300 MG/ML solution 100 mL (100 mLs Intravenous Contrast Given 09/06/15 1555)  pantoprazole (PROTONIX) 80 mg in sodium chloride 0.9 % 100 mL IVPB (0 mg Intravenous Duplicate 12/87/86 7672)  pantoprazole (PROTONIX) 40 MG injection (80 mg  Given 09/06/15 1523)  pantoprazole (PROTONIX) 40 MG injection (  Duplicate 09/47/09 6283)    Tonya Hogan is 64 y.o. female presenting with lower abdominal pain and bright red blood per rectum onset today. On my exam patient  has normal stool color however guaiac is positive. CT is consistent with diverticulitis, patient will be started on Unasyn. Hemoglobin is 9.5, this is down 1 point in the last month. Will need admission for hemoglobin monitoring.  Patient is reporting worsening shortness of breath, paroxysmal nocturnal dyspnea. Chart review shows the patient has diastolic CHF. On my exam she doesn't appear overtly fluid overloaded. Her proBNP is elevated at over 300. Chest x-ray without overt pulmonary edema.  Case discussed with hospitalist Dr. Elijah Birk at [place] who accepts admission and transfer   This is a shared visit with the attending physician who personally evaluated the patient and agrees with the care plan.    Monico Blitz, PA-C 09/06/15 1759  8 PM: Patient seen and evaluated the bedside she walked herself to the restroom without oxygen supplementation, when she got back into the room the O2 sat was in the 50s. Patient states that she felt fine. Nurse states that it was a good waveform. Patient is requiring 5 L via nasal cannula to stay in the 90s. Patient will be given 40 mg Lasix IV and a 5 mg albuterol nebulizer treatment. Denies chest pain.  Monico Blitz, PA-C 09/06/15 2004  Malvin Johns, MD 09/06/15 2158

## 2015-09-08 LAB — URINE CULTURE

## 2015-09-09 ENCOUNTER — Telehealth (HOSPITAL_COMMUNITY): Payer: Self-pay

## 2015-09-09 NOTE — Telephone Encounter (Signed)
Post ED Visit - Positive Culture Follow-up  Culture report reviewed by antimicrobial stewardship pharmacist:  []  Heide Guile, Pharm.D., BCPS []  Alycia Rossetti, Pharm.D., BCPS []  [place], Pharm.D., BCPS, AAHIVP []  Legrand Como, Pharm.D., BCPS, AAHIVP []  [place], Pharm.D. [x]  [place], [place].D.  Positive urine culture  no further patient follow-up is required at this time per M. Camprubi-Soms  Tonya Hogan 09/09/2015, 9:54 AM

## 2015-09-09 NOTE — Progress Notes (Signed)
ED Antimicrobial Stewardship Positive Culture Follow Up   Tonya Hogan is an 64 y.o. female who presented to [facility] on 09/06/2015 with a chief complaint of  Chief Complaint  Patient presents with  . Abdominal Pain    Recent Results (from the past 720 hour(s))  Urine culture     Status: None   Collection Time: 09/06/15  2:45 PM  Result Value Ref Range Status   Specimen Description URINE, CLEAN CATCH  Final   Special Requests NONE  Final   Culture   Final    30,000 COLONIES/mL GROUP B STREP(S.AGALACTIAE)ISOLATED TESTING AGAINST S. AGALACTIAE NOT ROUTINELY PERFORMED DUE TO PREDICTABILITY OF AMP/PEN/VAN SUSCEPTIBILITY. Performed at [facility]    Report Status 09/08/2015 FINAL  Final   [x]  Patient discharged originally without antimicrobial agent   ED Provider: Erenest Blank, PA-C  Assessment: 1 yoF presenting to ED with complaints of abdominal pain x1 month with bright red blood per rectum. UA unremarkable. UCx now growing only 30,000 colonies Group B strep.   Plan: No treatment  Darl Pikes, PharmD Clinical Pharmacist- Resident Pager: [phone]  Darl Pikes 09/09/2015, 8:37 AM Infectious Diseases Pharmacist Phone# [phone]

## 2015-10-06 ENCOUNTER — Inpatient Hospital Stay (HOSPITAL_BASED_OUTPATIENT_CLINIC_OR_DEPARTMENT_OTHER)
Admission: EM | Admit: 2015-10-06 | Discharge: 2015-10-09 | DRG: 291 | Disposition: A | Discharge status: Home / Self Care | Payer: Medicare Other | Attending: Internal Medicine | Admitting: Internal Medicine

## 2015-10-06 ENCOUNTER — Emergency Department (HOSPITAL_BASED_OUTPATIENT_CLINIC_OR_DEPARTMENT_OTHER): Payer: Medicare Other

## 2015-10-06 ENCOUNTER — Encounter (HOSPITAL_BASED_OUTPATIENT_CLINIC_OR_DEPARTMENT_OTHER): Payer: Self-pay | Admitting: Emergency Medicine

## 2015-10-06 DIAGNOSIS — I2510 Atherosclerotic heart disease of native coronary artery without angina pectoris: Secondary | ICD-10-CM | POA: Diagnosis present

## 2015-10-06 DIAGNOSIS — R0602 Shortness of breath: Secondary | ICD-10-CM | POA: Diagnosis present

## 2015-10-06 DIAGNOSIS — Z951 Presence of aortocoronary bypass graft: Secondary | ICD-10-CM

## 2015-10-06 DIAGNOSIS — I5023 Acute on chronic systolic (congestive) heart failure: Secondary | ICD-10-CM | POA: Diagnosis not present

## 2015-10-06 DIAGNOSIS — E039 Hypothyroidism, unspecified: Secondary | ICD-10-CM | POA: Diagnosis present

## 2015-10-06 DIAGNOSIS — R29810 Facial weakness: Secondary | ICD-10-CM | POA: Diagnosis present

## 2015-10-06 DIAGNOSIS — F3160 Bipolar disorder, current episode mixed, unspecified: Secondary | ICD-10-CM | POA: Diagnosis present

## 2015-10-06 DIAGNOSIS — J9621 Acute and chronic respiratory failure with hypoxia: Secondary | ICD-10-CM | POA: Diagnosis present

## 2015-10-06 DIAGNOSIS — E876 Hypokalemia: Secondary | ICD-10-CM | POA: Diagnosis present

## 2015-10-06 DIAGNOSIS — Z85850 Personal history of malignant neoplasm of thyroid: Secondary | ICD-10-CM

## 2015-10-06 DIAGNOSIS — I4581 Long QT syndrome: Secondary | ICD-10-CM | POA: Diagnosis present

## 2015-10-06 DIAGNOSIS — Z9981 Dependence on supplemental oxygen: Secondary | ICD-10-CM | POA: Diagnosis not present

## 2015-10-06 DIAGNOSIS — Z9111 Patient's noncompliance with dietary regimen: Secondary | ICD-10-CM | POA: Diagnosis not present

## 2015-10-06 DIAGNOSIS — F3110 Bipolar disorder, current episode manic without psychotic features, unspecified: Secondary | ICD-10-CM | POA: Diagnosis present

## 2015-10-06 DIAGNOSIS — I10 Essential (primary) hypertension: Secondary | ICD-10-CM | POA: Diagnosis not present

## 2015-10-06 DIAGNOSIS — I110 Hypertensive heart disease with heart failure: Secondary | ICD-10-CM | POA: Diagnosis present

## 2015-10-06 DIAGNOSIS — D649 Anemia, unspecified: Secondary | ICD-10-CM | POA: Diagnosis present

## 2015-10-06 DIAGNOSIS — I5033 Acute on chronic diastolic (congestive) heart failure: Principal | ICD-10-CM | POA: Diagnosis present

## 2015-10-06 DIAGNOSIS — Z79899 Other long term (current) drug therapy: Secondary | ICD-10-CM | POA: Diagnosis not present

## 2015-10-06 DIAGNOSIS — J441 Chronic obstructive pulmonary disease with (acute) exacerbation: Secondary | ICD-10-CM | POA: Diagnosis present

## 2015-10-06 DIAGNOSIS — E785 Hyperlipidemia, unspecified: Secondary | ICD-10-CM | POA: Diagnosis present

## 2015-10-06 DIAGNOSIS — I509 Heart failure, unspecified: Secondary | ICD-10-CM | POA: Diagnosis not present

## 2015-10-06 DIAGNOSIS — M1990 Unspecified osteoarthritis, unspecified site: Secondary | ICD-10-CM | POA: Diagnosis present

## 2015-10-06 DIAGNOSIS — I5040 Unspecified combined systolic (congestive) and diastolic (congestive) heart failure: Secondary | ICD-10-CM

## 2015-10-06 DIAGNOSIS — J449 Chronic obstructive pulmonary disease, unspecified: Secondary | ICD-10-CM | POA: Diagnosis present

## 2015-10-06 HISTORY — DX: Atherosclerotic heart disease of native coronary artery without angina pectoris: I25.10

## 2015-10-06 HISTORY — DX: Heart failure, unspecified: I50.9

## 2015-10-06 HISTORY — DX: Hypothyroidism, unspecified: E03.9

## 2015-10-06 LAB — CBC
HCT: 34.2 % — ABNORMAL LOW (ref 36.0–46.0)
Hemoglobin: 9.7 g/dL — ABNORMAL LOW (ref 12.0–15.0)
MCH: 24.5 pg — AB (ref 26.0–34.0)
MCHC: 28.4 g/dL — AB (ref 30.0–36.0)
MCV: 86.4 fL (ref 78.0–100.0)
PLATELETS: 243 10*3/uL (ref 150–400)
RBC: 3.96 MIL/uL (ref 3.87–5.11)
RDW: 19.3 % — ABNORMAL HIGH (ref 11.5–15.5)
WBC: 5.6 10*3/uL (ref 4.0–10.5)

## 2015-10-06 LAB — URINALYSIS, ROUTINE W REFLEX MICROSCOPIC
Bilirubin Urine: NEGATIVE
GLUCOSE, UA: NEGATIVE mg/dL
HGB URINE DIPSTICK: NEGATIVE
KETONES UR: NEGATIVE mg/dL
Nitrite: NEGATIVE
PROTEIN: NEGATIVE mg/dL
Specific Gravity, Urine: 1.011 (ref 1.005–1.030)
Urobilinogen, UA: 1 mg/dL (ref 0.0–1.0)
pH: 6.5 (ref 5.0–8.0)

## 2015-10-06 LAB — COMPREHENSIVE METABOLIC PANEL
ALBUMIN: 3.4 g/dL — AB (ref 3.5–5.0)
ALK PHOS: 63 U/L (ref 38–126)
ALT: 17 U/L (ref 14–54)
AST: 13 U/L — ABNORMAL LOW (ref 15–41)
Anion gap: 7 (ref 5–15)
BUN: 21 mg/dL — ABNORMAL HIGH (ref 6–20)
CALCIUM: 8.8 mg/dL — AB (ref 8.9–10.3)
CO2: 38 mmol/L — AB (ref 22–32)
CREATININE: 0.83 mg/dL (ref 0.44–1.00)
Chloride: 99 mmol/L — ABNORMAL LOW (ref 101–111)
GFR calc Af Amer: >60 mL/min (ref >60)
GFR calc non Af Amer: >60 mL/min (ref >60)
GLUCOSE: 92 mg/dL (ref 65–99)
Potassium: 3.8 mmol/L (ref 3.5–5.1)
SODIUM: 144 mmol/L (ref 135–145)
Total Bilirubin: 0.6 mg/dL (ref 0.3–1.2)
Total Protein: 7.8 g/dL (ref 6.5–8.1)

## 2015-10-06 LAB — URINE MICROSCOPIC-ADD ON

## 2015-10-06 LAB — DIFFERENTIAL
Basophils Absolute: 0 10*3/uL (ref 0.0–0.1)
Basophils Relative: 0 %
EOS ABS: 0.4 10*3/uL (ref 0.0–0.7)
EOS PCT: 7 %
LYMPHS ABS: 1 10*3/uL (ref 0.7–4.0)
LYMPHS PCT: 18 %
MONO ABS: 0.7 10*3/uL (ref 0.1–1.0)
Monocytes Relative: 13 %
NEUTROS PCT: 62 %
Neutro Abs: 3.4 10*3/uL (ref 1.7–7.7)

## 2015-10-06 LAB — PROTIME-INR
INR: 1.13 (ref 0.00–1.49)
PROTHROMBIN TIME: 14.7 s (ref 11.6–15.2)

## 2015-10-06 LAB — APTT: aPTT: 27 seconds (ref 24–37)

## 2015-10-06 LAB — TROPONIN I: Troponin I: <0.03 ng/mL (ref <0.031)

## 2015-10-06 LAB — BRAIN NATRIURETIC PEPTIDE: B Natriuretic Peptide: 532.1 pg/mL — ABNORMAL HIGH (ref 0.0–100.0)

## 2015-10-06 LAB — CBG MONITORING, ED: Glucose-Capillary: 75 mg/dL (ref 65–99)

## 2015-10-06 MED ORDER — FUROSEMIDE 10 MG/ML IJ SOLN
60.0000 mg | Freq: Once | INTRAMUSCULAR | Status: AC
  Administered 2015-10-07: 60 mg via INTRAVENOUS
  Filled 2015-10-06: qty 8

## 2015-10-06 MED ORDER — LEVOTHYROXINE SODIUM 25 MCG PO TABS
125.0000 ug | ORAL_TABLET | Freq: Every day | ORAL | Status: DC
  Administered 2015-10-07 – 2015-10-09 (×3): 125 ug via ORAL
  Filled 2015-10-06 (×4): qty 1

## 2015-10-06 MED ORDER — FERROUS SULFATE 325 (65 FE) MG PO TABS
325.0000 mg | ORAL_TABLET | Freq: Every day | ORAL | Status: DC
  Administered 2015-10-07 – 2015-10-09 (×3): 325 mg via ORAL
  Filled 2015-10-06 (×3): qty 1

## 2015-10-06 MED ORDER — LISINOPRIL 20 MG PO TABS
20.0000 mg | ORAL_TABLET | Freq: Every day | ORAL | Status: DC
  Administered 2015-10-07 – 2015-10-09 (×3): 20 mg via ORAL
  Filled 2015-10-06 (×3): qty 1

## 2015-10-06 MED ORDER — ASPIRIN 325 MG PO TABS
325.0000 mg | ORAL_TABLET | Freq: Every day | ORAL | Status: DC
  Administered 2015-10-07 – 2015-10-09 (×3): 325 mg via ORAL
  Filled 2015-10-06 (×3): qty 1

## 2015-10-06 MED ORDER — STROKE: EARLY STAGES OF RECOVERY BOOK
Freq: Once | Status: AC
  Administered 2015-10-07

## 2015-10-06 MED ORDER — DIVALPROEX SODIUM 250 MG PO DR TAB
250.0000 mg | DELAYED_RELEASE_TABLET | Freq: Two times a day (BID) | ORAL | Status: DC
  Administered 2015-10-07 – 2015-10-09 (×5): 250 mg via ORAL
  Filled 2015-10-06 (×8): qty 1

## 2015-10-06 MED ORDER — ONDANSETRON HCL 4 MG/2ML IJ SOLN: 4.0000 mg | Freq: Four times a day (QID) | INTRAMUSCULAR | Status: DC | PRN

## 2015-10-06 MED ORDER — SERTRALINE HCL 50 MG PO TABS
25.0000 mg | ORAL_TABLET | Freq: Every day | ORAL | Status: DC
  Administered 2015-10-07: 25 mg via ORAL
  Filled 2015-10-06: qty 1

## 2015-10-06 MED ORDER — HEPARIN SODIUM (PORCINE) 5000 UNIT/ML IJ SOLN
5000.0000 [IU] | Freq: Three times a day (TID) | INTRAMUSCULAR | Status: DC
  Administered 2015-10-07 – 2015-10-09 (×9): 5000 [IU] via SUBCUTANEOUS
  Filled 2015-10-06 (×9): qty 1

## 2015-10-06 MED ORDER — GABAPENTIN 100 MG PO CAPS
200.0000 mg | ORAL_CAPSULE | Freq: Every day | ORAL | Status: DC
  Administered 2015-10-07 – 2015-10-08 (×3): 200 mg via ORAL
  Filled 2015-10-06 (×3): qty 2

## 2015-10-06 MED ORDER — HYDROXYZINE HCL 25 MG PO TABS: 25.0000 mg | ORAL_TABLET | Freq: Three times a day (TID) | ORAL | Status: DC | PRN

## 2015-10-06 MED ORDER — ASPIRIN 300 MG RE SUPP: 300.0000 mg | Freq: Every day | RECTAL | Status: DC

## 2015-10-06 MED ORDER — ZOLPIDEM TARTRATE 5 MG PO TABS
5.0000 mg | ORAL_TABLET | Freq: Every evening | ORAL | Status: DC | PRN
Start: 2015-10-06 — End: 2015-10-09

## 2015-10-06 MED ORDER — CLOTRIMAZOLE 1 % VA CREA
1.0000 | TOPICAL_CREAM | Freq: Every day | VAGINAL | Status: DC
  Administered 2015-10-07 – 2015-10-08 (×3): 1 via VAGINAL
  Filled 2015-10-06: qty 45

## 2015-10-06 MED ORDER — SODIUM CHLORIDE 0.9 % IJ SOLN: 3.0000 mL | INTRAMUSCULAR | Status: DC | PRN

## 2015-10-06 MED ORDER — RISPERIDONE 0.5 MG PO TABS
1.5000 mg | ORAL_TABLET | Freq: Every day | ORAL | Status: DC
  Administered 2015-10-07 – 2015-10-08 (×3): 1.5 mg via ORAL
  Filled 2015-10-06 (×3): qty 3

## 2015-10-06 MED ORDER — NAPROXEN 250 MG PO TABS: 500.0000 mg | ORAL_TABLET | Freq: Two times a day (BID) | ORAL | Status: DC | PRN

## 2015-10-06 MED ORDER — SODIUM CHLORIDE 0.9 % IJ SOLN
3.0000 mL | Freq: Two times a day (BID) | INTRAMUSCULAR | Status: DC
  Administered 2015-10-06 – 2015-10-08 (×5): 3 mL via INTRAVENOUS

## 2015-10-06 MED ORDER — DM-GUAIFENESIN ER 30-600 MG PO TB12
1.0000 | ORAL_TABLET | Freq: Two times a day (BID) | ORAL | Status: DC
  Administered 2015-10-07 – 2015-10-09 (×6): 1 via ORAL
  Filled 2015-10-06 (×6): qty 1

## 2015-10-06 MED ORDER — ALBUTEROL SULFATE (2.5 MG/3ML) 0.083% IN NEBU: 2.5000 mg | INHALATION_SOLUTION | RESPIRATORY_TRACT | Status: DC | PRN

## 2015-10-06 MED ORDER — ACETAMINOPHEN 325 MG PO TABS
650.0000 mg | ORAL_TABLET | ORAL | Status: DC | PRN
  Administered 2015-10-07: 650 mg via ORAL
  Filled 2015-10-06: qty 2

## 2015-10-06 MED ORDER — CITALOPRAM HYDROBROMIDE 10 MG PO TABS
10.0000 mg | ORAL_TABLET | Freq: Every day | ORAL | Status: DC
  Administered 2015-10-07: 10 mg via ORAL
  Filled 2015-10-06: qty 1

## 2015-10-06 MED ORDER — SODIUM CHLORIDE 0.9 % IV SOLN: 250.0000 mL | INTRAVENOUS | Status: DC | PRN

## 2015-10-06 MED ORDER — OXYCODONE HCL 5 MG PO TABS: 5.0000 mg | ORAL_TABLET | ORAL | Status: DC | PRN

## 2015-10-06 MED ORDER — FUROSEMIDE 10 MG/ML IJ SOLN
20.0000 mg | Freq: Once | INTRAMUSCULAR | Status: AC
  Administered 2015-10-07: 20 mg via INTRAVENOUS
  Filled 2015-10-06: qty 4

## 2015-10-06 MED ORDER — IPRATROPIUM-ALBUTEROL 0.5-2.5 (3) MG/3ML IN SOLN: 3.0000 mL | RESPIRATORY_TRACT | Status: DC

## 2015-10-06 MED ORDER — ASPIRIN EC 81 MG PO TBEC: 81.0000 mg | DELAYED_RELEASE_TABLET | Freq: Every day | ORAL | Status: DC

## 2015-10-06 MED ORDER — FUROSEMIDE 10 MG/ML IJ SOLN
60.0000 mg | Freq: Once | INTRAMUSCULAR | Status: AC
  Administered 2015-10-06: 60 mg via INTRAVENOUS
  Filled 2015-10-06: qty 6

## 2015-10-06 NOTE — ED Notes (Signed)
With dyspnea after speaking, pt noted to have exp wheezing bilaterly

## 2015-10-06 NOTE — Progress Notes (Signed)
Patient arrived via [facility] reporting no pain. On 4L very SOB. Using restroom and oriented to the room and equipment. .Will continue to monitor. Tonya Hogan E, [place] 10/06/2015 2211

## 2015-10-06 NOTE — ED Notes (Signed)
1+ pitting edema noted at both ankles, pt states unable to put shoes on

## 2015-10-06 NOTE — H&P (Signed)
[facility] History and Physical  Tonya Hogan [id number] DOB: 1951/01/09 DOA: 10/06/2015  Referring physician: ED physician PCP: No PCP Per Patient  Specialists:   Chief Complaint: Shortness of breath and left facial droop  HPI: Tonya Hogan is a 64 y.o. female with PMH of hypertension, thyroid cancer, hypothyroidism, CAD, s/p CABG, bipolar, obesity, COPD, chronic anemia, diastolic congestive heart failure with EF 55-60% and grade 2 diastolic dysfunction, depression, bipolar disorder, who presents with shortness of breath and left facial droop.  Patient reports that she has been having shortness of breath during the past 7 days, which has been progressively getting worse. She has cough with yellow-colored sputum production and wheezing. She has chest tightness, but no obvious chest pain. She does not have fever or chills. She also reports having left facial droop which started at 1 PM. She reports that she has chronic right extremity weakness which has not changed. No vision change or hearing loss. No numbness of in her extremities. Patient does not have abdominal pain, diarrhea, symptoms of a UTI. She has bilateral leg edema.  In ED, patient was found to have WBC 5.6, BNP 532.1, INR 1.13, PTT 27, temperature normal, oxygen saturation to 88% on room air, electrolytes okay, renal function okay. Chest x-ray showed interstitial pulmonary edema. CT head is negative for acute intracranial abnormalities. The patient is admitted to inpatient for further evaluation treatment.  Where does patient live?   At home    Can patient participate in ADLs?  Yes   Review of Systems:   General: no fevers, chills, no changes in body weight, has poor appetite, has fatigue HEENT: no blurry vision, hearing changes or sore throat Pulm: has dyspnea, coughing, wheezing CV: no chest pain, palpitations Abd: no nausea, vomiting, abdominal pain, diarrhea, constipation GU: no dysuria, burning on urination,  increased urinary frequency, hematuria  Ext: has leg edema Neuro: no unilateral weakness, numbness, or tingling, no vision change or hearing loss. Has left facial droop. Skin: no rash MSK: No muscle spasm, no deformity, no limitation of range of movement in spin Heme: No easy bruising.  Travel history: No recent long distant travel.  Allergy:  Allergies  Allergen Reactions  . Chocolate     Sneezing, coughing  . Peanut-Containing Drug Products     Sneezing,coughing    Past Medical History  Diagnosis Date  . Depression   . Hypertension   . Thyroid disease   . Thyroid ca ([place])   . S/P CABG x 3   . Arthritis   . Obesity   . Bipolar 1 disorder ([place])   . CHF (congestive heart failure) ([place])   . Hypothyroidism   . CAD (coronary artery disease)     Past Surgical History  Procedure Laterality Date  . Coronary artery bypass graft    . Tonsillectomy    . Abdominal hysterectomy      Social History:  reports that she has never smoked. She has never used smokeless tobacco. She reports that she does not drink alcohol or use illicit drugs.  Family History:  Family History  Problem Relation Age of Onset  . Hypertension Sister   . Bipolar disorder Other      Prior to Admission medications   Medication Sig Start Date End Date Taking? Authorizing Provider  albuterol (PROVENTIL HFA;VENTOLIN HFA) 108 (90 BASE) MCG/ACT inhaler Inhale 2 puffs into the lungs every 6 (six) hours as needed for wheezing or shortness of breath. 06/26/15   Donne Hazel, MD  citalopram (CELEXA) 10 MG tablet Take 10 mg by mouth daily.    Historical Provider, MD  Divalproex Sodium (DEPAKOTE PO) Take by mouth 2 (two) times daily.    Historical Provider, MD  ferrous sulfate 325 (65 FE) MG tablet Take 325 mg by mouth daily with breakfast.    Historical Provider, MD  furosemide (LASIX) 40 MG tablet Take 1 tablet (40 mg total) by mouth daily. 06/26/15   Donne Hazel, MD  gabapentin (NEURONTIN) 100 MG capsule Take  200 mg by mouth at bedtime.    Historical Provider, MD  hydrOXYzine (ATARAX/VISTARIL) 25 MG tablet Take 25 mg by mouth 3 (three) times daily as needed for anxiety or itching.     Historical Provider, MD  levothyroxine (SYNTHROID, LEVOTHROID) 125 MCG tablet Take 125 mcg by mouth daily before breakfast.    Historical Provider, MD  lisinopril (PRINIVIL,ZESTRIL) 20 MG tablet Take 20 mg by mouth daily.    Historical Provider, MD  miconazole (MICONAZOLE 7) 2 % vaginal cream Place 1 Applicatorful vaginally at bedtime.    Historical Provider, MD  naproxen sodium (ANAPROX) 220 MG tablet Take 440 mg by mouth 2 (two) times daily as needed (pain).    Historical Provider, MD  oxyCODONE (OXY IR/ROXICODONE) 5 MG immediate release tablet Take 1 tablet (5 mg total) by mouth every 4 (four) hours as needed for severe pain. 06/26/15   Donne Hazel, MD  predniSONE (DELTASONE) 5 MG tablet Take 1 tablet (5 mg total) by mouth daily with breakfast. 06/26/15   Donne Hazel, MD  RISPERIDONE PO Take by mouth at bedtime.    Historical Provider, MD  sertraline (ZOLOFT) 25 MG tablet Take 25 mg by mouth daily.    Historical Provider, MD  zolpidem (AMBIEN) 5 MG tablet Take 5 mg by mouth at bedtime as needed for sleep.    Historical Provider, MD    Physical Exam: Filed Vitals:   10/07/15 0359 10/07/15 0400 10/07/15 0500 10/07/15 0600  BP:  126/93  108/67  Pulse:  72  78  Temp:  98.1 F (36.7 C)  97.7 F (36.5 C)  TempSrc:  Axillary  Axillary  Resp: 20 18  20   Height:      Weight:   122.743 kg (270 lb 9.6 oz)   SpO2:  98%  96%   General: Not in acute distress HEENT:       Eyes: PERRL, EOMI, no scleral icterus.       ENT: No discharge from the ears and nose, no pharynx injection, no tonsillar enlargement.        Neck: Difficult to assess JVD due to obesity, no bruit, no mass felt. Heme: No neck lymph node enlargement. Cardiac: S1/S2, RRR, No murmurs, No gallops or rubs. Pulm: has wheezing bilaterally.  No rales or  rubs. Abd: Soft, nondistended, nontender, no rebound pain, no organomegaly, BS present. Ext: 1+ pitting leg edema bilaterally. 2+DP/PT pulse bilaterally. Musculoskeletal: No joint deformities, No joint redness or warmth, no limitation of ROM in spin. Skin: No rashes.  Neuro: Alert, oriented X3, cranial nerves II-XII grossly intact, muscle strength 5/5 in all extremities, sensation to light touch intact. Brachial reflex 2+ bilaterally. Knee reflex 1+ bilaterally. Negative Babinski's sign. Normal finger to nose test. Psych: Patient is not psychotic, no suicidal or hemocidal ideation.  Labs on Admission:  Basic Metabolic Panel:  Recent Labs Lab 10/06/15 1800 10/07/15 0010  NA 144  --   K 3.8  --   CL 99*  --  CO2 38*  --   GLUCOSE 92  --   BUN 21*  --   CREATININE 0.83 0.91  CALCIUM 8.8*  --    Liver Function Tests:  Recent Labs Lab 10/06/15 1800  AST 13*  ALT 17  ALKPHOS 63  BILITOT 0.6  PROT 7.8  ALBUMIN 3.4*   No results for input(s): LIPASE, AMYLASE in the last 168 hours. No results for input(s): AMMONIA in the last 168 hours. CBC:  Recent Labs Lab 10/06/15 1800 10/07/15 0010  WBC 5.6 6.0  NEUTROABS 3.4  --   HGB 9.7* 10.0*  HCT 34.2* 34.3*  MCV 86.4 86.0  PLT 243 217   Cardiac Enzymes:  Recent Labs Lab 10/06/15 1800 10/07/15 0010  TROPONINI <0.03 <0.03    BNP (last 3 results)  Recent Labs  06/22/15 1743 09/06/15 1440 10/06/15 1800  BNP 147.9* 342.5* 532.1*    ProBNP (last 3 results) No results for input(s): PROBNP in the last 8760 hours.  CBG:  Recent Labs Lab 10/06/15 1826  GLUCAP 75    Radiological Exams on Admission: Dg Chest 2 View  10/06/2015  CLINICAL DATA:  Shortness of breath with weakness and central chest pain, 1 month duration. EXAM: CHEST  2 VIEW COMPARISON:  09/06/2015 FINDINGS: Previous median sternotomy. The heart is enlarged. There is venous hypertension with interstitial edema. No visible effusion. No lobar  collapse. No acute bone finding. IMPRESSION: Cardiomegaly and interstitial pulmonary edema. Electronically Signed   By: Nelson Chimes M.D.   On: 10/06/2015 19:22   Ct Head Wo Contrast  10/06/2015  CLINICAL DATA:  Pt co sob x months, pt co left side facial droop around 2 o'clock p.m. today EXAM: CT HEAD WITHOUT CONTRAST TECHNIQUE: Contiguous axial images were obtained from the base of the skull through the vertex without intravenous contrast. COMPARISON:  None. FINDINGS: There is no intra or extra-axial fluid collection or mass lesion. The basilar cisterns and ventricles have a normal appearance. There is no CT evidence for acute infarction or hemorrhage. Bone windows are unremarkable. IMPRESSION: No evidence for acute  abnormality. Electronically Signed   By: Nolon Nations M.D.   On: 10/06/2015 19:29    EKG: Independently reviewed.  QTC 476, T wave inversion in V2-V4 and inferior disease.  Assessment/Plan Principal Problem:   Acute on chronic respiratory failure with hypoxia (HCC) Active Problems:   Essential hypertension   Chronic anemia   Shortness of breath   COPD (chronic obstructive pulmonary disease) (HCC)   Bipolar affective disorder, manic (HCC)   Facial droop   Acute on chronic diastolic heart failure (HCC)   Hypothyroidism   S/P CABG x 3   CAD (coronary artery disease)   CHF exacerbation (HCC)   Acute on chronic respiratory failure ([place]): This is most likely due to combination of CHF exacerbation (given elevated BNP, pulmonary edema by chest x-ray) and CODP exacerbation (given bilateral wheezing on auscultation).   -will admit tele bed  -treat CHF and COPD exaxerbation as below  Acute on chronic diastolic congestive heart failure: 2 echo 06/24/15 showed EF 55-60% with grade 2 diastolic dysfunction. He has an elevated BNP and pulmonary edema by chest x-ray, consistent with CHF exacerbation -Lasix 60 mg by IV bid (tonight pt received 60 + 20 mg) -trop x 3 -2d echo -will  continue home Coreg -Aspirin and  lisinopril -Daily weights -strict I/O's -Low salt diet  COPD exacerbation: -Nebulizers: scheduled Duoneb and prn albuterol -Solu-Medrol 60 mg IV daily  -Oral azithromycin  for 5 days.  -Mucinex for cough  -Sputum culture  Essential hypertension: -On Lasix -Lisinopril  Chronic anemia: Hemoglobin stable. 9.5 on 09/06/15--> 9.7 -Follow-up by CBC  Bipolar affective disorder, manic (HCC) and depression: stable. no suicidal or homicidal ideations. -Continue home medications: Celexa, Depakote, risperidone, Zoloft  Hypothyroidism: Last TSH was 2.715 on 06/23/15 TSH -Continue Synthroid  CAD: s/p CABG. patient has mild chest tightness, which is likely due to CHF exacerbation. -follow up Trop x 3 as above -ASA  Facial droop: On my examination, patient does not have facial droop. No focal neurologic findings. I discussed with neurology, Dr. Janann Colonel who agreed to get MRI. If negative, no further workup needed. -ASA -MRI-brain  DVT ppx: SQ Heparin    Code Status: Full code Family Communication: None at bed side.    Disposition Plan: Admit to inpatient   Date of Service 10/07/2015    Ivor Costa [facility] Pager [phone]  If 7PM-7AM, please contact night-coverage [email or web address] Password [facility] 10/07/2015, 6:38 AM

## 2015-10-06 NOTE — ED Notes (Signed)
Pt becomes very dyspneic upon speaking

## 2015-10-06 NOTE — Progress Notes (Signed)
Pending admission:  64 year old lady with a past medical history of thyroid cancer, CAD, s/p CABG, bipolar disorder, obesity, COPD, diastolic congestive heart failure (EF 55-60% with grade 2 diastolic dysfunction), hypertension, hypothyroidism, depression, who presents with the shortness of breath, cough and wheezing, facial droop, bilateral leg edema.  Patient has been having shortness of breath for nearly a month, weakness for one week. She was noted to have facial droop at 1 PM. Per EDP, patient has bilateral 1+ pitting leg edema, but no facial droop on physical examination. Patient's oxygen was desaturated to 87%, which improved to 93% on 2 L nasal cannula. Patient has wheezing on lung auscultation. Neurology, Dr. Janann Colonel was consulted by EDP.   In mergency room, pt was found to have BNP 532, troponin negative, INR 1.13, PTT 27, WBC 5.7, temperature normal, tachypnea, renal function okay, CXR showed interstitial pulmonary edema. One dose of Lasix, 40 mg was given in the ED. Patient is accepted to Tele bed.   Ivor Costa

## 2015-10-06 NOTE — ED Notes (Signed)
Presents today with SOB and weakness, is on home oxygen, 2lpm via [place], is 2-3 pillow orthopneic, having to sleep in a chair, speech has also changed per daughter, where speech has been slurred.

## 2015-10-06 NOTE — ED Provider Notes (Signed)
CSN: [id number]     Arrival date & time 10/06/15  1727 History  By signing my name below, I, Helane Gunther, attest that this documentation has been prepared under the direction and in the presence of Harvel Quale, MD. Electronically Signed: Helane Gunther, ED Scribe. 10/06/2015. 6:55 PM.    Chief Complaint  Patient presents with  . Weakness   The history is provided by the patient and a relative. No language interpreter was used.   HPI Comments: Tonya Hogan is a 64 y.o. female with a PMHx of HTN, thyroid disease, and thyroid cancer, as well as a PSHx of CABG who presents to the Emergency Department complaining of weakness onset 1 week ago. Per daughter, pt looked as though the left side of her jaw looked swollen about 4 hours ago, and pt states it felt as though her mouth was "twisted" and she was concerned for a possible stroke. She reports associated worsening SOB, wheezing, and leg swelling. Pt states she has been feeling sick for the past few days. She notes she is on oxygen at home, but does not receive any breathing treatments. Per daughter, pt was on 2 L previously, but has gone up from 3 to 5L within the past few days. She reports a PMHx of stroke and CHF. She also notes a yeast infection under her breasts, for which she has applied powder with temporary relief. Per daughter, pt uses the powder every day and the yeast will go away for a time before returning once more.   Past Medical History  Diagnosis Date  . Depression   . Hypertension   . Thyroid disease   . Thyroid ca ([place])   . S/P CABG x 3   . Arthritis   . Obesity   . Bipolar 1 disorder ([place])   . CHF (congestive heart failure) ([place])   . Hypothyroidism   . CAD (coronary artery disease)    Past Surgical History  Procedure Laterality Date  . Coronary artery bypass graft    . Tonsillectomy    . Abdominal hysterectomy     Family History  Problem Relation Age of Onset  . Hypertension Sister   . Bipolar disorder  Other    Social History  Substance Use Topics  . Smoking status: Never Smoker   . Smokeless tobacco: Never Used  . Alcohol Use: No   OB History    No data available     Review of Systems  Constitutional: Positive for activity change and fatigue. Negative for fever and chills.  Eyes: Negative for pain and redness.  Respiratory: Positive for shortness of breath and wheezing. Negative for apnea and chest tightness.   Cardiovascular: Positive for leg swelling.  Gastrointestinal: Negative for nausea, vomiting, abdominal pain and diarrhea.  Genitourinary: Negative for dysuria, urgency, frequency and hematuria.  Musculoskeletal: Negative for myalgias and back pain.  Skin: Positive for rash.  Neurological: Positive for weakness. Negative for dizziness, syncope, numbness and headaches.    Allergies  Chocolate and Peanut-containing drug products  Home Medications   Prior to Admission medications   Medication Sig Start Date End Date Taking? Authorizing Provider  albuterol (PROVENTIL HFA;VENTOLIN HFA) 108 (90 BASE) MCG/ACT inhaler Inhale 2 puffs into the lungs every 6 (six) hours as needed for wheezing or shortness of breath. 06/26/15   Donne Hazel, MD  citalopram (CELEXA) 10 MG tablet Take 10 mg by mouth daily.    Historical Provider, MD  Divalproex Sodium (DEPAKOTE PO) Take by mouth  2 (two) times daily.    Historical Provider, MD  ferrous sulfate 325 (65 FE) MG tablet Take 325 mg by mouth daily with breakfast.    Historical Provider, MD  furosemide (LASIX) 40 MG tablet Take 1 tablet (40 mg total) by mouth daily. 06/26/15   Donne Hazel, MD  gabapentin (NEURONTIN) 100 MG capsule Take 200 mg by mouth at bedtime.    Historical Provider, MD  hydrOXYzine (ATARAX/VISTARIL) 25 MG tablet Take 25 mg by mouth 3 (three) times daily as needed for anxiety or itching.     Historical Provider, MD  levothyroxine (SYNTHROID, LEVOTHROID) 125 MCG tablet Take 125 mcg by mouth daily before breakfast.     Historical Provider, MD  lisinopril (PRINIVIL,ZESTRIL) 20 MG tablet Take 20 mg by mouth daily.    Historical Provider, MD  miconazole (MICONAZOLE 7) 2 % vaginal cream Place 1 Applicatorful vaginally at bedtime.    Historical Provider, MD  naproxen sodium (ANAPROX) 220 MG tablet Take 440 mg by mouth 2 (two) times daily as needed (pain).    Historical Provider, MD  oxyCODONE (OXY IR/ROXICODONE) 5 MG immediate release tablet Take 1 tablet (5 mg total) by mouth every 4 (four) hours as needed for severe pain. 06/26/15   Donne Hazel, MD  predniSONE (DELTASONE) 5 MG tablet Take 1 tablet (5 mg total) by mouth daily with breakfast. 06/26/15   Donne Hazel, MD  RISPERIDONE PO Take by mouth at bedtime.    Historical Provider, MD  sertraline (ZOLOFT) 25 MG tablet Take 25 mg by mouth daily.    Historical Provider, MD  zolpidem (AMBIEN) 5 MG tablet Take 5 mg by mouth at bedtime as needed for sleep.    Historical Provider, MD   BP 146/72 mmHg  Pulse 74  Temp(Src) 98.4 F (36.9 C) (Oral)  Resp 20  Ht 5\' 5"  (1.651 m)  Wt 270 lb 9.6 oz (122.743 kg)  BMI 45.03 kg/m2  SpO2 92% Physical Exam  Constitutional: She is oriented to person, place, and time. She appears well-developed and well-nourished. No distress. Nasal cannula in place.  HENT:  Head: Normocephalic and atraumatic.  Right Ear: External ear normal.  Left Ear: External ear normal.  Nose: Nose normal.  Mouth/Throat: Oropharynx is clear and moist. No oropharyngeal exudate.  Eyes: EOM are normal. Pupils are equal, round, and reactive to light.  Neck: Normal range of motion. Neck supple.  Cardiovascular: Normal rate, regular rhythm, normal heart sounds and intact distal pulses.   Pulmonary/Chest: Effort normal. No respiratory distress. She has decreased breath sounds. She has rales (few scattered).  Abdominal: Soft. She exhibits no distension. There is no tenderness.  Musculoskeletal: Normal range of motion. She exhibits edema (2+, bilateral,  symmetric). She exhibits no tenderness.  Neurological: She is alert and oriented to person, place, and time. She has normal strength. No sensory deficit. She exhibits normal muscle tone. Coordination normal.  Normal finger to nose on examination.  CN intact other than right sided facial droop present.  NIHSS 1.  Onset of symptoms >5 hour  Skin: Skin is warm and dry. Rash (fungal appearance in the areas depicted in red on the diagram) noted. She is not diaphoretic.     Vitals reviewed.   ED Course  Procedures  DIAGNOSTIC STUDIES: Oxygen Saturation is 88% on 2 L/min [place], low by my interpretation.    COORDINATION OF CARE: 6:54 PM - Discussed plans to order diagnostic studies and imaging. Pt advised of plan for treatment and  pt agrees.  Labs Review Labs Reviewed  CBC - Abnormal; Notable for the following:    Hemoglobin 9.7 (*)    HCT 34.2 (*)    MCH 24.5 (*)    MCHC 28.4 (*)    RDW 19.3 (*)    All other components within normal limits  COMPREHENSIVE METABOLIC PANEL - Abnormal; Notable for the following:    Chloride 99 (*)    CO2 38 (*)    BUN 21 (*)    Calcium 8.8 (*)    Albumin 3.4 (*)    AST 13 (*)    All other components within normal limits  URINALYSIS, ROUTINE W REFLEX MICROSCOPIC (NOT AT [facility]) - Abnormal; Notable for the following:    Leukocytes, UA TRACE (*)    All other components within normal limits  BRAIN NATRIURETIC PEPTIDE - Abnormal; Notable for the following:    B Natriuretic Peptide 532.1 (*)    All other components within normal limits  BASIC METABOLIC PANEL - Abnormal; Notable for the following:    Potassium 3.4 (*)    Chloride 93 (*)    CO2 40 (*)    Calcium 8.7 (*)    All other components within normal limits  CBC - Abnormal; Notable for the following:    Hemoglobin 10.0 (*)    HCT 34.3 (*)    MCH 25.1 (*)    MCHC 29.2 (*)    RDW 19.2 (*)    All other components within normal limits  LIPID PANEL - Abnormal; Notable for the following:    Cholesterol  213 (*)    LDL Cholesterol 151 (*)    All other components within normal limits  CULTURE, EXPECTORATED SPUTUM-ASSESSMENT  PROTIME-INR  APTT  DIFFERENTIAL  TROPONIN I  URINE MICROSCOPIC-ADD ON  MAGNESIUM  CREATININE, SERUM  TROPONIN I  TROPONIN I  HEMOGLOBIN A1C  TROPONIN I  CBG MONITORING, ED    Imaging Review Dg Chest 2 View  10/06/2015  CLINICAL DATA:  Shortness of breath with weakness and central chest pain, 1 month duration. EXAM: CHEST  2 VIEW COMPARISON:  09/06/2015 FINDINGS: Previous median sternotomy. The heart is enlarged. There is venous hypertension with interstitial edema. No visible effusion. No lobar collapse. No acute bone finding. IMPRESSION: Cardiomegaly and interstitial pulmonary edema. Electronically Signed   By: Nelson Chimes M.D.   On: 10/06/2015 19:22   Ct Head Wo Contrast  10/06/2015  CLINICAL DATA:  Pt co sob x months, pt co left side facial droop around 2 o'clock p.m. today EXAM: CT HEAD WITHOUT CONTRAST TECHNIQUE: Contiguous axial images were obtained from the base of the skull through the vertex without intravenous contrast. COMPARISON:  None. FINDINGS: There is no intra or extra-axial fluid collection or mass lesion. The basilar cisterns and ventricles have a normal appearance. There is no CT evidence for acute infarction or hemorrhage. Bone windows are unremarkable. IMPRESSION: No evidence for acute  abnormality. Electronically Signed   By: Nolon Nations M.D.   On: 10/06/2015 19:29    I have personally reviewed and evaluated these images and lab results as part of my medical decision-making.   EKG Interpretation   Date/Time:  Monday October 06 2015 18:04:46 EST Ventricular Rate:  53 PR Interval:  156 QRS Duration: 80 QT Interval:  508 QTC Calculation: 476 R Axis:   52 Text Interpretation:  Sinus bradycardia with sinus arrhythmia ST \\T \ T  wave abnormality, consider anterior ischemia Prolonged QT Abnormal ECG No  significant change since  last  tracing Reconfirmed by [place], EMILY ([id number])  on 10/07/2015 12:14:47 PM      MDM  Patient was seen and evaluated in stable condition.  Patient requiring increased [place] oxygen.  She did become hypoxic/dyspneic when ambulated to bathroom.  BNP elevated.  Pulm edema on chest xray.  REsults, hx, physical exam consistent with CHF exacerbation.  Patient given 60 mg IV lasix and did void large amounts of urine after.  NIHSS 1.  Discussed case with Dr. Janann Colonel from neurology who recommended MRI when patient able to under go study and said that neurology could see her in consultation after completion.  Discussed with Dr. Blaine Hamper who agreed with admission.  Patient admitted to telemetry under his care.  Patient and daughter updated on results and plan of care. Final diagnoses:  Acute on chronic congestive heart failure, unspecified congestive heart failure type (HCC)  Facial droop    1. CHF with exacerbation  2. Facial droop  I personally performed the services described in this documentation, which was scribed in my presence. The recorded information has been reviewed and is accurate.   Harvel Quale, MD 10/07/15 [phone]

## 2015-10-06 NOTE — ED Notes (Signed)
Pt placed on cont cardiac monitor with cont POX monitoring and int NBP

## 2015-10-06 NOTE — ED Notes (Addendum)
Weakness x1 week.  Family sts she noticed some facial drooping at 1pm.  Not able to appreciate a droop at this time.  Neuo intact. Pt also reports SOB x1 month.  Upon arrival pts O2 tank was empty. RA O2 87%.  O2 up to 93% on 2L [place].

## 2015-10-06 NOTE — ED Notes (Signed)
FAST assessment: negative 

## 2015-10-07 ENCOUNTER — Inpatient Hospital Stay (HOSPITAL_COMMUNITY): Payer: Medicare Other

## 2015-10-07 DIAGNOSIS — I509 Heart failure, unspecified: Secondary | ICD-10-CM

## 2015-10-07 DIAGNOSIS — I5023 Acute on chronic systolic (congestive) heart failure: Secondary | ICD-10-CM

## 2015-10-07 LAB — CBC
HEMATOCRIT: 34.3 % — AB (ref 36.0–46.0)
Hemoglobin: 10 g/dL — ABNORMAL LOW (ref 12.0–15.0)
MCH: 25.1 pg — ABNORMAL LOW (ref 26.0–34.0)
MCHC: 29.2 g/dL — AB (ref 30.0–36.0)
MCV: 86 fL (ref 78.0–100.0)
PLATELETS: 217 10*3/uL (ref 150–400)
RBC: 3.99 MIL/uL (ref 3.87–5.11)
RDW: 19.2 % — ABNORMAL HIGH (ref 11.5–15.5)
WBC: 6 10*3/uL (ref 4.0–10.5)

## 2015-10-07 LAB — LIPID PANEL
CHOL/HDL RATIO: 4.2 ratio
Cholesterol: 213 mg/dL — ABNORMAL HIGH (ref 0–200)
HDL: 51 mg/dL (ref >40)
LDL CALC: 151 mg/dL — AB (ref 0–99)
Triglycerides: 54 mg/dL (ref <150)
VLDL: 11 mg/dL (ref 0–40)

## 2015-10-07 LAB — CREATININE, SERUM
CREATININE: 0.91 mg/dL (ref 0.44–1.00)
GFR calc non Af Amer: >60 mL/min (ref >60)

## 2015-10-07 LAB — BASIC METABOLIC PANEL
Anion gap: 9 (ref 5–15)
BUN: 14 mg/dL (ref 6–20)
CHLORIDE: 93 mmol/L — AB (ref 101–111)
CO2: 40 mmol/L — ABNORMAL HIGH (ref 22–32)
CREATININE: 0.78 mg/dL (ref 0.44–1.00)
Calcium: 8.7 mg/dL — ABNORMAL LOW (ref 8.9–10.3)
GFR calc Af Amer: >60 mL/min (ref >60)
GLUCOSE: 86 mg/dL (ref 65–99)
Potassium: 3.4 mmol/L — ABNORMAL LOW (ref 3.5–5.1)
SODIUM: 142 mmol/L (ref 135–145)

## 2015-10-07 LAB — TROPONIN I: Troponin I: <0.03 ng/mL (ref <0.031)

## 2015-10-07 LAB — MAGNESIUM: MAGNESIUM: 1.8 mg/dL (ref 1.7–2.4)

## 2015-10-07 MED ORDER — POTASSIUM CHLORIDE CRYS ER 20 MEQ PO TBCR
20.0000 meq | EXTENDED_RELEASE_TABLET | Freq: Two times a day (BID) | ORAL | Status: DC
  Administered 2015-10-07 – 2015-10-09 (×5): 20 meq via ORAL
  Filled 2015-10-07 (×5): qty 1

## 2015-10-07 MED ORDER — ALBUTEROL SULFATE (2.5 MG/3ML) 0.083% IN NEBU: 2.5000 mg | INHALATION_SOLUTION | RESPIRATORY_TRACT | Status: DC | PRN

## 2015-10-07 MED ORDER — DOCUSATE SODIUM 100 MG PO CAPS
200.0000 mg | ORAL_CAPSULE | Freq: Two times a day (BID) | ORAL | Status: DC
  Administered 2015-10-07 – 2015-10-09 (×4): 200 mg via ORAL
  Filled 2015-10-07 (×5): qty 2

## 2015-10-07 MED ORDER — ATORVASTATIN CALCIUM 40 MG PO TABS
40.0000 mg | ORAL_TABLET | Freq: Every day | ORAL | Status: DC
  Administered 2015-10-07 – 2015-10-09 (×3): 40 mg via ORAL
  Filled 2015-10-07 (×3): qty 1

## 2015-10-07 MED ORDER — METHYLPREDNISOLONE SODIUM SUCC 125 MG IJ SOLR
60.0000 mg | Freq: Every day | INTRAMUSCULAR | Status: DC
  Administered 2015-10-07 – 2015-10-08 (×2): 60 mg via INTRAVENOUS
  Filled 2015-10-07 (×2): qty 2

## 2015-10-07 MED ORDER — AZITHROMYCIN 250 MG PO TABS
250.0000 mg | ORAL_TABLET | Freq: Every day | ORAL | Status: DC
  Administered 2015-10-08 – 2015-10-09 (×2): 250 mg via ORAL
  Filled 2015-10-07 (×2): qty 1

## 2015-10-07 MED ORDER — IPRATROPIUM-ALBUTEROL 0.5-2.5 (3) MG/3ML IN SOLN
3.0000 mL | Freq: Three times a day (TID) | RESPIRATORY_TRACT | Status: DC
Start: 2015-10-07 — End: 2015-10-09
  Administered 2015-10-07 – 2015-10-09 (×7): 3 mL via RESPIRATORY_TRACT
  Filled 2015-10-07 (×7): qty 3

## 2015-10-07 MED ORDER — FUROSEMIDE 10 MG/ML IJ SOLN
60.0000 mg | Freq: Two times a day (BID) | INTRAMUSCULAR | Status: DC
  Administered 2015-10-07 – 2015-10-09 (×4): 60 mg via INTRAVENOUS
  Filled 2015-10-07 (×4): qty 8

## 2015-10-07 MED ORDER — METOCLOPRAMIDE HCL 5 MG/ML IJ SOLN: 5.0000 mg | Freq: Four times a day (QID) | INTRAMUSCULAR | Status: DC | PRN

## 2015-10-07 MED ORDER — SERTRALINE HCL 50 MG PO TABS
25.0000 mg | ORAL_TABLET | Freq: Every day | ORAL | Status: DC
  Administered 2015-10-08 – 2015-10-09 (×2): 25 mg via ORAL
  Filled 2015-10-07 (×2): qty 1

## 2015-10-07 MED ORDER — AZITHROMYCIN 500 MG PO TABS
500.0000 mg | ORAL_TABLET | Freq: Every day | ORAL | Status: AC
Start: 2015-10-07 — End: 2015-10-07
  Administered 2015-10-07: 500 mg via ORAL
  Filled 2015-10-07 (×2): qty 1

## 2015-10-07 NOTE — Care Management Note (Addendum)
Case Management Note  Patient Details  Name: Tonya Hogan MRN: [id number] Date of Birth: [birth year]  Subjective/Objective:                    Action/Plan: Patient admitted with acute on chronic respiratory failure with hypoxia. Patient is from home with family. Pt receiving antibiotics, IV steroids and Lasix. Pt requiring O2 in the hospital but does not use home oxygen. Patient without a PCP and is asking for medication assistance at home. CM will follow the previous needs and any further discharge needs.  CM spoke with Dr Coralyn Pear and he is interested in having the CHF navigator come and speak/ educate the patient on her CHF. CM called Daphne the CHF navigator and asked her if she would come and see the patient.   Expected Discharge Date:                  Expected Discharge Plan:     In-House Referral:     Discharge planning Services     Post Acute Care Choice:    Choice offered to:     DME Arranged:    DME Agency:     HH Arranged:    HH Agency:     Status of Service:  In process, will continue to follow  Medicare Important Message Given:    Date Medicare IM Given:    Medicare IM give by:    Date Additional Medicare IM Given:    Additional Medicare Important Message give by:     If discussed at [place] of Stay Meetings, dates discussed:    Additional Comments:  Pollie Friar, RN 10/07/2015, 11:19 AM

## 2015-10-07 NOTE — Evaluation (Signed)
Physical Therapy Evaluation Patient Details Name: Tonya Hogan MRN: [id number] DOB: March 18, 1951 Today's Date: 10/07/2015   History of Present Illness  Pt is a 64 y/o female with a PMH of HTN, thyroid cancer, hypothyroidism, CAD s/p CABG, COPD, CHF EF 55-60%. Pt presents with SOB and L facial droop. Imaging reveals no acute abnormalities.  Clinical Impression  Pt admitted with above diagnosis. Pt currently with functional limitations due to the deficits listed below (see PT Problem List). At the time of PT eval pt was able to perform transfers and ambulation with supervision for safety. Pt utilized RW for balance support as well as energy conservation. Pt will benefit from skilled PT to increase their independence and safety with mobility to allow discharge to the venue listed below.    Pt on 4L/min supplemental O2 throughout session. After gait training sats at 97%, and pt mildly SOB.      Follow Up Recommendations Home health PT;Supervision for mobility/OOB    Equipment Recommendations  Rolling walker with 5" wheels;3in1 (PT)    Recommendations for Other Services       Precautions / Restrictions Precautions Precautions: Fall Restrictions Weight Bearing Restrictions: No      Mobility  Bed Mobility               General bed mobility comments: Pt sitting EOB when PT arrived.   Transfers Overall transfer level: Needs assistance Equipment used: Rolling walker (2 wheeled) Transfers: Sit to/from Stand Sit to Stand: Supervision         General transfer comment: Supervision for safety. No imablances noted.   Ambulation/Gait Ambulation/Gait assistance: Supervision Ambulation Distance (Feet): 250 Feet Assistive device: Rolling walker (2 wheeled) Gait Pattern/deviations: Step-through pattern;Decreased stride length;Trendelenburg Gait velocity: Decreased Gait velocity interpretation: Below normal speed for age/gender General Gait Details: Good management of RW. No  unsteadiness or LOB noted with UE support.   Stairs            Wheelchair Mobility    Modified Rankin (Stroke Patients Only)       Balance Overall balance assessment: Needs assistance Sitting-balance support: Feet supported;No upper extremity supported Sitting balance-Leahy Scale: Good     Standing balance support: No upper extremity supported;During functional activity Standing balance-Leahy Scale: Fair                               Pertinent Vitals/Pain Pain Assessment: No/denies pain    Home Living Family/patient expects to be discharged to:: Private residence Living Arrangements: Children;Other relatives Available Help at Discharge: Family;Available PRN/intermittently Type of Home: Apartment Home Access: Level entry     Home Layout: One level Home Equipment: None      Prior Function Level of Independence: Independent               Hand Dominance   Dominant Hand: Right    Extremity/Trunk Assessment   Upper Extremity Assessment: Defer to OT evaluation           Lower Extremity Assessment: Generalized weakness      Cervical / Trunk Assessment: Normal  Communication   Communication: No difficulties  Cognition Arousal/Alertness: Awake/alert Behavior During Therapy: WFL for tasks assessed/performed Overall Cognitive Status: Within Functional Limits for tasks assessed                      General Comments      Exercises        Assessment/Plan  PT Assessment Patient needs continued PT services  PT Diagnosis Difficulty walking   PT Problem List Decreased strength;Decreased range of motion;Decreased activity tolerance;Decreased balance;Decreased mobility;Decreased knowledge of use of DME;Decreased safety awareness;Decreased knowledge of precautions;Cardiopulmonary status limiting activity  PT Treatment Interventions DME instruction;Gait training;Functional mobility training;Stair training;Therapeutic  activities;Therapeutic exercise;Neuromuscular re-education;Patient/family education   PT Goals (Current goals can be found in the Care Plan section) Acute Rehab PT Goals Patient Stated Goal: Home with family PT Goal Formulation: With patient Time For Goal Achievement: 10/14/15 Potential to Achieve Goals: Good    Frequency Min 3X/week   Barriers to discharge        Co-evaluation               End of Session Equipment Utilized During Treatment: Gait belt;Oxygen Activity Tolerance: Patient tolerated treatment well Patient left: in chair;with call bell/phone within reach;with chair alarm set Nurse Communication: Mobility status         Time: 1324-1350 PT Time Calculation (min) (ACUTE ONLY): 26 min   Charges:   PT Evaluation $Initial PT Evaluation Tier I: 1 Procedure PT Treatments $Gait Training: 8-22 mins   PT G Codes:        Rolinda Roan 10-19-2015, 2:20 PM  Rolinda Roan, PT, DPT Acute Rehabilitation Services Pager: [phone]

## 2015-10-07 NOTE — Progress Notes (Signed)
CM met with the patient concerning her not having a PCP, trouble with meds and transportation issues. Patient has Medicaid as a Consulting civil engineer. Medicaid assigns an MD to the patient. CM asked Ms Diedrich to look at her Medicaid card to see who her assigned PCP is. Patient states her card is at home but will have her daughter look at the card. Ms Wooton states she does not have trouble affording her medications. She says she is able to obtain her meds for a very low cost at [facility]. CM also addressed her transportation issues. Ms Pendry states she and her daughter do not have a car. CM informed her that she could register for [facility] with her Medicaid. Patient very interested in using [facility] and is going to call and register. CM will continue to follow for further discharge needs.

## 2015-10-07 NOTE — Progress Notes (Signed)
[facility] PROGRESS NOTE  Shristi Biter [id number] DOB: [birth year] DOA: 10/06/2015 PCP: No PCP Per Patient  Assessment/Plan:  Acute on Chronic Diastolic CHF -Patient presented with SOB, cough and worsening bilateral lower extremity edema  -BNP in ED elevated at 532, CXR with bilateral interstitial pulmonary edema  -Echo performed 06/24/15 with EF 55-60% and grade 2 diastolic dysfunction  -Repeat echo pending at time of this note  -Appears to be approximately 15 pounds above her dry weight, 264 today up from 249 on discharge in 06/2015 -This is the patient's second admission in 6 months for CHF exacerbation. Multiple social stressors and concerns for compliance with treatment as an outpatient. Social work is Financial risk analyst to determine home health options. Discussed case with heart failure navigator who is working with the patient to plan outpatient follow-up. -Continue Lasix 60 mg IV BID, Coreg, ASA and Lisinopril  -Monitor strict I/Os, daily weights, low salt diet   COPD Exacerbation  -Presented with complaints of SOB and cough with productive yellow sputum  -O2 sats stable, patient is usually on 2 LPM O2 at home, requiring 4 LPM at this time  -Continue scheduled Duoneb and PRN Albuterol, Solumedrol 60 mg IV daily  -Sputum culture pending, treating with oral azithromycin for now  -Mucinex for cough  Facial Droop -Patient had no focal neurological deficits upon admission -MRI negative for acute infarct  -Neurology recommending no further workup  Hypokalemia -Potassium this morning 3.4, likely due to diuretic  -Started PO potassium, will recheck tomorrow   Hypertension  -BPs stable since admission -Continue Lasix, Lisinopril  Hyperlipidemia  -LDL 10/07/15 - 151  -Start Lipitor 40 mg   Prolonged QTC -EKG 11/15 with QTc of 476 -Patient on multiple psych meds, discontinued Zofran, continue to monitor   CAD S/P CABG -Denies chest pain at this time  -Cyclic troponins are  negative, EKG with no acute ischemic changes   Chronic Anemia  -Hemoglobin appears to be at the patient's baseline, 10.0 today   Bipolar Disorder, mixed  -Stable, no suicidal or homicidal ideations  -Continue Celexa, Depakote, Risperidone, Zoloft    Code Status: Full Code  Family Communication: No family present at bedside Disposition Plan: Patient admitted for CHF exacerbation, will continue to diurese with IV lasix.    Consultants:  None   Procedures:  None  Antibiotics:  None   HPI/Subjective: Ms. Murrietta is a 64 y/o female PMH of HTN, CAD s/p CABG, COPD, diastolic CHF with grade 2 diastolic dysfunction, hypothyroidism and obesity who presented to the ED 11/14 with complaints of SOB and left facial droop. She reported that she had been having worsening shortness of breath during the past 7 days accompanied by cough with yellow sputum and wheezing. Per her daughter, the patient also looked like the left side of her jaw was "swollen" and the patient felt like her mouth was twisted. Upon arrival to the ED the patient had no focal neurologic deficits and CT of the head was negative for acute changes. She was also found to have elevated BNP at 532 with CXR showing bilateral pulmonary edema. She was admitted to inpatient telemetry for further evaluation.   Objective: Filed Vitals:   10/07/15 0600  BP: 108/67  Pulse: 78  Temp: 97.7 F (36.5 C)  Resp: 20   No intake or output data in the 24 hours ending 10/07/15 0822 Filed Weights   10/06/15 1733 10/06/15 2224 10/07/15 0500  Weight: 117.935 kg (260 lb) 120.566 kg (265 lb 12.8 oz) 122.743 kg (  270 lb 9.6 oz)    Exam:   General:  Pleasant female, obese, sitting upright in bed, NAD   Cardiovascular: RRR, no murmurs or gallops  Respiratory: Faint bilateral expiratory wheezes, good air movement   Abdomen: Abdomen is soft, non-tender, BS present all 4 quadrants   Musculoskeletal: Non-focal.   Data Reviewed: Basic  Metabolic Panel:  Recent Labs Lab 10/06/15 1800 10/07/15 0010 10/07/15 0602  NA 144  --  142  K 3.8  --  3.4*  CL 99*  --  93*  CO2 38*  --  40*  GLUCOSE 92  --  86  BUN 21*  --  14  CREATININE 0.83 0.91 0.78  CALCIUM 8.8*  --  8.7*  MG  --   --  1.8   Liver Function Tests:  Recent Labs Lab 10/06/15 1800  AST 13*  ALT 17  ALKPHOS 63  BILITOT 0.6  PROT 7.8  ALBUMIN 3.4*   No results for input(s): LIPASE, AMYLASE in the last 168 hours. No results for input(s): AMMONIA in the last 168 hours. CBC:  Recent Labs Lab 10/06/15 1800 10/07/15 0010  WBC 5.6 6.0  NEUTROABS 3.4  --   HGB 9.7* 10.0*  HCT 34.2* 34.3*  MCV 86.4 86.0  PLT 243 217   Cardiac Enzymes:  Recent Labs Lab 10/06/15 1800 10/07/15 0010 10/07/15 0602  TROPONINI <0.03 <0.03 <0.03   BNP (last 3 results)  Recent Labs  06/22/15 1743 09/06/15 1440 10/06/15 1800  BNP 147.9* 342.5* 532.1*    ProBNP (last 3 results) No results for input(s): PROBNP in the last 8760 hours.  CBG:  Recent Labs Lab 10/06/15 1826  GLUCAP 75    No results found for this or any previous visit (from the past 240 hour(s)).   Studies: Dg Chest 2 View  10/06/2015  CLINICAL DATA:  Shortness of breath with weakness and central chest pain, 1 month duration. EXAM: CHEST  2 VIEW COMPARISON:  09/06/2015 FINDINGS: Previous median sternotomy. The heart is enlarged. There is venous hypertension with interstitial edema. No visible effusion. No lobar collapse. No acute bone finding. IMPRESSION: Cardiomegaly and interstitial pulmonary edema. Electronically Signed   By: Nelson Chimes M.D.   On: 10/06/2015 19:22   Ct Head Wo Contrast  10/06/2015  CLINICAL DATA:  Pt co sob x months, pt co left side facial droop around 2 o'clock p.m. today EXAM: CT HEAD WITHOUT CONTRAST TECHNIQUE: Contiguous axial images were obtained from the base of the skull through the vertex without intravenous contrast. COMPARISON:  None. FINDINGS: There is  no intra or extra-axial fluid collection or mass lesion. The basilar cisterns and ventricles have a normal appearance. There is no CT evidence for acute infarction or hemorrhage. Bone windows are unremarkable. IMPRESSION: No evidence for acute  abnormality. Electronically Signed   By: Nolon Nations M.D.   On: 10/06/2015 19:29   Mr Brain Wo Contrast  10/07/2015  CLINICAL DATA:  Left facial droop EXAM: MRI HEAD WITHOUT CONTRAST TECHNIQUE: Multiplanar, multiecho pulse sequences of the brain and surrounding structures were obtained without intravenous contrast. COMPARISON:  CT head 10/06/2015 FINDINGS: Negative for acute infarct. 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 Encephalomalacia left anterior temporal lobe consistent with infarction or prior trauma. Small chronic infarct right cerebellum. Scattered areas of chronic micro hemorrhage in the brain. No hematoma or fluid collection. Ventricle size normal. Cerebral volume normal. Pituitary normal in size. Negative for mass or edema.  No shift of the midline  structures Paranasal sinuses clear. IMPRESSION: Negative for acute infarct. Chronic ischemic changes in the left temporal lobe, left parietal lobe, and right cerebellum. Scattered areas of chronic micro hemorrhage, likely related to poorly controlled hypertension. Electronically Signed   By: Franchot Gallo M.D.   On: 10/07/2015 07:34    Scheduled Meds: . aspirin  300 mg Rectal Daily   Or  . aspirin  325 mg Oral Daily  . azithromycin  500 mg Oral Daily   Followed by  . [START ON 10/08/2015] azithromycin  250 mg Oral Daily  . citalopram  10 mg Oral Daily  . clotrimazole  1 Applicatorful Vaginal QHS  . dextromethorphan-guaiFENesin  1 tablet Oral BID  . divalproex  250 mg Oral Q12H  . ferrous sulfate  325 mg Oral Q breakfast  . gabapentin  200 mg Oral QHS  . heparin  5,000 Units Subcutaneous 3 times per day  . ipratropium-albuterol  3 mL Nebulization TID  . levothyroxine  125 mcg Oral  QAC breakfast  . lisinopril  20 mg Oral Daily  . methylPREDNISolone (SOLU-MEDROL) injection  60 mg Intravenous Daily  . risperiDONE  1.5 mg Oral QHS  . sertraline  25 mg Oral Daily  . sodium chloride  3 mL Intravenous Q12H   Continuous Infusions:   Principal Problem:   Acute on chronic respiratory failure with hypoxia (HCC) Active Problems:   Essential hypertension   Chronic anemia   Shortness of breath   COPD (chronic obstructive pulmonary disease) (HCC)   Bipolar affective disorder, manic (HCC)   Facial droop   Acute on chronic diastolic heart failure (HCC)   Hypothyroidism   S/P CABG x 3   CAD (coronary artery disease)   CHF exacerbation ([place])    Time spent: 35 minutes     Vornheder, Katie PA-S  [facility] 10/07/2015, 8:22 AM  LOS: 1 day    Addendum  I personally evaluated patient on 10/07/2015 and agree with above findings. Ms. Marden is a pleasant 64 year old female with a past medical history of chronic diastolic congestive heart failure, with previous echo performed on 06/24/2015 that showed grade 2 diastolic dysfunction with ejection fraction of 55-60%. Per medication reconciliation she had been on Lasix 40 mg by mouth daily. She presented to the emergency department on 10/06/2015 with complaints of worsening shortness of breath associated with weight gain, bilateral extremity edema, orthopnea. There was concerns over patient possibly having facial droop for which she was placed on CVA protocol. MRI of brain performed on 10/07/2015 was negative for acute infarct. Symptoms felt to be secondary to acute decompensated congestive heart failure likely related to dietary indiscretion (had a bag of potato chips at bedside). She was started on Lasix 60 mg IV BID having good urinary output. On my evaluation this morning she had improvement to by basilar crackles, breathing comfortably on supplemental oxygen 2L via nasal cannula. She reports feeling little better this  morning. Plan to continue IV diuresis, follow ins and outs. I spoke with CHF Navigator regarding close outpatient follow.

## 2015-10-07 NOTE — Clinical Social Work Note (Signed)
CSW Consult Acknowledged:   CSW received a consult for PCP. CSW will inform the case manager. CSW will sign off.   Clinical Social Worker  Kincade Granberg, MSW, LCSW [phone]

## 2015-10-07 NOTE — Progress Notes (Signed)
   10/07/15 1204  Clinical Encounter Type  Visited With Patient;Health care provider  Visit Type Initial  Spiritual Encounters  Spiritual Needs Literature   Chaplain responded to a request to do an advanced directive for the patient. Chaplain left paperwork with the patient, who indicated she will read and go through the document. Chaplain support is available as needed.   Jeri Lager, Chaplain 10/07/2015 12:05 PM

## 2015-10-07 NOTE — Progress Notes (Signed)
Pt did not have oxygen on and O2 sats were 85%.  Pt placed 4L nasal cannula back on.  RT will continue to monitor.

## 2015-10-07 NOTE — Progress Notes (Signed)
Heart Failure Navigator Consult Note  Presentation: Tonya Hogan is a 64 y.o. female with PMH of hypertension, thyroid cancer, hypothyroidism, CAD, s/p CABG, bipolar, obesity, COPD, chronic anemia, diastolic congestive heart failure with EF 55-60% and grade 2 diastolic dysfunction, depression, bipolar disorder, who presents with shortness of breath and left facial droop.  Patient reports that she has been having shortness of breath during the past 7 days, which has been progressively getting worse. She has cough with yellow-colored sputum production and wheezing. She has chest tightness, but no obvious chest pain. She does not have fever or chills. She also reports having left facial droop which started at 1 PM. She reports that she has chronic right extremity weakness which has not changed. No vision change or hearing loss. No numbness of in her extremities. Patient does not have abdominal pain, diarrhea, symptoms of a UTI. She has bilateral leg edema.  In ED, patient was found to have WBC 5.6, BNP 532.1, INR 1.13, PTT 27, temperature normal, oxygen saturation to 88% on room air, electrolytes okay, renal function okay. Chest x-ray showed interstitial pulmonary edema. CT head is negative for acute intracranial abnormalities. The patient is admitted to inpatient for further evaluation treatment.   Past Medical History  Diagnosis Date  . Depression   . Hypertension   . Thyroid disease   . Thyroid ca ([place])   . S/P CABG x 3   . Arthritis   . Obesity   . Bipolar 1 disorder ([place])   . CHF (congestive heart failure) ([place])   . Hypothyroidism   . CAD (coronary artery disease)     Social History   Social History  . Marital Status: Widowed    Spouse Name: N/A  . Number of Children: N/A  . Years of Education: N/A   Social History Main Topics  . Smoking status: Never Smoker   . Smokeless tobacco: Never Used  . Alcohol Use: No  . Drug Use: No  . Sexual Activity: No   Other Topics Concern   . None   Social History Narrative    ECHO:Study Conclusions--06/24/15  - Left ventricle: The cavity size was normal. Wall thickness was normal. Systolic function was normal. The estimated ejection fraction was in the range of 55% to 60%. Wall motion was normal; there were no regional wall motion abnormalities. Features are consistent with a pseudonormal left ventricular filling pattern, with concomitant abnormal relaxation and increased filling pressure (grade 2 diastolic dysfunction). - Left atrium: The atrium was mildly dilated.  Transthoracic echocardiography. M-mode, complete 2D, spectral Doppler, and color Doppler. Birthdate: Patient birthdate: Jan 10, 1951. Age: Patient is 64 yr old. Sex: Gender: female. BMI: 49.8 kg/m^2. Blood pressure:   109/59 Patient status: Inpatient  BNP    Component Value Date/Time   BNP 532.1* 10/06/2015 1800    ProBNP No results found for: PROBNP   Education Assessment and Provision:  Detailed education and instructions provided on heart failure disease management including the following:  Signs and symptoms of Heart Failure When to call the physician Importance of daily weights Low sodium diet Fluid restriction Medication management Anticipated future follow-up appointments  Patient education given on each of the above topics.  Patient acknowledges understanding and acceptance of all instructions.  I spoke briefly with Tonya Hogan regarding her HF.  She tells me that she was recently hospitalized for similar symptoms.  She also says that she recently "lost everything I owned" because she was "kicked out of" her apt.  She says that  she and her daughter had bed bugs in their home and were forced to get rid of all furniture secondary to the infestation.  She and her daughter have been living with her sister-in -Sports coach.  She does not have a scale and was unaware of the need to weigh daily.  I will provide a scale for home use  and I have reviewed the importance of daily weights and how they relate to signs and symptoms of HF.  I also reinforced the need for a low sodium diet and high sodium foods to avoid.  She denies any issues with getting or taking prescribed medications.  She currently has no car and no identifiable transportation to and from appts.  She does tell me that she and her daughter were to move into a "new apt" last night.  She will need referral and outpatient provider appt identified prior to discharge.   Education Materials:  "Living Better With Heart Failure" Booklet, Daily Weight Tracker Tool    High Risk Criteria for Readmission and/or Poor Patient Outcomes:  (Recommend Follow-up with Advanced [facility] Failure Clinic)--yes could benefit --however lacks reliable transportation   EF <30%-55-60% with grade 2 dias dys  2 or more admissions in 6 months- Yes  Difficult social situation- yes--recently relocated and no reliable transportation  Demonstrates medication noncompliance- denies    Barriers of Care:  [place], Knowledge and compliance  Discharge Planning:   Plans to return to home with her daughter

## 2015-10-07 NOTE — Evaluation (Signed)
Occupational Therapy Evaluation Patient Details Name: Tonya Hogan MRN: [id number] DOB: 1951-04-27 Today's Date: 10/07/2015    History of Present Illness Pt is a 64 y/o female with a PMH of HTN, thyroid cancer, hypothyroidism, CAD s/p CABG, COPD, CHF EF 55-60%. Pt presents with SOB and L facial droop. CT of head reveals no acute abnormalities, x-ray of chest revealed interstitial pulmonary edema    Clinical Impression   Pt admitted with above. She demonstrates the below listed deficits and will benefit from continued OT to maximize safety and independence with BADLs.  Pt presents to OT with generalized weakness.  She would benefit from AE for LB ADLs due to SOB with bending forward and DOE.   Will follow acutely.       Follow Up Recommendations  No OT follow up;Supervision - Intermittent    Equipment Recommendations  3 in 1 bedside comode;Other (comment) (Pt requresting hospital bed )    Recommendations for Other Services       Precautions / Restrictions Precautions Precautions: Fall Restrictions Weight Bearing Restrictions: No      Mobility Bed Mobility               General bed mobility comments: Pt sitting EOB when PT arrived.   Transfers Overall transfer level: Needs assistance Equipment used: Rolling walker (2 wheeled) Transfers: Sit to/from [facility] Sit to Stand: Supervision Stand pivot transfers: Supervision       General transfer comment: Supervision for safety. No imablances noted.     Balance Overall balance assessment: Needs assistance Sitting-balance support: Feet supported Sitting balance-Leahy Scale: Good     Standing balance support: During functional activity Standing balance-Leahy Scale: Fair                              ADL Overall ADL's : Needs assistance/impaired Eating/Feeding: Independent   Grooming: Wash/dry hands;Wash/dry face;Oral care;Brushing hair;Supervision/safety;Standing   Upper Body  Bathing: Set up;Sitting   Lower Body Bathing: Sit to/from stand;Supervison/ safety   Upper Body Dressing : Set up;Sitting   Lower Body Dressing: Min guard;Sit to/from stand   Toilet Transfer: Supervision/safety;Stand-pivot;BSC;RW   Toileting- Water quality scientist and Hygiene: Supervision/safety;Sit to/from stand       Functional mobility during ADLs: Supervision/safety;Rolling walker General ADL Comments: Pt fatigues quickly      Vision Vision Assessment?: No apparent visual deficits   Perception     Praxis      Pertinent Vitals/Pain Pain Assessment: No/denies pain     Hand Dominance Right   Extremity/Trunk Assessment Upper Extremity Assessment Upper Extremity Assessment: Generalized weakness   Lower Extremity Assessment Lower Extremity Assessment: Defer to PT evaluation   Cervical / Trunk Assessment Cervical / Trunk Assessment: Normal   Communication Communication Communication: No difficulties   Cognition Arousal/Alertness: Awake/alert Behavior During Therapy: WFL for tasks assessed/performed Overall Cognitive Status: Within Functional Limits for tasks assessed                     General Comments       Exercises       Shoulder Instructions      Home Living Family/patient expects to be discharged to:: Private residence Living Arrangements: Children;Other relatives Available Help at Discharge: Family;Available PRN/intermittently Type of Home: Apartment Home Access: Level entry     Home Layout: One level     Bathroom Shower/Tub: Teacher, early years/pre: Standard Bathroom Accessibility: Yes   Home Equipment:  None      Lives With: Daughter (and sister-in-law)    Prior Functioning/Environment Level of Independence: Independent        Comments: does not drive, and does not perform IADLs     OT Diagnosis: Generalized weakness   OT Problem List: Decreased strength;Decreased activity tolerance;Decreased knowledge of  use of DME or AE   OT Treatment/Interventions: Self-care/ADL training;DME and/or AE instruction;Therapeutic activities;Energy conservation;Patient/family education    OT Goals(Current goals can be found in the care plan section) Acute Rehab OT Goals Patient Stated Goal: Home with family OT Goal Formulation: With patient Time For Goal Achievement: 10/14/15 Potential to Achieve Goals: Good ADL Goals Pt Will Perform Grooming: with modified independence;standing Pt Will Perform Upper Body Bathing: with modified independence;sitting Pt Will Perform Lower Body Bathing: with modified independence;sit to/from stand Pt Will Perform Upper Body Dressing: with modified independence;sitting Pt Will Perform Lower Body Dressing: with modified independence;sit to/from stand Pt Will Transfer to Toilet: with modified independence;ambulating;regular height toilet;bedside commode;grab bars Pt Will Perform Toileting - Clothing Manipulation and hygiene: with modified independence;sit to/from stand Pt Will Perform Tub/Shower Transfer: Tub transfer;with supervision;3 in 1;rolling walker;ambulating  OT Frequency: Min 2X/week   Barriers to D/C:            Co-evaluation              End of Session Equipment Utilized During Treatment: Rolling walker;Oxygen Nurse Communication: Mobility status  Activity Tolerance: Patient tolerated treatment well Patient left: in bed;with call bell/phone within reach;with bed alarm set   Time: [id number] OT Time Calculation (min): 26 min Charges:  OT General Charges $OT Visit: 1 Procedure OT Evaluation $Initial OT Evaluation Tier I: 1 Procedure OT Treatments $Self Care/Home Management : 8-22 mins G-Codes:    Costantino Kohlbeck M 24-Oct-2015, 4:55 PM

## 2015-10-07 NOTE — Evaluation (Signed)
Speech Language Pathology Evaluation Patient Details Name: Tonya Hogan MRN: [id number] DOB: 01/29/1951 Today's Date: 10/07/2015 Time: [id number] SLP Time Calculation (min) (ACUTE ONLY): 26 min  Problem List:  Patient Active Problem List   Diagnosis Date Noted  . Facial droop 10/06/2015  . Acute on chronic diastolic heart failure ([place]) 10/06/2015  . Acute on chronic respiratory failure with hypoxia ([place]) 10/06/2015  . CHF exacerbation ([place]) 10/06/2015  . Hypothyroidism   . S/P CABG x 3   . CAD (coronary artery disease)   . Bipolar affective disorder, manic ([place]) 07/08/2015  . COPD (chronic obstructive pulmonary disease) ([place]) 06/26/2015  . Acute pulmonary edema ([place]) 06/24/2015  . Essential hypertension 06/23/2015  . Chronic anemia 06/23/2015  . Post-operative hypothyroidism 06/23/2015  . Shortness of breath 06/23/2015  . Pulmonary edema 06/22/2015   Past Medical History:  Past Medical History  Diagnosis Date  . Depression   . Hypertension   . Thyroid disease   . Thyroid ca ([place])   . S/P CABG x 3   . Arthritis   . Obesity   . Bipolar 1 disorder ([place])   . CHF (congestive heart failure) ([place])   . Hypothyroidism   . CAD (coronary artery disease)    Past Surgical History:  Past Surgical History  Procedure Laterality Date  . Coronary artery bypass graft    . Tonsillectomy    . Abdominal hysterectomy     HPI:  64 y.o. female with PMH of hypertension, thyroid cancer, hypothyroidism, CAD, s/p CABG, bipolar, obesity, COPD, chronic anemia, diastolic congestive heart failure, depression, bipolar disorder, who presents with shortness of breath and left facial droop.  Dx acute on chronic diastolic congestive heart failure; stroke w/u ongoing.   Assessment / Plan / Recommendation Clinical Impression  Pt presents with normal speech, fluency, language, and cognition; no focal CN deficits noted.  No SLP f/u recommended.     SLP Assessment  Patient does not need any further  Speech [facility] Pathology Services    Follow Up Recommendations  None          SLP Evaluation Prior Functioning  Cognitive/Linguistic Baseline: Within functional limits Type of Home: Apartment  Lives With: Daughter Vocation: Retired   Associate Professor  Overall Cognitive Status: Within Functional Limits for tasks assessed Arousal/Alertness: Awake/alert Orientation Level: Oriented X4 Attention: Selective Selective Attention: Appears intact Awareness: Appears intact Problem Solving: Appears intact    Comprehension  Auditory Comprehension Overall Auditory Comprehension: Appears within functional limits for tasks assessed Visual Recognition/Discrimination Discrimination: Within Function Limits    Expression Expression Primary Mode of Expression: Verbal Verbal Expression Overall Verbal Expression: Appears within functional limits for tasks assessed Written Expression Dominant Hand: Right Written Expression: Not tested   Oral / Motor Oral Motor/Sensory Function Overall Oral Motor/Sensory Function: Within functional limits    Juan Quam Laurice 10/07/2015, 10:17 AM

## 2015-10-07 NOTE — Progress Notes (Signed)
Utilization review completed. Cameron Katayama, RN, BSN. 

## 2015-10-07 NOTE — Progress Notes (Signed)
  Echocardiogram 2D Echocardiogram has been performed.  Tonya Hogan 10/07/2015, 11:30 AM

## 2015-10-08 DIAGNOSIS — R0602 Shortness of breath: Secondary | ICD-10-CM

## 2015-10-08 DIAGNOSIS — F3110 Bipolar disorder, current episode manic without psychotic features, unspecified: Secondary | ICD-10-CM

## 2015-10-08 DIAGNOSIS — J9621 Acute and chronic respiratory failure with hypoxia: Secondary | ICD-10-CM

## 2015-10-08 DIAGNOSIS — J441 Chronic obstructive pulmonary disease with (acute) exacerbation: Secondary | ICD-10-CM

## 2015-10-08 LAB — HEMOGLOBIN A1C
HEMOGLOBIN A1C: 5.4 % (ref 4.8–5.6)
MEAN PLASMA GLUCOSE: 108 mg/dL

## 2015-10-08 LAB — BASIC METABOLIC PANEL
Anion gap: 5 (ref 5–15)
BUN: 15 mg/dL (ref 6–20)
CHLORIDE: 93 mmol/L — AB (ref 101–111)
CO2: 44 mmol/L — ABNORMAL HIGH (ref 22–32)
CREATININE: 0.79 mg/dL (ref 0.44–1.00)
Calcium: 8.9 mg/dL (ref 8.9–10.3)
GFR calc Af Amer: >60 mL/min (ref >60)
GLUCOSE: 84 mg/dL (ref 65–99)
POTASSIUM: 3.8 mmol/L (ref 3.5–5.1)
SODIUM: 142 mmol/L (ref 135–145)

## 2015-10-08 LAB — CBC
HEMATOCRIT: 34.5 % — AB (ref 36.0–46.0)
Hemoglobin: 9.9 g/dL — ABNORMAL LOW (ref 12.0–15.0)
MCH: 24.9 pg — ABNORMAL LOW (ref 26.0–34.0)
MCHC: 28.7 g/dL — AB (ref 30.0–36.0)
MCV: 86.7 fL (ref 78.0–100.0)
PLATELETS: 219 10*3/uL (ref 150–400)
RBC: 3.98 MIL/uL (ref 3.87–5.11)
RDW: 18.8 % — AB (ref 11.5–15.5)
WBC: 6.6 10*3/uL (ref 4.0–10.5)

## 2015-10-08 MED ORDER — PREDNISONE 20 MG PO TABS
40.0000 mg | ORAL_TABLET | Freq: Every day | ORAL | Status: DC
  Administered 2015-10-09: 40 mg via ORAL
  Filled 2015-10-08: qty 2

## 2015-10-08 NOTE — Progress Notes (Signed)
Nutrition Education Note  RD consulted for nutrition education regarding CHF.  "Heart Failure Nutrition Therapy" handout from the Academy of [employer] and Dietetics (AND) already provided by Heart Failure Coordinator. Pt states that she knows that she needs to stop using salt, eliminate canned foods, and switch to [place] hot dogs. Reviewed patient's dietary recall. Provided examples on ways to decrease sodium intake in diet. Discouraged intake of processed foods and use of salt shaker. Encouraged fresh fruits and vegetables as well as whole grain sources of carbohydrates to maximize fiber intake. Emphasized the importance of checking nutrition labels for sodium content per serving. Reviewed list of low sodium foods by each food group as well as high sodium foods to avoid by each food group.   RD discussed why it is important for patient to adhere to diet recommendations, and emphasized the role of fluids, foods to avoid, and importance of weighing self daily. Provided "Sodium-free flavoring tips" handout (from AND) and low sodium recipes ideas (from [place] website heart.org). Pt very appreciative. Teach back method used.  Expect good compliance.  Body mass index is 42.97 kg/(m^2). Pt meets criteria for Morbid Obesity based on current BMI.  Current diet order is Heart Healthy, patient is consuming approximately 100% of meals at this time. Labs and medications reviewed. No further nutrition interventions warranted at this time. RD contact information provided. If additional nutrition issues arise, please re-consult RD.   Scarlette Ar RD, LDN Inpatient Clinical Dietitian Pager: [phone] After Hours Pager: [phone]

## 2015-10-08 NOTE — Progress Notes (Signed)
[facility] PROGRESS NOTE  Tonya Hogan [id number] DOB: 1951/11/22 DOA: 10/06/2015 PCP: No PCP Per Patient  Tonya Hogan is a pleasant 64 year old female with a past medical history of chronic diastolic congestive heart failure, with previous echo performed on 06/24/2015 that showed grade 2 diastolic dysfunction with ejection fraction of 55-60%. Per medication reconciliation she had been on Lasix 40 mg by mouth daily. She presented to the emergency department on 10/06/2015 with complaints of worsening shortness of breath associated with weight gain, bilateral extremity edema, orthopnea. There was concerns over patient possibly having facial droop for which she was placed on CVA protocol. MRI of brain performed on 10/07/2015 was negative for acute infarct. Symptoms felt to be secondary to acute decompensated congestive heart failure likely related to dietary indiscretion (had a bag of potato chips at bedside). She was started on Lasix 60 mg IV BID having good urinary output.    Assessment/Plan:  Acute on Chronic Diastolic CHF -Patient presented with SOB, cough and worsening bilateral lower extremity edema  -BNP in ED elevated at 532, CXR with bilateral interstitial pulmonary edema  -Echo performed 06/24/15 with EF 55-60% and grade 2 diastolic dysfunction  -Repeat echo pending at time of this note  -Appears to be approximately 15 pounds above her dry weight--- up from 249 on discharge in 06/2015 -This is the patient's second admission in 6 months for CHF exacerbation. Multiple social stressors and concerns for compliance with treatment as an outpatient. Social work is Financial risk analyst to determine home health options. Discussed case with heart failure navigator who is working with the patient to plan outpatient follow-up. -suspect patient is not compliant with diet due to monetary issues-- was living with family eating hotdogs and chips -Continue Lasix 60 mg IV BID, Coreg, ASA and Lisinopril   -Monitor strict I/Os, daily weights, low salt diet   COPD Exacerbation  -Presented with complaints of SOB and cough with productive yellow sputum  -2L O2 at home -Continue scheduled Duoneb and PRN Albuterol, Solumedrol 60 mg IV daily  -Sputum culture pending, treating with oral azithromycin for now  -Mucinex for cough  Facial Droop -Patient had no focal neurological deficits upon admission -MRI negative for acute infarct  -Neurology recommending no further workup  Hypokalemia -replete as needed  Hypertension  -BPs stable since admission -Continue Lasix, Lisinopril  Hyperlipidemia  -LDL 10/07/15 - 151  -Start Lipitor 40 mg   Prolonged QTC -EKG 11/15 with QTc of 476 -Patient on multiple psych meds, discontinued Zofran, continue to monitor   CAD S/P CABG -Denies chest pain at this time  -Cyclic troponins are negative, EKG with no acute ischemic changes   Chronic Anemia  -Hemoglobin appears to be at the patient's baseline, 10.0 today   Bipolar Disorder, mixed  -Stable, no suicidal or homicidal ideations  -Continue Celexa, Depakote, Risperidone, Zoloft    Code Status: Full Code  Family Communication: No family present at bedside Disposition Plan: Patient admitted for CHF exacerbation, will continue to diurese with IV lasix-- home in AM??   Consultants:  None   Procedures:  None  Antibiotics:  None   HPI/Subjective: breathing better No fever, no chills Plans to live with her daughter upon d/c   Objective: Filed Vitals:   10/08/15 0922  BP: 120/58  Pulse: 77  Temp: 98.2 F (36.8 C)  Resp: 20    Intake/Output Summary (Last 24 hours) at 10/08/15 1108 Last data filed at 10/07/15 1853  Gross per 24 hour  Intake    240  ml  Output    250 ml  Net    -10 ml   Filed Weights   10/07/15 0500 10/07/15 1015 10/08/15 0500  Weight: 122.743 kg (270 lb 9.6 oz) 119.886 kg (264 lb 4.8 oz) 117.119 kg (258 lb 3.2 oz)    Exam:   General:  Pleasant female,  obese, sitting upright in bed, NAD   Cardiovascular: RRR, no murmurs or gallops  Respiratory: Faint bilateral expiratory wheezes, good air movement   Abdomen: Abdomen is soft, non-tender, BS present all 4 quadrants   Musculoskeletal: Non-focal.   Data Reviewed: Basic Metabolic Panel:  Recent Labs Lab 10/06/15 1800 10/07/15 0010 10/07/15 0602 10/08/15 0603  NA 144  --  142 142  K 3.8  --  3.4* 3.8  CL 99*  --  93* 93*  CO2 38*  --  40* 44*  GLUCOSE 92  --  86 84  BUN 21*  --  14 15  CREATININE 0.83 0.91 0.78 0.79  CALCIUM 8.8*  --  8.7* 8.9  MG  --   --  1.8  --    Liver Function Tests:  Recent Labs Lab 10/06/15 1800  AST 13*  ALT 17  ALKPHOS 63  BILITOT 0.6  PROT 7.8  ALBUMIN 3.4*   No results for input(s): LIPASE, AMYLASE in the last 168 hours. No results for input(s): AMMONIA in the last 168 hours. CBC:  Recent Labs Lab 10/06/15 1800 10/07/15 0010 10/08/15 0603  WBC 5.6 6.0 6.6  NEUTROABS 3.4  --   --   HGB 9.7* 10.0* 9.9*  HCT 34.2* 34.3* 34.5*  MCV 86.4 86.0 86.7  PLT 243 217 219   Cardiac Enzymes:  Recent Labs Lab 10/06/15 1800 10/07/15 0010 10/07/15 0602 10/07/15 1225  TROPONINI <0.03 <0.03 <0.03 <0.03   BNP (last 3 results)  Recent Labs  06/22/15 1743 09/06/15 1440 10/06/15 1800  BNP 147.9* 342.5* 532.1*    ProBNP (last 3 results) No results for input(s): PROBNP in the last 8760 hours.  CBG:  Recent Labs Lab 10/06/15 1826  GLUCAP 75    No results found for this or any previous visit (from the past 240 hour(s)).   Studies: Dg Chest 2 View  10/06/2015  CLINICAL DATA:  Shortness of breath with weakness and central chest pain, 1 month duration. EXAM: CHEST  2 VIEW COMPARISON:  09/06/2015 FINDINGS: Previous median sternotomy. The heart is enlarged. There is venous hypertension with interstitial edema. No visible effusion. No lobar collapse. No acute bone finding. IMPRESSION: Cardiomegaly and interstitial pulmonary edema.  Electronically Signed   By: Nelson Chimes M.D.   On: 10/06/2015 19:22   Ct Head Wo Contrast  10/06/2015  CLINICAL DATA:  Pt co sob x months, pt co left side facial droop around 2 o'clock p.m. today EXAM: CT HEAD WITHOUT CONTRAST TECHNIQUE: Contiguous axial images were obtained from the base of the skull through the vertex without intravenous contrast. COMPARISON:  None. FINDINGS: There is no intra or extra-axial fluid collection or mass lesion. The basilar cisterns and ventricles have a normal appearance. There is no CT evidence for acute infarction or hemorrhage. Bone windows are unremarkable. IMPRESSION: No evidence for acute  abnormality. Electronically Signed   By: Nolon Nations M.D.   On: 10/06/2015 19:29   Mr Brain Wo Contrast  10/07/2015  CLINICAL DATA:  Left facial droop EXAM: MRI HEAD WITHOUT CONTRAST TECHNIQUE: Multiplanar, multiecho pulse sequences of the brain and surrounding structures were obtained without intravenous contrast.  COMPARISON:  CT head 10/06/2015 FINDINGS: Negative for acute infarct. 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 Encephalomalacia left anterior temporal lobe consistent with infarction or prior trauma. Small chronic infarct right cerebellum. Scattered areas of chronic micro hemorrhage in the brain. No hematoma or fluid collection. Ventricle size normal. Cerebral volume normal. Pituitary normal in size. Negative for mass or edema.  No shift of the midline structures Paranasal sinuses clear. IMPRESSION: Negative for acute infarct. Chronic ischemic changes in the left temporal lobe, left parietal lobe, and right cerebellum. Scattered areas of chronic micro hemorrhage, likely related to poorly controlled hypertension. Electronically Signed   By: Franchot Gallo M.D.   On: 10/07/2015 07:34    Scheduled Meds: . aspirin  300 mg Rectal Daily   Or  . aspirin  325 mg Oral Daily  . atorvastatin  40 mg Oral q1800  . azithromycin  250 mg Oral Daily  . clotrimazole  1  Applicatorful Vaginal QHS  . dextromethorphan-guaiFENesin  1 tablet Oral BID  . divalproex  250 mg Oral Q12H  . docusate sodium  200 mg Oral BID  . ferrous sulfate  325 mg Oral Q breakfast  . furosemide  60 mg Intravenous BID  . gabapentin  200 mg Oral QHS  . heparin  5,000 Units Subcutaneous 3 times per day  . ipratropium-albuterol  3 mL Nebulization TID  . levothyroxine  125 mcg Oral QAC breakfast  . lisinopril  20 mg Oral Daily  . methylPREDNISolone (SOLU-MEDROL) injection  60 mg Intravenous Daily  . potassium chloride  20 mEq Oral BID  . risperiDONE  1.5 mg Oral QHS  . sertraline  25 mg Oral Daily  . sodium chloride  3 mL Intravenous Q12H   Continuous Infusions:   Principal Problem:   Acute on chronic respiratory failure with hypoxia ([facility]) Active Problems:   Essential hypertension   Chronic anemia   Shortness of breath   COPD (chronic obstructive pulmonary disease) ([facility])   Bipolar affective disorder, manic ([facility])   Facial droop   Acute on chronic diastolic heart failure ([facility])   Hypothyroidism   S/P CABG x 3   CAD (coronary artery disease)   CHF exacerbation ([place])    Time spent: 25 minutes     Trooper Olander DO  [facility] 10/08/2015, 11:08 AM  LOS: 2 days

## 2015-10-08 NOTE — Progress Notes (Signed)
I returned to provide a scale for Tonya Hogan.  She was very thankful.  She told me that she has plans to follow through and pursue [facility] as an option for her transportation needs.  She was able to teach back when to call the physician related to her weight gains and HF symptoms.

## 2015-10-08 NOTE — Progress Notes (Signed)
Occupational Therapy Treatment Patient Details Name: Tonya Hogan MRN: [id number] DOB: Nov 09, 1951 Today's Date: 10/08/2015    History of present illness Pt is a 64 y/o female with a PMH of HTN, thyroid cancer, hypothyroidism, CAD s/p CABG, COPD, CHF EF 55-60%. Pt presents with SOB and L facial droop. CT of head reveals no acute abnormalities, x-ray of chest revealed interstitial pulmonary edema    OT comments  Pt provided with AE via indigent supply and was able to perform LB ADLs with supervision and DOE 2/4.   Follow Up Recommendations  No OT follow up    Equipment Recommendations  3 in 1 bedside comode;Other (comment)    Recommendations for Other Services      Precautions / Restrictions Precautions Precautions: Fall       Mobility Bed Mobility Overal bed mobility: Modified Independent                Transfers     Transfers: Sit to/from Stand Sit to Stand: Supervision              Balance                                   ADL               Lower Body Bathing: Supervison/ safety;With adaptive equipment;Sit to/from stand       Lower Body Dressing: Supervision/safety;With adaptive equipment;Sit to/from stand                 General ADL Comments: Pt provided with AE from indigent supply.  She was instructed in use and able to return demonstration.         Vision                     Perception     Praxis      Cognition   Behavior During Therapy: WFL for tasks assessed/performed Overall Cognitive Status: Within Functional Limits for tasks assessed                       Extremity/Trunk Assessment               Exercises     Shoulder Instructions       General Comments      Pertinent Vitals/ Pain       Pain Assessment: No/denies pain  Home Living                                          Prior Functioning/Environment              Frequency Min 2X/week      Progress Toward Goals  OT Goals(current goals can now be found in the care plan section)     ADL Goals Pt Will Perform Grooming: with modified independence;standing Pt Will Perform Upper Body Bathing: with modified independence;sitting Pt Will Perform Lower Body Bathing: with modified independence;sit to/from stand Pt Will Perform Upper Body Dressing: with modified independence;sitting Pt Will Perform Lower Body Dressing: with modified independence;sit to/from stand Pt Will Transfer to Toilet: with modified independence;ambulating;regular height toilet;bedside commode;grab bars Pt Will Perform Toileting - Clothing Manipulation and hygiene: with modified independence;sit to/from stand Pt Will Perform Tub/Shower Transfer: Tub transfer;with supervision;3 in 1;rolling walker;ambulating  Plan  Discharge plan remains appropriate    Co-evaluation                 End of Session Equipment Utilized During Treatment: Oxygen   Activity Tolerance Patient tolerated treatment well   Patient Left in bed;with call bell/phone within reach   Nurse Communication Mobility status        Time: [id number] OT Time Calculation (min): 22 min  Charges: OT General Charges $OT Visit: 1 Procedure OT Treatments $Self Care/Home Management : 8-22 mins  Tonya Hogan M 10/08/2015, 5:13 PM

## 2015-10-09 DIAGNOSIS — I10 Essential (primary) hypertension: Secondary | ICD-10-CM

## 2015-10-09 LAB — CBC
HCT: 36.6 % (ref 36.0–46.0)
Hemoglobin: 10.4 g/dL — ABNORMAL LOW (ref 12.0–15.0)
MCH: 24.4 pg — AB (ref 26.0–34.0)
MCHC: 28.4 g/dL — AB (ref 30.0–36.0)
MCV: 85.7 fL (ref 78.0–100.0)
PLATELETS: 239 10*3/uL (ref 150–400)
RBC: 4.27 MIL/uL (ref 3.87–5.11)
RDW: 18.8 % — ABNORMAL HIGH (ref 11.5–15.5)
WBC: 7.1 10*3/uL (ref 4.0–10.5)

## 2015-10-09 LAB — BASIC METABOLIC PANEL
Anion gap: 6 (ref 5–15)
BUN: 18 mg/dL (ref 6–20)
CO2: 42 mmol/L — ABNORMAL HIGH (ref 22–32)
CREATININE: 0.77 mg/dL (ref 0.44–1.00)
Calcium: 9 mg/dL (ref 8.9–10.3)
Chloride: 92 mmol/L — ABNORMAL LOW (ref 101–111)
GFR calc Af Amer: >60 mL/min (ref >60)
GLUCOSE: 83 mg/dL (ref 65–99)
Potassium: 4 mmol/L (ref 3.5–5.1)
SODIUM: 140 mmol/L (ref 135–145)

## 2015-10-09 MED ORDER — ATORVASTATIN CALCIUM 40 MG PO TABS: 40.0000 mg | ORAL_TABLET | Freq: Every day | ORAL | Status: DC

## 2015-10-09 MED ORDER — ASPIRIN 325 MG PO TABS: 325.0000 mg | ORAL_TABLET | Freq: Every day | ORAL | Status: DC

## 2015-10-09 MED ORDER — PREDNISONE 10 MG PO TABS: ORAL_TABLET | ORAL | Status: DC

## 2015-10-09 MED ORDER — POTASSIUM CHLORIDE CRYS ER 20 MEQ PO TBCR: 20.0000 meq | EXTENDED_RELEASE_TABLET | Freq: Every day | ORAL | Status: DC

## 2015-10-09 MED ORDER — AZITHROMYCIN 250 MG PO TABS: ORAL_TABLET | ORAL | Status: DC

## 2015-10-09 MED ORDER — FUROSEMIDE 40 MG PO TABS: 40.0000 mg | ORAL_TABLET | Freq: Two times a day (BID) | ORAL | Status: DC

## 2015-10-09 MED ORDER — DM-GUAIFENESIN ER 30-600 MG PO TB12: 1.0000 | ORAL_TABLET | Freq: Two times a day (BID) | ORAL | Status: DC

## 2015-10-09 MED ORDER — FUROSEMIDE 40 MG PO TABS
40.0000 mg | ORAL_TABLET | Freq: Two times a day (BID) | ORAL | Status: DC
Start: 2015-10-09 — End: 2015-10-09
  Administered 2015-10-09: 40 mg via ORAL
  Filled 2015-10-09: qty 1

## 2015-10-09 NOTE — Discharge Summary (Signed)
Physician Discharge Summary  Tonya Hogan [id number] DOB: [birth year] DOA: 10/06/2015  PCP: No PCP Per Patient  Admit date: 10/06/2015 Discharge date: 10/09/2015  Time spent: 35 minutes  Recommendations for Outpatient Follow-up:  1. Continue home O2 2. Home health RN 3. Daily weights 4. Needs complaince   Discharge Diagnoses:  Principal Problem:   Acute on chronic respiratory failure with hypoxia (HCC) Active Problems:   Essential hypertension   Chronic anemia   Shortness of breath   COPD (chronic obstructive pulmonary disease) (HCC)   Bipolar affective disorder, manic (HCC)   Facial droop   Acute on chronic diastolic heart failure (HCC)   Hypothyroidism   S/P CABG x 3   CAD (coronary artery disease)   CHF exacerbation (HCC)   Discharge Condition: improved  Diet recommendation: cardiac  Filed Weights   10/07/15 1015 10/08/15 0500 10/09/15 0500  Weight: 119.886 kg (264 lb 4.8 oz) 117.119 kg (258 lb 3.2 oz) 116.665 kg (257 lb 3.2 oz)    History of present illness:  Tonya Hogan is a 64 y.o. female with PMH of hypertension, thyroid cancer, hypothyroidism, CAD, s/p CABG, bipolar, obesity, COPD, chronic anemia, diastolic congestive heart failure with EF 55-60% and grade 2 diastolic dysfunction, depression, bipolar disorder, who presents with shortness of breath and left facial droop.  Patient reports that she has been having shortness of breath during the past 7 days, which has been progressively getting worse. She has cough with yellow-colored sputum production and wheezing. She has chest tightness, but no obvious chest pain. She does not have fever or chills. She also reports having left facial droop which started at 1 PM. She reports that she has chronic right extremity weakness which has not changed. No vision change or hearing loss. No numbness of in her extremities. Patient does not have abdominal pain, diarrhea, symptoms of a UTI. She has bilateral leg edema.  In  ED, patient was found to have WBC 5.6, BNP 532.1, INR 1.13, PTT 27, temperature normal, oxygen saturation to 88% on room air, electrolytes okay, renal function okay. Chest x-ray showed interstitial pulmonary edema. CT head is negative for acute intracranial abnormalities. The patient is admitted to inpatient for further evaluation treatment.  Hospital Course:  Acute on Chronic Diastolic CHF -Patient presented with SOB, cough and worsening bilateral lower extremity edema  -BNP in ED elevated at 532, CXR with bilateral interstitial pulmonary edema  -Echo performed 06/24/15 with EF 55-60% and grade 2 diastolic dysfunction  -Repeat echo shows perserved systolic CHF -Appears to be approximately 15 pounds above her dry weight--- up from 249 on discharge in 06/2015 -This is the patient's second admission in 6 months for CHF exacerbation. Multiple social stressors and concerns for compliance with treatment as an outpatient. Social work is Financial risk analyst to determine home health options. Discussed case with heart failure navigator who is working with the patient to plan outpatient follow-up- home health RN -suspect patient is not compliant with diet due to monetary issues-- was living with family eating hotdogs and chips -lasix 40 mg PO BID , Coreg, ASA and Lisinopril  -Monitor strict I/Os, daily weights, low salt diet  -compliance is an issue  COPD Exacerbation  -Presented with complaints of SOB and cough with productive yellow sputum  -2L O2 at home -Continue scheduled Duoneb and PRN Albuterol, wean steroids -Sputum culture pending, treating with oral azithromycin for now  -Mucinex for cough  Facial Droop -Patient had no focal neurological deficits upon admission -MRI negative for acute infarct  -  Neurology recommending no further workup  Hypokalemia -replete as needed  Hypertension  -BPs stable since admission -Continue Lasix, Lisinopril  Hyperlipidemia  -LDL 10/07/15 - 151  -Start  Lipitor 40 mg   Prolonged QTC -EKG 11/15 with QTc of 476 -Patient on multiple psych meds, discontinued Zofran, continue to monitor   CAD S/P CABG -Denies chest pain at this time  -Cyclic troponins are negative, EKG with no acute ischemic changes   Chronic Anemia  -Hemoglobin appears to be at the patient's baseline, 10.0 today   Bipolar Disorder, mixed  -Stable, no suicidal or homicidal ideations  -Continue Celexa, Depakote, Risperidone, Zoloft   Procedures:    Consultations:    Discharge Exam: Filed Vitals:   10/09/15 0941  BP: 126/67  Pulse: 87  Temp:   Resp: 20    General: pleasant/cooperative Cardiovascular: rrr Respiratory: clear, no wheezing  Discharge Instructions   Discharge Instructions    (HEART FAILURE PATIENTS) Call MD:  Anytime you have any of the following symptoms: 1) 3 pound weight gain in 24 hours or 5 pounds in 1 week 2) shortness of breath, with or without a dry hacking cough 3) swelling in the hands, feet or stomach 4) if you have to sleep on extra pillows at night in order to breathe.    Complete by:  As directed      Diet - low sodium heart healthy    Complete by:  As directed      Discharge instructions    Complete by:  As directed   Continue home O2- 2L continuous BMP 1 weeks re Cr     Increase activity slowly    Complete by:  As directed           Current Discharge Medication List    START taking these medications   Details  aspirin 325 MG tablet Take 1 tablet (325 mg total) by mouth daily. Qty: 30 tablet, Refills: 0    atorvastatin (LIPITOR) 40 MG tablet Take 1 tablet (40 mg total) by mouth daily at 6 PM. Qty: 30 tablet, Refills: 0    azithromycin (ZITHROMAX) 250 MG tablet PO daily x 4 days Qty: 4 each, Refills: 0    dextromethorphan-guaiFENesin (MUCINEX DM) 30-600 MG 12hr tablet Take 1 tablet by mouth 2 (two) times daily. Qty: 10 tablet, Refills: 0    potassium chloride SA (K-DUR,KLOR-CON) 20 MEQ tablet Take 1 tablet  (20 mEq total) by mouth daily. Qty: 30 tablet, Refills: 0      CONTINUE these medications which have CHANGED   Details  furosemide (LASIX) 40 MG tablet Take 1 tablet (40 mg total) by mouth 2 (two) times daily. Qty: 60 tablet, Refills: 0    predniSONE (DELTASONE) 10 MG tablet 40 mg PO x 2 days, 30 mg x 2 days, 20 mg x 2 days, 10 mg x 2 days, 5 mg x 2 days then d/c Qty: 21 tablet, Refills: 0      CONTINUE these medications which have NOT CHANGED   Details  acetaminophen (RA ACETAMINOPHEN) 650 MG CR tablet Take 650 mg by mouth every 8 (eight) hours as needed. pain    albuterol (PROVENTIL HFA;VENTOLIN HFA) 108 (90 BASE) MCG/ACT inhaler Inhale 2 puffs into the lungs every 6 (six) hours as needed for wheezing or shortness of breath. Qty: 1 Inhaler, Refills: 0    divalproex (DEPAKOTE SPRINKLE) 125 MG capsule Take 250 mg by mouth 2 (two) times daily.    ferrous sulfate 325 (65 FE) MG  tablet Take 325 mg by mouth daily with breakfast.    gabapentin (NEURONTIN) 100 MG capsule Take 200 mg by mouth at bedtime.    hydrOXYzine (ATARAX/VISTARIL) 25 MG tablet Take 25 mg by mouth 3 (three) times daily as needed for anxiety or itching.     levothyroxine (SYNTHROID, LEVOTHROID) 125 MCG tablet Take 125 mcg by mouth daily before breakfast.    lisinopril (PRINIVIL,ZESTRIL) 20 MG tablet Take 20 mg by mouth daily.    miconazole (MICONAZOLE 7) 2 % vaginal cream Place 1 Applicatorful vaginally at bedtime.    naproxen sodium (ANAPROX) 220 MG tablet Take 440 mg by mouth 2 (two) times daily as needed (pain).    oxyCODONE (OXY IR/ROXICODONE) 5 MG immediate release tablet Take 1 tablet (5 mg total) by mouth every 4 (four) hours as needed for severe pain. Qty: 10 tablet, Refills: 0    risperiDONE (RISPERDAL) 0.5 MG tablet Take 1.5 mg by mouth at bedtime.    sertraline (ZOLOFT) 25 MG tablet Take 25 mg by mouth daily.    zolpidem (AMBIEN) 5 MG tablet Take 5 mg by mouth at bedtime as needed for sleep.       STOP taking these medications     LORazepam (ATIVAN) 1 MG tablet      metoprolol tartrate (LOPRESSOR) 25 MG tablet      Divalproex Sodium (DEPAKOTE PO)        Allergies  Allergen Reactions  . Chocolate     Sneezing, coughing  . Peanut-Containing Drug Products     Sneezing,coughing  . Vicodin  [Hydrocodone-Acetaminophen] Rash   Follow-up Information    Please follow up.   Why:  PCP 1 week for BMP       The results of significant diagnostics from this hospitalization (including imaging, microbiology, ancillary and laboratory) are listed below for reference.    Significant Diagnostic Studies: Dg Chest 2 View  10/06/2015  CLINICAL DATA:  Shortness of breath with weakness and central chest pain, 1 month duration. EXAM: CHEST  2 VIEW COMPARISON:  09/06/2015 FINDINGS: Previous median sternotomy. The heart is enlarged. There is venous hypertension with interstitial edema. No visible effusion. No lobar collapse. No acute bone finding. IMPRESSION: Cardiomegaly and interstitial pulmonary edema. Electronically Signed   By: Nelson Chimes M.D.   On: 10/06/2015 19:22   Ct Head Wo Contrast  10/06/2015  CLINICAL DATA:  Pt co sob x months, pt co left side facial droop around 2 o'clock p.m. today EXAM: CT HEAD WITHOUT CONTRAST TECHNIQUE: Contiguous axial images were obtained from the base of the skull through the vertex without intravenous contrast. COMPARISON:  None. FINDINGS: There is no intra or extra-axial fluid collection or mass lesion. The basilar cisterns and ventricles have a normal appearance. There is no CT evidence for acute infarction or hemorrhage. Bone windows are unremarkable. IMPRESSION: No evidence for acute  abnormality. Electronically Signed   By: Nolon Nations M.D.   On: 10/06/2015 19:29   Mr Brain Wo Contrast  10/07/2015  CLINICAL DATA:  Left facial droop EXAM: MRI HEAD WITHOUT CONTRAST TECHNIQUE: Multiplanar, multiecho pulse sequences of the brain and surrounding  structures were obtained without intravenous contrast. COMPARISON:  CT head 10/06/2015 FINDINGS: Negative for acute infarct. 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 Encephalomalacia left anterior temporal lobe consistent with infarction or prior trauma. Small chronic infarct right cerebellum. Scattered areas of chronic micro hemorrhage in the brain. No hematoma or fluid collection. Ventricle size normal. Cerebral volume normal. Pituitary normal  in size. Negative for mass or edema.  No shift of the midline structures Paranasal sinuses clear. IMPRESSION: Negative for acute infarct. Chronic ischemic changes in the left temporal lobe, left parietal lobe, and right cerebellum. Scattered areas of chronic micro hemorrhage, likely related to poorly controlled hypertension. Electronically Signed   By: Franchot Gallo M.D.   On: 10/07/2015 07:34    Microbiology: No results found for this or any previous visit (from the past 240 hour(s)).   Labs: Basic Metabolic Panel:  Recent Labs Lab 10/06/15 1800 10/07/15 0010 10/07/15 0602 10/08/15 0603 10/09/15 0558  NA 144  --  142 142 140  K 3.8  --  3.4* 3.8 4.0  CL 99*  --  93* 93* 92*  CO2 38*  --  40* 44* 42*  GLUCOSE 92  --  86 84 83  BUN 21*  --  14 15 18   CREATININE 0.83 0.91 0.78 0.79 0.77  CALCIUM 8.8*  --  8.7* 8.9 9.0  MG  --   --  1.8  --   --    Liver Function Tests:  Recent Labs Lab 10/06/15 1800  AST 13*  ALT 17  ALKPHOS 63  BILITOT 0.6  PROT 7.8  ALBUMIN 3.4*   No results for input(s): LIPASE, AMYLASE in the last 168 hours. No results for input(s): AMMONIA in the last 168 hours. CBC:  Recent Labs Lab 10/06/15 1800 10/07/15 0010 10/08/15 0603 10/09/15 0558  WBC 5.6 6.0 6.6 7.1  NEUTROABS 3.4  --   --   --   HGB 9.7* 10.0* 9.9* 10.4*  HCT 34.2* 34.3* 34.5* 36.6  MCV 86.4 86.0 86.7 85.7  PLT 243 217 219 239   Cardiac Enzymes:  Recent Labs Lab 10/06/15 1800 10/07/15 0010 10/07/15 0602 10/07/15 1225   TROPONINI <0.03 <0.03 <0.03 <0.03   BNP: BNP (last 3 results)  Recent Labs  06/22/15 1743 09/06/15 1440 10/06/15 1800  BNP 147.9* 342.5* 532.1*    ProBNP (last 3 results) No results for input(s): PROBNP in the last 8760 hours.  CBG:  Recent Labs Lab 10/06/15 1826  GLUCAP 75       Signed:  Lacrecia Delval  [facility] 10/09/2015, 10:49 AM

## 2015-10-09 NOTE — Progress Notes (Signed)
RN reviewed CHF booklet with patient. Information given and at bedside. Teach back incorporated. All questions answered at this time. Patient verbalized that daughter will provide transport at 1800.    Ave Filter, RN

## 2015-10-09 NOTE — Progress Notes (Signed)
I was contacted by Case Manager regarding HF Follow-up appt for Tonya Hogan.  She has no identified PCP or Cardiologist, therefore I have referred her for a transition appt with the [facility] on November 29th.  She is aware and agreeable.  She tells me that she will have her sister-in-law transport her to our clinic on that day.  Based on Provider input at that appt I may also refer her for our Paramedicine program.

## 2015-10-09 NOTE — Progress Notes (Signed)
Discharge instructions reviewed with patient. CHF packet reviewed once again. All questions answered at this time. RXs given. Equipments at bedside. Transport home by family.    Ave Filter, RN

## 2015-10-09 NOTE — Progress Notes (Signed)
Physical Therapy Treatment Patient Details Name: Tonya Hogan MRN: [id number] DOB: 1951/06/17 Today's Date: 10/09/2015    History of Present Illness Pt is a 64 y/o female with a PMH of HTN, thyroid cancer, hypothyroidism, CAD s/p CABG, COPD, CHF EF 55-60%. Pt presents with SOB and L facial droop. Imaging reveals no acute abnormalities.    PT Comments    Pt progressing towards physical therapy goals. Was able to perform transfers and ambulation with supervision for safety, and took multiple standing rest breaks to control dyspnea and fatigue. Pt anticipating d/c home today. DME present in room. Continue to recommend HHPT to follow.   Follow Up Recommendations  Home health PT;Supervision for mobility/OOB     Equipment Recommendations  Rolling walker with 5" wheels;3in1 (PT)    Recommendations for Other Services       Precautions / Restrictions Precautions Precautions: Fall Restrictions Weight Bearing Restrictions: No    Mobility  Bed Mobility               General bed mobility comments: Pt sitting in recliner when PT arrived.   Transfers Overall transfer level: Needs assistance Equipment used: Rolling walker (2 wheeled) Transfers: Sit to/from Stand Sit to Stand: Supervision Stand pivot transfers: Supervision       General transfer comment: Supervision for safety. No imablances noted.   Ambulation/Gait Ambulation/Gait assistance: Supervision Ambulation Distance (Feet): 300 Feet Assistive device: Rolling walker (2 wheeled) Gait Pattern/deviations: Step-through pattern;Decreased stride length;Trunk flexed Gait velocity: Decreased Gait velocity interpretation: Below normal speed for age/gender General Gait Details: Good management of RW. No unsteadiness or LOB noted with UE support.    Stairs            Wheelchair Mobility    Modified Rankin (Stroke Patients Only)       Balance Overall balance assessment: Needs assistance Sitting-balance  support: Feet supported;No upper extremity supported Sitting balance-Leahy Scale: Good     Standing balance support: No upper extremity supported Standing balance-Leahy Scale: Fair                      Cognition Arousal/Alertness: Awake/alert Behavior During Therapy: WFL for tasks assessed/performed Overall Cognitive Status: Within Functional Limits for tasks assessed                      Exercises      General Comments        Pertinent Vitals/Pain Pain Assessment: No/denies pain    Home Living                      Prior Function            PT Goals (current goals can now be found in the care plan section) Acute Rehab PT Goals Patient Stated Goal: Home with family PT Goal Formulation: With patient Time For Goal Achievement: 10/14/15 Potential to Achieve Goals: Good Progress towards PT goals: Progressing toward goals    Frequency  Min 3X/week    PT Plan Current plan remains appropriate    Co-evaluation             End of Session Equipment Utilized During Treatment: Gait belt;Oxygen Activity Tolerance: Patient tolerated treatment well Patient left: in chair;with call bell/phone within reach;with chair alarm set     Time: [id number] PT Time Calculation (min) (ACUTE ONLY): 16 min  Charges:  $Gait Training: 8-22 mins  G Codes:      Rolinda Roan 10/09/2015, 3:26 PM  Rolinda Roan, PT, DPT Acute Rehabilitation Services Pager: [phone]

## 2015-10-09 NOTE — Progress Notes (Signed)
Occupational Therapy Treatment Patient Details Name: Tonya Hogan MRN: [id number] DOB: February 06, 1951 Today's Date: 10/09/2015    History of present illness Pt is a 64 y/o female with a PMH of HTN, thyroid cancer, hypothyroidism, CAD s/p CABG, COPD, CHF EF 55-60%. Pt presents with SOB and L facial droop. CT of head reveals no acute abnormalities, x-ray of chest revealed interstitial pulmonary edema    OT comments  Pt progressing towards acute OT goals. Focus of session was toilet and shower transfers, and education energy conservation techniques. Session details below. D/c plan remains appropriate.   Follow Up Recommendations  No OT follow up    Equipment Recommendations  3 in 1 bedside comode;Other (comment)    Recommendations for Other Services      Precautions / Restrictions Precautions Precautions: Fall Restrictions Weight Bearing Restrictions: No       Mobility Bed Mobility               General bed mobility comments: Pt sitting EOB when OT arrived.   Transfers Overall transfer level: Needs assistance Equipment used: Rolling walker (2 wheeled) Transfers: Sit to/from Stand Sit to Stand: Supervision         General transfer comment: from EOB, 3n1, and recliner; cues for hand placement    Balance Overall balance assessment: Needs assistance Sitting-balance support: No upper extremity supported;Feet supported Sitting balance-Leahy Scale: Good     Standing balance support: Bilateral upper extremity supported;During functional activity Standing balance-Leahy Scale: Fair                     ADL Overall ADL's : Needs assistance/impaired                         Toilet Transfer: Supervision/safety;Ambulation;BSC;RW Toilet Transfer Details (indicate cue type and reason): cues for technique with rw Toileting- Clothing Manipulation and Hygiene: Supervision/safety;Sit to/from stand   Tub/ Shower Transfer: Walk-in shower;Minimal  assistance;Ambulation;Rolling walker Tub/Shower Transfer Details (indicate cue type and reason): practiced negotiating step into walkin shower with rw this session. Pt plans to initially sponge bathe at home prior to retruning to showers. Educated on shower setup with 3n1 as shower seat and tub transfer technique. Light assist for balance during step up/down. Functional mobility during ADLs: Supervision/safety;Rolling walker General ADL Comments: ADLs practiced as detailed above. Provided energy conservation handout and reviewed.       Vision                     Perception     Praxis      Cognition   Behavior During Therapy: WFL for tasks assessed/performed Overall Cognitive Status: Within Functional Limits for tasks assessed                       Extremity/Trunk Assessment               Exercises     Shoulder Instructions       General Comments      Pertinent Vitals/ Pain       Pain Assessment: No/denies pain  Home Living                                          Prior Functioning/Environment              Frequency Min 2X/week  Progress Toward Goals  OT Goals(current goals can now be found in the care plan section)  Progress towards OT goals: Progressing toward goals  Acute Rehab OT Goals Patient Stated Goal: Home with family OT Goal Formulation: With patient Time For Goal Achievement: 10/14/15 Potential to Achieve Goals: Good ADL Goals Pt Will Perform Grooming: with modified independence;standing Pt Will Perform Upper Body Bathing: with modified independence;sitting Pt Will Perform Lower Body Bathing: with modified independence;sit to/from stand Pt Will Perform Upper Body Dressing: with modified independence;sitting Pt Will Perform Lower Body Dressing: with modified independence;sit to/from stand Pt Will Transfer to Toilet: with modified independence;ambulating;regular height toilet;bedside commode;grab bars Pt  Will Perform Toileting - Clothing Manipulation and hygiene: with modified independence;sit to/from stand Pt Will Perform Tub/Shower Transfer: Tub transfer;with supervision;3 in 1;rolling walker;ambulating  Plan Discharge plan remains appropriate    Co-evaluation                 End of Session Equipment Utilized During Treatment: Oxygen;Other (comment) (off oxygen to ambulate to bathroom - cleared with nurse)   Activity Tolerance Patient tolerated treatment well   Patient Left in chair;with call bell/phone within reach;with chair alarm set   Nurse Communication          Time: [id number] OT Time Calculation (min): 24 min  Charges: OT General Charges $OT Visit: 1 Procedure OT Treatments $Self Care/Home Management : 23-37 mins  Hortencia Pilar 10/09/2015, 3:15 PM

## 2015-10-09 NOTE — Care Management Important Message (Signed)
Important Message  Patient Details  Name: Tonya Hogan MRN: [id number] Date of Birth: [birth year]   Medicare Important Message Given:  Yes    Abb Gobert P [place] 10/09/2015, 12:15 PM

## 2015-10-09 NOTE — Care Management Note (Addendum)
Case Management Note  Patient Details  Name: Tonya Hogan MRN: [id number] Date of Birth: 1951-04-23  Subjective/Objective:                    Action/Plan: Met with patient to discuss discharge planning. Patient states that she IS on oxygen at home, but does not remember the name of the company. She states that she was with [facility] at one time, but CM verified that patient was no longer active with them for oxygen therapy.  CM discussed with patient that she will need a portable oxygen tank to discharge home.  She states that she has spoken with her family and they will be bringing one when they pick her up this evening.  Patient was unable to name her PCP and CM was unable to reach family to verify whether patient had been following with anyone.  Daphne with the Heart Failure team has made arrangements for patient to establish with the Heart Failure Clinic on 10/21/15. Patient states that she DOES have transportation to that appointment.  CM provided patient with the [facility] number to establish with a PCP if she is unable to verify that she has one.  Patient is agreeable to home health services and has chosen [facility]. Miranda with [facility] was notified and has accepted the referral for discharge home today. Bedside RN updated. Patient is being discharged home to a new address: [address] [place], [place]  Addendum 10/10/15:  CM spoke with Loanne Drilling [phone] with [facility], who verified that patient is active with them for home oxygen.  Per previous medical records from [facility], patient's PCP is Carter Kitten [phone].  Expected Discharge Date:                  Expected Discharge Plan:  [place]  In-House Referral:     Discharge planning Services  CM Consult, Follow-up appt scheduled, HF Clinic  Post Acute Care Choice:  Durable Medical Equipment, Home Health Choice offered to:     DME Arranged:  3-N-1,  [place] Rile DME Agency:  [place]:  RN [facility] Agency:  [place]  Status of Service:  In process, will continue to follow  Medicare Important Message Given:  Yes Date Medicare IM Given:    Medicare IM give by:    Date Additional Medicare IM Given:    Additional Medicare Important Message give by:     If discussed at [place] of Stay Meetings, dates discussed:    Additional Comments:  Tonya Baptise, RN 10/09/2015, 1:37 PM

## 2015-10-13 ENCOUNTER — Telehealth (HOSPITAL_COMMUNITY): Payer: Self-pay

## 2015-10-13 NOTE — Telephone Encounter (Signed)
[place] Nurse with report. Discharged from hospital 10/09/15 Has new RX ordered and is taking Told to resume RX that she had prior to hospitalization.  Does not have refills for these (see DC instructions). Had no PCP or cardiologist prior to latest admission.   Appointment with [facility] 11/29 Patient will bring all bottles of medicine in with her.  [facility] Nurse given order from Dr. Haroldine Laws for social service consult

## 2015-10-20 ENCOUNTER — Telehealth (HOSPITAL_COMMUNITY): Payer: Self-pay

## 2015-10-20 NOTE — Telephone Encounter (Signed)
[facility] called with report No potassium at home.  Has RX at [facility] but says she does not have the $12.75 to pick it up.  Taking a friends old potasium script. Started taking Green Tea Fat Burner. Patient will bring her medication bottles with her to OV tomorrow.

## 2015-10-21 ENCOUNTER — Encounter: Payer: Self-pay | Admitting: Licensed Clinical Social Worker

## 2015-10-21 ENCOUNTER — Ambulatory Visit (HOSPITAL_COMMUNITY)
Admission: RE | Admit: 2015-10-21 | Discharge: 2015-10-21 | Disposition: A | Discharge status: Home / Self Care | Payer: Medicare Other | Source: Ambulatory Visit | Attending: Internal Medicine | Admitting: Internal Medicine

## 2015-10-21 VITALS — BP 122/64 | HR 91 | Ht 61.0 in | Wt 258.0 lb

## 2015-10-21 DIAGNOSIS — J449 Chronic obstructive pulmonary disease, unspecified: Secondary | ICD-10-CM | POA: Diagnosis not present

## 2015-10-21 DIAGNOSIS — Z951 Presence of aortocoronary bypass graft: Secondary | ICD-10-CM | POA: Diagnosis not present

## 2015-10-21 DIAGNOSIS — I5033 Acute on chronic diastolic (congestive) heart failure: Secondary | ICD-10-CM

## 2015-10-21 DIAGNOSIS — E039 Hypothyroidism, unspecified: Secondary | ICD-10-CM | POA: Insufficient documentation

## 2015-10-21 DIAGNOSIS — E669 Obesity, unspecified: Secondary | ICD-10-CM | POA: Diagnosis not present

## 2015-10-21 DIAGNOSIS — Z7982 Long term (current) use of aspirin: Secondary | ICD-10-CM | POA: Diagnosis not present

## 2015-10-21 DIAGNOSIS — Z9981 Dependence on supplemental oxygen: Secondary | ICD-10-CM | POA: Diagnosis not present

## 2015-10-21 DIAGNOSIS — I5032 Chronic diastolic (congestive) heart failure: Secondary | ICD-10-CM | POA: Insufficient documentation

## 2015-10-21 DIAGNOSIS — I2510 Atherosclerotic heart disease of native coronary artery without angina pectoris: Secondary | ICD-10-CM

## 2015-10-21 DIAGNOSIS — F3110 Bipolar disorder, current episode manic without psychotic features, unspecified: Secondary | ICD-10-CM

## 2015-10-21 DIAGNOSIS — Z79899 Other long term (current) drug therapy: Secondary | ICD-10-CM | POA: Insufficient documentation

## 2015-10-21 DIAGNOSIS — Z85850 Personal history of malignant neoplasm of thyroid: Secondary | ICD-10-CM | POA: Insufficient documentation

## 2015-10-21 DIAGNOSIS — F329 Major depressive disorder, single episode, unspecified: Secondary | ICD-10-CM | POA: Diagnosis not present

## 2015-10-21 DIAGNOSIS — E038 Other specified hypothyroidism: Secondary | ICD-10-CM

## 2015-10-21 DIAGNOSIS — R29810 Facial weakness: Secondary | ICD-10-CM | POA: Insufficient documentation

## 2015-10-21 DIAGNOSIS — E079 Disorder of thyroid, unspecified: Secondary | ICD-10-CM | POA: Insufficient documentation

## 2015-10-21 DIAGNOSIS — I10 Essential (primary) hypertension: Secondary | ICD-10-CM

## 2015-10-21 DIAGNOSIS — I2583 Coronary atherosclerosis due to lipid rich plaque: Secondary | ICD-10-CM

## 2015-10-21 LAB — BRAIN NATRIURETIC PEPTIDE: B Natriuretic Peptide: 152.1 pg/mL — ABNORMAL HIGH (ref 0.0–100.0)

## 2015-10-21 LAB — BASIC METABOLIC PANEL
Anion gap: 6 (ref 5–15)
BUN: 28 mg/dL — AB (ref 6–20)
CHLORIDE: 99 mmol/L — AB (ref 101–111)
CO2: 36 mmol/L — AB (ref 22–32)
CREATININE: 0.98 mg/dL (ref 0.44–1.00)
Calcium: 9.1 mg/dL (ref 8.9–10.3)
GFR calc Af Amer: >60 mL/min (ref >60)
GFR calc non Af Amer: 60 mL/min — ABNORMAL LOW (ref >60)
Glucose, Bld: 96 mg/dL (ref 65–99)
Potassium: 4.4 mmol/L (ref 3.5–5.1)
Sodium: 141 mmol/L (ref 135–145)

## 2015-10-21 NOTE — Patient Instructions (Signed)
Follow up in 2 weeks  Labs today will call if abnormal

## 2015-10-21 NOTE — Progress Notes (Addendum)
Patient ID: Tonya Hogan, female   DOB: 02-Jan-1951, 64 y.o.   MRN: [id number]  PCP: None  Primary Cardiologist: None   HPI: Tonya Hogan is a 64 y.o. female with PMH of hypertension, thyroid cancer, hypothyroidism, CAD, s/p CABG 01/31/2010, bipolar, obesity, COPD, chronic anemia, diastolic congestive heart failure with EF 55-60% and grade 2 diastolic dysfunction, depression, and bipolar disorder.   She has had 2 hospital admits in the last 4 months for volume overload. Most recent admit 11/14 through 10/09/15 with volume overload and facial droop. Neurologic work up was negative. Diuresed with IV lasix and transitioned to lasix 40 mg twice a day. Discharge weight 257 pounds.   Today she presents as new patient for heart failure follow up. Overall feeling ok. Denies dyspnea/PND/Orthopnea. Weight at home 252-258 pounds. Sleeps on 3 pillows. Wears 2-3 liters of oxygen continuously. Drinking lots of fluid. Taking medications.Only taking lasix as needed.  Taking lasix 3-4 days a week. [facility] following. She has difficulty with transportation.  She does not drive. She does not drive. Does not have PCP.   BMET 10/09/15 K 4.0 Creatinine 0.77   ROS: All systems negative except as listed in HPI, PMH and Problem List.  SH:  Social History   Social History  . Marital Status: Widowed    Spouse Name: N/A  . Number of Children: N/A  . Years of Education: N/A   Occupational History  . Not on file.   Social History Main Topics  . Smoking status: Never Smoker   . Smokeless tobacco: Never Used  . Alcohol Use: No  . Drug Use: No  . Sexual Activity: No   Other Topics Concern  . Not on file   Social History Narrative    FH:  Family History  Problem Relation Age of Onset  . Hypertension Sister   . Bipolar disorder Other     Past Medical History  Diagnosis Date  . Depression   . Hypertension   . Thyroid disease   . Thyroid ca ([place])   . S/P CABG x 3   . Arthritis   . Obesity   . Bipolar 1  disorder ([place])   . CHF (congestive heart failure) ([place])   . Hypothyroidism   . CAD (coronary artery disease)     Current Outpatient Prescriptions  Medication Sig Dispense Refill  . acetaminophen (RA ACETAMINOPHEN) 650 MG CR tablet Take 650 mg by mouth every 8 (eight) hours as needed. pain    . aspirin 325 MG tablet Take 1 tablet (325 mg total) by mouth daily. 30 tablet 0  . atorvastatin (LIPITOR) 40 MG tablet Take 1 tablet (40 mg total) by mouth daily at 6 PM. 30 tablet 0  . divalproex (DEPAKOTE SPRINKLE) 125 MG capsule Take 250 mg by mouth 2 (two) times daily.    . furosemide (LASIX) 40 MG tablet Take 1 tablet (40 mg total) by mouth 2 (two) times daily. 60 tablet 0  . gabapentin (NEURONTIN) 100 MG capsule Take 200 mg by mouth at bedtime.    Marland Kitchen levothyroxine (SYNTHROID, LEVOTHROID) 125 MCG tablet Take 125 mcg by mouth daily before breakfast.    . lisinopril (PRINIVIL,ZESTRIL) 20 MG tablet Take 20 mg by mouth daily.    . miconazole (MICONAZOLE 7) 2 % vaginal cream Place 1 Applicatorful vaginally at bedtime.    . naproxen sodium (ANAPROX) 220 MG tablet Take 440 mg by mouth 2 (two) times daily as needed (pain).    . potassium chloride SA (K-DUR,KLOR-CON)  20 MEQ tablet Take 1 tablet (20 mEq total) by mouth daily. 30 tablet 0  . albuterol (PROVENTIL HFA;VENTOLIN HFA) 108 (90 BASE) MCG/ACT inhaler Inhale 2 puffs into the lungs every 6 (six) hours as needed for wheezing or shortness of breath. (Patient not taking: Reported on 10/21/2015) 1 Inhaler 0  . ferrous sulfate 325 (65 FE) MG tablet Take 325 mg by mouth daily with breakfast.    . hydrOXYzine (ATARAX/VISTARIL) 25 MG tablet Take 25 mg by mouth 3 (three) times daily as needed for anxiety or itching.     Marland Kitchen oxyCODONE (OXY IR/ROXICODONE) 5 MG immediate release tablet Take 1 tablet (5 mg total) by mouth every 4 (four) hours as needed for severe pain. (Patient not taking: Reported on 10/21/2015) 10 tablet 0  . risperiDONE (RISPERDAL) 0.5 MG tablet Take  1.5 mg by mouth at bedtime.    . sertraline (ZOLOFT) 25 MG tablet Take 25 mg by mouth daily.    Marland Kitchen zolpidem (AMBIEN) 5 MG tablet Take 5 mg by mouth at bedtime as needed for sleep.     No current facility-administered medications for this encounter.    Filed Vitals:   10/21/15 1132  BP: 122/64  Pulse: 91  Height: 5\' 1"  (1.549 m)  Weight: 258 lb (117.028 kg)  SpO2: 97%    PHYSICAL EXAM:  General:  Well appearing. No resp difficulty. Daughter present.  HEENT: normal Neck: supple. JVP7-8.  Carotids 2+ bilaterally; no bruits. No lymphadenopathy or thryomegaly appreciated. Cor: PMI normal. Regular rate & rhythm. No rubs, gallops or murmurs. Lungs: clear Abdomen: obese, soft, nontender, nondistended. No hepatosplenomegaly. No bruits or masses. Good bowel sounds. Extremities: no cyanosis, clubbing, rash, RLE and LLE no edema. Neuro: alert & orientedx3, cranial nerves grossly intact. Moves all 4 extremities w/o difficulty. Affect pleasant.   ASSESSMENT & PLAN: 1. Chronic Diastolic Heart Failure. ECHO 06/24/2015 EF 55-60%. Grade II  DD.  NYHA III. Volume status stable. Only taking lasix 3 days week. I have asked her to continue lasix 40 mg twice a day. Check BMEt today  Lengthy discussion about daily weights, low salt diet, and limiting fluid intake to < 2 liters per day.  2. HTN- Stable today. Continue current dose lisinopril.  3. CAD H/O CABG - no evidence of ischemia. Continue aspirin and atorvastatin.  4. H/O Bipolar Disorder- Needs PCP for management.  5. Hypothyroidism- Continue synthroid. Needs PCP for management.   She is at high risk for readmit due to poor insight. Refer to SW for assistance with PCP and transportation. Hopefully [facility]  PCP. Follow up in 4-6 weeks.   Of note, patient requires head of bed to be elevated more than 30 degrees most of the time due to CHF. Patient requires frequent repositioning in hospital bed with gel overlay in ways that cannot be achieved via wedge  pillow or ordinary bed to facilitate breathing with CHF and prevent bed sores due to low mobility and poor circulation.   Amy Clegg  NP-C   3:58 PM

## 2015-10-21 NOTE — Progress Notes (Signed)
CSW referred to assist patient with resources and PCP. Patient and Daughter shared recent loss of all of their belongings due to bed bugs. Daughter reports they are in need of clothing and furniture. CSW provided some resources such as [employer] and [employer]. CSW also provided food pantry list. CSW provided daughter with some resources for [facility] card and PCP providers for herself and explained patient already auto assigned to PCP provider listed on her medicaid card ( [facility] in [place]). Daughter verbalizes understanding of follow up and resources provided. CSW will be available as needed to further assist patient. Tonya Hogan, [place]

## 2015-10-27 ENCOUNTER — Telehealth (HOSPITAL_COMMUNITY): Payer: Self-pay

## 2015-10-27 NOTE — Telephone Encounter (Signed)
Patients HHRN Tonya Hogan called to report 6 lb weight gain since Friday.  Patient has no swelling, abd girth and ankle circ still the same, no SOB, feels fine. Patient does report a gospel outing where they ate a lot of pizza. Patient has apt 12/14, and also has another Page visit later in the week. Will continue to follow. Renee Pain

## 2015-10-29 ENCOUNTER — Telehealth (HOSPITAL_COMMUNITY): Payer: Self-pay | Admitting: *Deleted

## 2015-10-29 NOTE — Telephone Encounter (Signed)
Anderson Malta, RN w/[facility] called to get an order to see pt 1 more time before discharging her, pt was suppose to be d/c but since she had wt gain she wanted to see her one more time first to make sure pt's ok.  Gave VO for 1 more visit

## 2015-10-30 ENCOUNTER — Telehealth (HOSPITAL_COMMUNITY): Payer: Self-pay

## 2015-10-30 NOTE — Addendum Note (Signed)
Encounter addended by: Jolaine Artist, MD on: 10/30/2015  4:17 PM<BR>     Documentation filed: Notes Section

## 2015-10-30 NOTE — Telephone Encounter (Signed)
PT called to have statement of medical necessity and OV notes addended and faxed to order patient home hospital bed and gel overlay. All necessary paperwork faxed to provided number.  Renee Pain

## 2015-10-30 NOTE — Telephone Encounter (Signed)
Merry Proud with [facility] called CHF triage line to f/u on paperwork for statement of medical necessity and addended OV notes from Dr. Haroldine Laws to support need for hospital bed with gel overlay within the home.  Paperwork completed, however, addendum has not yet been done.  Will f.u with Dr. Haroldine Laws and have all necessary paperwork faxed over ASAP.  Renee Pain

## 2015-11-05 ENCOUNTER — Ambulatory Visit (HOSPITAL_COMMUNITY)
Admission: RE | Admit: 2015-11-05 | Discharge: 2015-11-05 | Disposition: A | Discharge status: Home / Self Care | Payer: Medicare Other | Source: Ambulatory Visit | Attending: Cardiology | Admitting: Cardiology

## 2015-11-05 VITALS — BP 108/60 | HR 55 | Wt 257.4 lb

## 2015-11-05 DIAGNOSIS — E038 Other specified hypothyroidism: Secondary | ICD-10-CM | POA: Diagnosis not present

## 2015-11-05 DIAGNOSIS — I10 Essential (primary) hypertension: Secondary | ICD-10-CM | POA: Diagnosis not present

## 2015-11-05 DIAGNOSIS — Z951 Presence of aortocoronary bypass graft: Secondary | ICD-10-CM | POA: Diagnosis not present

## 2015-11-05 DIAGNOSIS — I25810 Atherosclerosis of coronary artery bypass graft(s) without angina pectoris: Secondary | ICD-10-CM | POA: Diagnosis not present

## 2015-11-05 DIAGNOSIS — E039 Hypothyroidism, unspecified: Secondary | ICD-10-CM | POA: Insufficient documentation

## 2015-11-05 DIAGNOSIS — F319 Bipolar disorder, unspecified: Secondary | ICD-10-CM | POA: Diagnosis not present

## 2015-11-05 DIAGNOSIS — I5032 Chronic diastolic (congestive) heart failure: Secondary | ICD-10-CM | POA: Diagnosis not present

## 2015-11-05 DIAGNOSIS — I110 Hypertensive heart disease with heart failure: Secondary | ICD-10-CM | POA: Diagnosis not present

## 2015-11-05 MED ORDER — FUROSEMIDE 40 MG PO TABS: 40.0000 mg | ORAL_TABLET | Freq: Two times a day (BID) | ORAL | Status: DC

## 2015-11-05 NOTE — Progress Notes (Signed)
Patient ID: Tonya Hogan, female   DOB: 06/03/1951, 64 y.o.   MRN: [id number]  PCP: None  Primary Cardiologist: None   HPI: Tonya Hogan is a 64 y.o. female with PMH of hypertension, thyroid cancer, hypothyroidism, CAD, s/p CABG 01/31/2010, bipolar, obesity, COPD, chronic anemia, diastolic congestive heart failure with EF 55-60% and grade 2 diastolic dysfunction, depression, and bipolar disorder.   She has had 2 hospital admits in the last 4 months for volume overload. Most recent admit 11/14 through 10/09/15 with volume overload and facial droop. Neurologic work up was negative. Diuresed with IV lasix and transitioned to lasix 40 mg twice a day. Discharge weight 257 pounds.   Today she returns for follow. Last visit she was asked to take lasix 40 mg po twice a day. She also had issues with noncompliance. Now she says she is taking all medications. Denies SOB/PND/Orthopnea. Has hospital bed. Weight at home 257 pounds. Following low salt diet. Drinking less than 2 liters. [facility] following. She has difficulty with transportation.  She does not drive. She does not drive. Does not have PCP.   BMET 10/09/15 K 4.0 Creatinine 0.77 BMET 10/21/2015: K 4.4 Creatinine 0.98 BNP 152.    ROS: All systems negative except as listed in HPI, PMH and Problem List.  SH:  Social History   Social History  . Marital Status: Widowed    Spouse Name: N/A  . Number of Children: N/A  . Years of Education: N/A   Occupational History  . Not on file.   Social History Main Topics  . Smoking status: Never Smoker   . Smokeless tobacco: Never Used  . Alcohol Use: No  . Drug Use: No  . Sexual Activity: No   Other Topics Concern  . Not on file   Social History Narrative    FH:  Family History  Problem Relation Age of Onset  . Hypertension Sister   . Bipolar disorder Other     Past Medical History  Diagnosis Date  . Depression   . Hypertension   . Thyroid disease   . Thyroid ca ([place])   . S/P CABG x 3     . Arthritis   . Obesity   . Bipolar 1 disorder ([place])   . CHF (congestive heart failure) ([place])   . Hypothyroidism   . CAD (coronary artery disease)     Current Outpatient Prescriptions  Medication Sig Dispense Refill  . acetaminophen (RA ACETAMINOPHEN) 650 MG CR tablet Take 650 mg by mouth every 8 (eight) hours as needed. pain    . albuterol (PROVENTIL HFA;VENTOLIN HFA) 108 (90 BASE) MCG/ACT inhaler Inhale 2 puffs into the lungs every 6 (six) hours as needed for wheezing or shortness of breath. (Patient not taking: Reported on 10/21/2015) 1 Inhaler 0  . aspirin 325 MG tablet Take 1 tablet (325 mg total) by mouth daily. 30 tablet 0  . atorvastatin (LIPITOR) 40 MG tablet Take 1 tablet (40 mg total) by mouth daily at 6 PM. 30 tablet 0  . divalproex (DEPAKOTE SPRINKLE) 125 MG capsule Take 250 mg by mouth 2 (two) times daily.    . ferrous sulfate 325 (65 FE) MG tablet Take 325 mg by mouth daily with breakfast.    . furosemide (LASIX) 40 MG tablet Take 1 tablet (40 mg total) by mouth 2 (two) times daily. 60 tablet 0  . gabapentin (NEURONTIN) 100 MG capsule Take 200 mg by mouth at bedtime.    . hydrOXYzine (ATARAX/VISTARIL) 25 MG tablet  Take 25 mg by mouth 3 (three) times daily as needed for anxiety or itching.     . levothyroxine (SYNTHROID, LEVOTHROID) 125 MCG tablet Take 125 mcg by mouth daily before breakfast.    . lisinopril (PRINIVIL,ZESTRIL) 20 MG tablet Take 20 mg by mouth daily.    . miconazole (MICONAZOLE 7) 2 % vaginal cream Place 1 Applicatorful vaginally at bedtime.    . naproxen sodium (ANAPROX) 220 MG tablet Take 440 mg by mouth 2 (two) times daily as needed (pain).    Marland Kitchen oxyCODONE (OXY IR/ROXICODONE) 5 MG immediate release tablet Take 1 tablet (5 mg total) by mouth every 4 (four) hours as needed for severe pain. (Patient not taking: Reported on 10/21/2015) 10 tablet 0  . potassium chloride SA (K-DUR,KLOR-CON) 20 MEQ tablet Take 1 tablet (20 mEq total) by mouth daily. 30 tablet 0  .  risperiDONE (RISPERDAL) 0.5 MG tablet Take 1.5 mg by mouth at bedtime.    . sertraline (ZOLOFT) 25 MG tablet Take 25 mg by mouth daily.    Marland Kitchen zolpidem (AMBIEN) 5 MG tablet Take 5 mg by mouth at bedtime as needed for sleep.     No current facility-administered medications for this encounter.    Filed Vitals:   11/05/15 1016  BP: 108/60  Pulse: 55  Weight: 257 lb 6.4 oz (116.756 kg)  SpO2: 93%    PHYSICAL EXAM:  General:  Well appearing. No resp difficulty. Daughter present.  HEENT: normal Neck: supple. JVP  6-7  Carotids 2+ bilaterally; no bruits. No lymphadenopathy or thryomegaly appreciated. Cor: PMI normal. Regular rate & rhythm. No rubs, gallops or murmurs. Lungs: clear Abdomen: obese, soft, nontender, nondistended. No hepatosplenomegaly. No bruits or masses. Good bowel sounds. Extremities: no cyanosis, clubbing, rash, RLE and LLE no edema. Neuro: alert & orientedx3, cranial nerves grossly intact. Moves all 4 extremities w/o difficulty. Affect pleasant.   ASSESSMENT & PLAN: 1. Chronic Diastolic Heart Failure. ECHO 06/24/2015 EF 55-60%. Grade II  DD.  NYHA III. Much better today and compliant with medications. Volume status stable. Continue lasix 40 mg twice a day. Instructed to take an extra 40 mg of lasix if weight is above 260 pounds.  Lengthy discussion about daily weights, low salt diet, and limiting fluid intake to < 2 liters per day.  2. HTN- Stable today. Continue current dose lisinopril.  3. CAD H/O CABG - no evidence of ischemia. Continue aspirin and atorvastatin.  4. H/O Bipolar Disorder- Needs PCP for management.  5. Hypothyroidism- Continue synthroid. Needs PCP for management.   Follow up in 6 weeks.     Amy Clegg  NP-C   10:19 AM

## 2015-11-05 NOTE — Patient Instructions (Signed)
ADD additional 40 mg in the AM for weight 266 or greater  Your physician recommends that you schedule a follow-up appointment in: 6 weeks in the NP clinic  Do the following things EVERYDAY: 1) Weigh yourself in the morning before breakfast. Write it down and keep it in a log. 2) Take your medicines as prescribed 3) Eat low salt foods-Limit salt (sodium) to 2000 mg per day.  4) Stay as active as you can everyday 5) Limit all fluids for the day to less than 2 liters 6)

## 2015-11-05 NOTE — Progress Notes (Signed)
Advanced Heart Failure Medication Review by a Pharmacist  Does the patient  feel that his/her medications are working for him/her?  yes  Has the patient been experiencing any side effects to the medications prescribed?  no  Does the patient measure his/her own blood pressure or blood glucose at home?  yes   Does the patient have any problems obtaining medications due to transportation or finances?   no  Understanding of regimen: good Understanding of indications: good Potential of compliance: excellent Patient understands to avoid NSAIDs. Patient understands to avoid decongestants.  Issues to address at subsequent visits: None   Pharmacist comments:  Tonya Hogan is a pleasant 64 yo F presenting with her daughter and a current medication list. She reports excellent compliance with her medications and did not have any specific medication-related questions or concerns for me at this time.  [name]. Velva Harman, PharmD, BCPS, CPP Clinical Pharmacist Pager: [phone] Phone: [phone] 11/05/2015 10:35 AM      Time with patient: 6 minutes Preparation and documentation time: 2 minutes Total time: 8 minutes

## 2015-12-23 ENCOUNTER — Inpatient Hospital Stay (HOSPITAL_COMMUNITY): Admission: RE | Admit: 2015-12-23 | Payer: Self-pay | Source: Ambulatory Visit

## 2016-01-08 ENCOUNTER — Other Ambulatory Visit (HOSPITAL_COMMUNITY): Payer: Self-pay | Admitting: *Deleted

## 2016-01-08 MED ORDER — LISINOPRIL 20 MG PO TABS: 20.0000 mg | ORAL_TABLET | Freq: Every day | ORAL | Status: DC

## 2016-06-30 ENCOUNTER — Emergency Department (HOSPITAL_BASED_OUTPATIENT_CLINIC_OR_DEPARTMENT_OTHER)
Admission: EM | Admit: 2016-06-30 | Discharge: 2016-06-30 | Disposition: A | Discharge status: Other Acute Inpatient Hospital | Payer: Medicare Other | Attending: Emergency Medicine | Admitting: Emergency Medicine

## 2016-06-30 ENCOUNTER — Emergency Department (HOSPITAL_BASED_OUTPATIENT_CLINIC_OR_DEPARTMENT_OTHER): Payer: Medicare Other

## 2016-06-30 ENCOUNTER — Encounter (HOSPITAL_BASED_OUTPATIENT_CLINIC_OR_DEPARTMENT_OTHER): Payer: Self-pay | Admitting: Emergency Medicine

## 2016-06-30 DIAGNOSIS — I2510 Atherosclerotic heart disease of native coronary artery without angina pectoris: Secondary | ICD-10-CM | POA: Insufficient documentation

## 2016-06-30 DIAGNOSIS — I509 Heart failure, unspecified: Secondary | ICD-10-CM | POA: Diagnosis not present

## 2016-06-30 DIAGNOSIS — I110 Hypertensive heart disease with heart failure: Secondary | ICD-10-CM | POA: Diagnosis not present

## 2016-06-30 DIAGNOSIS — Z79899 Other long term (current) drug therapy: Secondary | ICD-10-CM | POA: Insufficient documentation

## 2016-06-30 DIAGNOSIS — F309 Manic episode, unspecified: Secondary | ICD-10-CM | POA: Insufficient documentation

## 2016-06-30 DIAGNOSIS — E039 Hypothyroidism, unspecified: Secondary | ICD-10-CM | POA: Insufficient documentation

## 2016-06-30 LAB — RAPID URINE DRUG SCREEN, HOSP PERFORMED
AMPHETAMINES: NOT DETECTED
BARBITURATES: NOT DETECTED
BENZODIAZEPINES: NOT DETECTED
COCAINE: NOT DETECTED
Opiates: NOT DETECTED
TETRAHYDROCANNABINOL: NOT DETECTED

## 2016-06-30 LAB — COMPREHENSIVE METABOLIC PANEL
ALT: 17 U/L (ref 14–54)
ANION GAP: 9 (ref 5–15)
AST: 18 U/L (ref 15–41)
Albumin: 3.7 g/dL (ref 3.5–5.0)
Alkaline Phosphatase: 65 U/L (ref 38–126)
BUN: 13 mg/dL (ref 6–20)
CHLORIDE: 97 mmol/L — AB (ref 101–111)
CO2: 32 mmol/L (ref 22–32)
CREATININE: 0.83 mg/dL (ref 0.44–1.00)
Calcium: 9.3 mg/dL (ref 8.9–10.3)
Glucose, Bld: 107 mg/dL — ABNORMAL HIGH (ref 65–99)
Potassium: 3.5 mmol/L (ref 3.5–5.1)
Sodium: 138 mmol/L (ref 135–145)
Total Bilirubin: 0.5 mg/dL (ref 0.3–1.2)
Total Protein: 8.8 g/dL — ABNORMAL HIGH (ref 6.5–8.1)

## 2016-06-30 LAB — URINALYSIS, ROUTINE W REFLEX MICROSCOPIC
Bilirubin Urine: NEGATIVE
Glucose, UA: NEGATIVE mg/dL
Hgb urine dipstick: NEGATIVE
KETONES UR: NEGATIVE mg/dL
Leukocytes, UA: NEGATIVE
Nitrite: NEGATIVE
PH: 6.5 (ref 5.0–8.0)
PROTEIN: NEGATIVE mg/dL
Specific Gravity, Urine: 1.016 (ref 1.005–1.030)

## 2016-06-30 LAB — CBC WITH DIFFERENTIAL/PLATELET
BASOS ABS: 0.1 10*3/uL (ref 0.0–0.1)
Basophils Relative: 1 %
EOS ABS: 0.3 10*3/uL (ref 0.0–0.7)
EOS PCT: 4 %
HEMATOCRIT: 32.9 % — AB (ref 36.0–46.0)
Hemoglobin: 9.9 g/dL — ABNORMAL LOW (ref 12.0–15.0)
LYMPHS PCT: 27 %
Lymphs Abs: 1.8 10*3/uL (ref 0.7–4.0)
MCH: 24.7 pg — AB (ref 26.0–34.0)
MCHC: 30.1 g/dL (ref 30.0–36.0)
MCV: 82 fL (ref 78.0–100.0)
Monocytes Absolute: 0.9 10*3/uL (ref 0.1–1.0)
Monocytes Relative: 13 %
Neutro Abs: 3.6 10*3/uL (ref 1.7–7.7)
Neutrophils Relative %: 55 %
Platelets: 338 10*3/uL (ref 150–400)
RBC: 4.01 MIL/uL (ref 3.87–5.11)
RDW: 15.4 % (ref 11.5–15.5)
WBC: 6.5 10*3/uL (ref 4.0–10.5)

## 2016-06-30 LAB — ETHANOL

## 2016-06-30 MED ORDER — LORAZEPAM 1 MG PO TABS: 1.0000 mg | ORAL_TABLET | Freq: Three times a day (TID) | ORAL | Status: DC | PRN

## 2016-06-30 NOTE — ED Notes (Signed)
Pt placed on 2L O2 per pt request. Pt is on O2 at night as baseline.

## 2016-06-30 NOTE — ED Triage Notes (Signed)
Pt has history of bipolar.  Pt is having manic episode according to daughter since Friday.

## 2016-06-30 NOTE — Progress Notes (Addendum)
Patient has been recommended psychiatric inpatient treatment, per Catalina Pizza DNP, on 8/9. Patient has been accepted for treatment at [facility].   Referrals have been sent to the following hospitals: [facility] - patient refused treatment at this facility.  Declined at: [facility] - due oxygen [facility] - due need for oxygen  Per patient's nurse Mortimer Fries, patient reported that she is not receiving any treatment for her multiple myeloma, that it has been removed and she only takes Synthroid for her hypothyroidism.   CSW will continue to seek placement.  Verlon Setting, [place] Disposition staff 06/30/2016 8:51 PM [phone]

## 2016-06-30 NOTE — BH Assessment (Signed)
Tele Assessment Note   Tonya Hogan is a 65 y.o. female with a history of Bipolar Disorder I who presented voluntarily to [facility] with complaint of manic behavior.  She was accompanied by her adult daughter Tonya Hogan who was present for the assessment.  The following history was gathered from Pt and from daughter Tonya Hogan:  Per report, Pt was treated inpatient for Bipolar manic episode in August 2016.  She was discharged, but is currently not seeing a psychiatrist or therapist.  Per daughter, Pt is not taking any sort of mood stabilizer.  Both Pt and daughter reported that Pt has had only about 10 hours of sleep over the last six nights, and that she is struggling to stay asleep if she goes to bed.  Per daughter, Pt is engaging in bizarre behavior -- she is cooking a five course dinner at 1 am; she is getting into swearing altercations with people at her church and almost coming to blows with people in the sanctuary; she painted her 45 year old grandson's toenails red.  Pt had little insight into the nature of this behavior -- "I like to cook" and "Let me tell you -- my grandson deserved it."  Pt also stated that she has dreams that come true, has the gift of prophecy, and hears voices (non-commanding).  Per daughter's report, Pt takes Zoloft 50 mg daily and Citalopram 40 mg.  During assessment, Pt was resting on her hospital bed.  She had good eye contact and stared at times.  Demeanor was pleasant and cooperative.  Pt was dressed in street clothes and appeared appropriately groomed.  She reported mood as "fine...tired," and affect was expansive.  Pt endorsed poor sleep and auditory hallucinations.  She denied suicidal ideation and other depressive symptoms.  She also denied self-injury, visual hallucination, and homicidal ideation.  She denied substance use, and UDS/BAC were unremarkable.  Pt's speech was rapid, but otherwise normal in rhythm and volume.  Pt's memory and concentration were intact.   Pt's thought processes seemed racing, and thought content indicated possible delusion (to wit, she believes she has the gift of prophecy and that her dreams come true).  Pt's impulse control, judgment, and insight were deemed poor as evidenced by hallucination and unusual/bizarre/ego-dystonic behavior.  Consulted with C. Withrow, DNP, who recommended inpatient treatment for Pt.  [facility] hospital staff advised.  Diagnosis: Bipolar Disorder I, Manic Episode, severe  Past Medical History:  Past Medical History:  Diagnosis Date  . Arthritis   . Bipolar 1 disorder ([place])   . CAD (coronary artery disease)   . CHF (congestive heart failure) ([place])   . Depression   . Hypertension   . Hypothyroidism   . Obesity   . S/P CABG x 3   . Thyroid ca ([place])   . Thyroid disease     Past Surgical History:  Procedure Laterality Date  . ABDOMINAL HYSTERECTOMY    . CORONARY ARTERY BYPASS GRAFT    . TONSILLECTOMY      Family History:  Family History  Problem Relation Age of Onset  . Hypertension Sister   . Bipolar disorder Other     Social History:  reports that she has never smoked. She has never used smokeless tobacco. She reports that she does not drink alcohol or use drugs.  Additional Social History:  Alcohol / Drug Use Pain Medications: See PTA Prescriptions: See PTA Over the Counter: See PTA History of alcohol / drug use?: No history of  alcohol / drug abuse  CIWA: CIWA-Ar BP: 111/65 Pulse Rate: 82 COWS:    PATIENT STRENGTHS: (choose at least two) Average or above average intelligence Capable of independent living Communication skills General fund of knowledge  Allergies:  Allergies  Allergen Reactions  . Chocolate     Sneezing, coughing  . Peanut-Containing Drug Products     Sneezing,coughing  . Vicodin  [Hydrocodone-Acetaminophen] Rash    Home Medications:  (Not in a hospital admission)  OB/GYN Status:  No LMP recorded. Patient has had a  hysterectomy.  General Assessment Data Location of Assessment: [facility] ([facility] TTS Assessment: In system Is this a Tele or Face-to-Face Assessment?: Tele Assessment Is this an Initial Assessment or a Re-assessment for this encounter?: Initial Assessment Marital status: Widowed Is patient pregnant?: No Pregnancy Status: No Living Arrangements: Children, Other relatives (Daughter Tonya Hogan, grandsons) Can pt return to current living arrangement?: Yes Admission Status: Voluntary Is patient capable of signing voluntary admission?: Yes Referral Source: Self/Family/Friend Insurance type: [place] MCD; MCR  Medical Screening Exam [facility] Walk-in ONLY) Medical Exam completed: Yes  Crisis Care Plan Living Arrangements: Children, Other relatives (Daughter Tonya Hogan, grandsons) Name of Psychiatrist: None currently Name of Therapist: None currently  Education Status Is patient currently in school?: No Highest grade of school patient has completed: 11  Risk to self with the past 6 months Suicidal Ideation: No Has patient been a risk to self within the past 6 months prior to admission? : No Suicidal Intent: No Has patient had any suicidal intent within the past 6 months prior to admission? : No Is patient at risk for suicide?: No Suicidal Plan?: No Has patient had any suicidal plan within the past 6 months prior to admission? : No Access to Means: No What has been your use of drugs/alcohol within the last 12 months?: Denied Previous Attempts/Gestures: No Intentional Self Injurious Behavior: None Family Suicide History: Unknown Recent stressful life event(s): Other (Comment) (None noted) Persecutory voices/beliefs?: No Depression: Yes Depression Symptoms: Insomnia (poor appetite) Substance abuse history and/or treatment for substance abuse?: No Suicide prevention information given to non-admitted patients: Not applicable  Risk to Others within the past 6 months Homicidal  Ideation: No Does patient have any lifetime risk of violence toward others beyond the six months prior to admission? : No Thoughts of Harm to Others: No Current Homicidal Intent: No Current Homicidal Plan: No Access to Homicidal Means: No History of harm to others?: No Assessment of Violence: In distant past Does patient have access to weapons?: No Criminal Charges Pending?: No Does patient have a court date: No Is patient on probation?: No  Psychosis Hallucinations: Auditory (Hears voices) Delusions: Unspecified (Believes she has dreams that come true - prophecy)  Mental Status Report Appearance/Hygiene: Unremarkable (In street clothes) Eye Contact: Good (Staring) Motor Activity: Unremarkable Speech: Unremarkable, Rapid Level of Consciousness: Alert Mood: Euthymic Affect: Other (Comment) (Expansive) Anxiety Level: None Thought Processes: Circumstantial Judgement: Impaired Orientation: Person, Place, Time, Situation Obsessive Compulsive Thoughts/Behaviors: None  Cognitive Functioning Concentration: Good Memory: Recent Intact, Remote Intact IQ: Average Insight: Poor Impulse Control: Poor Appetite: Good Sleep: Decreased Total Hours of Sleep: 10 (Ten hours sleep over last 6 days) Vegetative Symptoms: None (Not going to bed)  ADLScreening [facility] Assessment Services) Patient's cognitive ability adequate to safely complete daily activities?: Yes Patient able to express need for assistance with ADLs?: Yes Independently performs ADLs?: Yes (appropriate for developmental age)  Prior Inpatient Therapy Prior Inpatient Therapy: Yes Prior Therapy Dates: August 2016  Prior Therapy Facilty/Provider(s): [place] Reason for Treatment: Bipolar Manic  Prior Outpatient Therapy Prior Outpatient Therapy: No Does patient have an ACCT team?: No Does patient have Intensive In-House Services?  : No Does patient have [facility] services? : No Does patient have P4CC  services?: No  ADL Screening (condition at time of admission) Patient's cognitive ability adequate to safely complete daily activities?: Yes Is the patient deaf or have difficulty hearing?: No Does the patient have difficulty seeing, even when wearing glasses/contacts?: No Patient able to express need for assistance with ADLs?: Yes Does the patient have difficulty dressing or bathing?: No Independently performs ADLs?: Yes (appropriate for developmental age) Does the patient have difficulty walking or climbing stairs?: No Weakness of Legs: None Weakness of Arms/Hands: None     Therapy Consults (therapy consults require a physician order) PT Evaluation Needed: No OT Evalulation Needed: No SLP Evaluation Needed: No Abuse/Neglect Assessment (Assessment to be complete while patient is alone) Physical Abuse: Denies Verbal Abuse: Denies Sexual Abuse: Denies Exploitation of patient/patient's resources: Denies Self-Neglect: Denies Values / Beliefs Cultural Requests During Hospitalization: None Spiritual Requests During Hospitalization: None Consults Spiritual Care Consult Needed: No Social Work Consult Needed: No Regulatory affairs officer (For Healthcare) Does patient have an advance directive?: No Would patient like information on creating an advanced directive?: No - patient declined information    Additional Information 1:1 In Past 12 Months?: No CIRT Risk: No Elopement Risk: No Does patient have medical clearance?: Yes     Disposition:  Disposition Initial Assessment Completed for this Encounter: Yes Disposition of Patient: Inpatient treatment program Type of inpatient treatment program: Adult (Per C. Withrow, DNP, Pt meets inpt criteria)  Marlowe Aschoff 06/30/2016 3:49 PM

## 2016-06-30 NOTE — ED Notes (Signed)
MD at bedside to assess pt prior to transport

## 2016-06-30 NOTE — ED Notes (Signed)
Call from [place] with [facility] for adult bed placement states he is going to review the patients chart with his physician for possible admission to his unit and will return call in 5 to 10 minutes. [facility] transport on hold at this time.

## 2016-06-30 NOTE — BH Assessment (Signed)
Pt has been accepted to [facility] and may arrive 8.9.17 before 2300 or 8.10.17 after 0600. Accepting physician is Dr. Launa Grill. Pt has been assigned to [id number]. Mayer Camel staff member was [employer]. Call report [phone]. Mortimer Fries, RN has been informed of pt acceptance.

## 2016-06-30 NOTE — ED Notes (Signed)
Phone conversation with [facility] at which time I informed her that Tonya Hogan is refusing transport and admission to [facility]. Pt states she had an unpleasant visit there previously. Tonya Hogan stated she would continue to search her resources for placement possibilities. Tonya Hogan was informed of her pending transfer to [facility] and was in agreement.

## 2016-06-30 NOTE — ED Provider Notes (Addendum)
[place] DEPT [facility] Provider Note   CSN: [id number] Arrival date & time: 06/30/16  1209  First Provider Contact:  First MD Initiated Contact with Patient 06/30/16 1255        History   Chief Complaint Chief Complaint  Patient presents with  . Manic Behavior    HPI Tonya Hogan is a 65 y.o. female.  The history is provided by the patient.    Tonya Hogan is a 65 y.o. female who presents to the Emergency Department complaining of manic behavior.  Hx is provided by the patient and her daughter.  Level V caveat due to psychiatric disorder.  Over the last two weeks the patient has experienced increased manic behavior with mood swings, swearing spells, decreased sleep. daughter  Past Medical History:  Diagnosis Date  . Arthritis   . Bipolar 1 disorder ([place])   . CAD (coronary artery disease)   . CHF (congestive heart failure) ([place])   . Depression   . Hypertension   . Hypothyroidism   . Obesity   . S/P CABG x 3   . Thyroid ca ([place])   . Thyroid disease     Patient Active Problem List   Diagnosis Date Noted  . Chronic diastolic heart failure ([place]) 11/05/2015  . Facial droop 10/06/2015  . Acute on chronic diastolic heart failure ([place]) 10/06/2015  . Acute on chronic respiratory failure with hypoxia ([place]) 10/06/2015  . CHF exacerbation ([place]) 10/06/2015  . Hypothyroidism   . S/P CABG x 3   . CAD (coronary artery disease)   . Bipolar affective disorder, manic ([place]) 07/08/2015  . COPD (chronic obstructive pulmonary disease) ([place]) 06/26/2015  . Acute pulmonary edema ([place]) 06/24/2015  . Essential hypertension 06/23/2015  . Chronic anemia 06/23/2015  . Post-operative hypothyroidism 06/23/2015  . Shortness of breath 06/23/2015  . Pulmonary edema 06/22/2015    Past Surgical History:  Procedure Laterality Date  . ABDOMINAL HYSTERECTOMY    . CORONARY ARTERY BYPASS GRAFT    . TONSILLECTOMY      OB History    No data available       Home Medications    Prior to  Admission medications   Medication Sig Start Date End Date Taking? Authorizing Provider  OXYGEN Inhale 2 L/min into the lungs as needed.   Yes Historical Provider, MD  sertraline (ZOLOFT) 50 MG tablet Take 50 mg by mouth daily.   Yes Historical Provider, MD  acetaminophen (RA ACETAMINOPHEN) 650 MG CR tablet Take 650 mg by mouth every 8 (eight) hours as needed. pain 03/16/13   Historical Provider, MD  albuterol (PROVENTIL HFA;VENTOLIN HFA) 108 (90 BASE) MCG/ACT inhaler Inhale 2 puffs into the lungs every 6 (six) hours as needed for wheezing or shortness of breath. 06/26/15   Donne Hazel, MD  furosemide (LASIX) 40 MG tablet Take 1 tablet (40 mg total) by mouth 2 (two) times daily. May take additional 40 mg in AM for weight 266 or greater 11/05/15   Amy D Clegg, NP  gabapentin (NEURONTIN) 100 MG capsule Take 200 mg by mouth at bedtime.    Historical Provider, MD  levothyroxine (SYNTHROID, LEVOTHROID) 125 MCG tablet Take 125 mcg by mouth daily before breakfast.    Historical Provider, MD  lisinopril (PRINIVIL,ZESTRIL) 20 MG tablet Take 1 tablet (20 mg total) by mouth daily. 01/08/16   Amy D Clegg, NP  miconazole (MICONAZOLE 7) 2 % vaginal cream Place 1 Applicatorful vaginally at bedtime.    Historical Provider, MD  naproxen sodium (  ANAPROX) 220 MG tablet Take 440 mg by mouth 2 (two) times daily as needed (pain).    Historical Provider, MD  Omega-3 Fatty Acids (FISH OIL) 1000 MG CAPS Take 1,000 mg by mouth daily.    Historical Provider, MD  potassium chloride SA (K-DUR,KLOR-CON) 20 MEQ tablet Take 1 tablet (20 mEq total) by mouth daily. 10/09/15   Geradine Girt, DO    Family History Family History  Problem Relation Age of Onset  . Hypertension Sister   . Bipolar disorder Other     Social History Social History  Substance Use Topics  . Smoking status: Never Smoker  . Smokeless tobacco: Never Used  . Alcohol use No     Allergies   Chocolate; Peanut-containing drug products; and Vicodin   [hydrocodone-acetaminophen]   Review of Systems Review of Systems  All other systems reviewed and are negative.    Physical Exam Updated Vital Signs BP 111/65   Pulse 82   Temp 99.1 F (37.3 C) (Oral)   Resp 20   SpO2 94%   Physical Exam  Constitutional: She is oriented to person, place, and time. She appears well-developed and well-nourished.  HENT:  Head: Normocephalic and atraumatic.  Cardiovascular: Normal rate and regular rhythm.   Pulmonary/Chest: Effort normal. No respiratory distress.  Musculoskeletal: Normal range of motion.  Neurological: She is alert and oriented to person, place, and time.  Skin: Skin is warm.  Psychiatric:  Pressured speech, difficult to re-direct.   Nursing note and vitals reviewed.    ED Treatments / Results  Labs (all labs ordered are listed, but only abnormal results are displayed) Labs Reviewed  COMPREHENSIVE METABOLIC PANEL - Abnormal; Notable for the following:       Result Value   Chloride 97 (*)    Glucose, Bld 107 (*)    Total Protein 8.8 (*)    All other components within normal limits  CBC WITH DIFFERENTIAL/PLATELET - Abnormal; Notable for the following:    Hemoglobin 9.9 (*)    HCT 32.9 (*)    MCH 24.7 (*)    All other components within normal limits  URINE RAPID DRUG SCREEN, HOSP PERFORMED  ETHANOL  URINALYSIS, ROUTINE W REFLEX MICROSCOPIC (NOT AT [facility])    EKG  EKG Interpretation None       Radiology No results found.  Procedures Procedures (including critical care time)  Medications Ordered in ED Medications  LORazepam (ATIVAN) tablet 1 mg (not administered)     Initial Impression / Assessment and Plan / ED Course  I have reviewed the triage vital signs and the nursing notes.  Pertinent labs & imaging results that were available during my care of the patient were reviewed by me and considered in my medical decision making (see chart for details).  Clinical Course    Pt here with decreased  sleep, manic behavior.  Pt denies SI/HI.  She has been medically cleared for psychiatric treatment for acute mania.    Final Clinical Impressions(s) / ED Diagnoses   Final diagnoses:  Mania [facility])    New Prescriptions New Prescriptions   No medications on file     Quintella Reichert, MD 06/30/16 [place], MD 10/05/16 1331

## 2016-06-30 NOTE — ED Notes (Signed)
Pt accepted at [facility] facility per [facility] and [place] [facility] [place]. Accepting phys

## 2016-06-30 NOTE — BHH Counselor (Signed)
Writer received a call from a nurse at [facility] denying pt due to her need for O2.  Edd Fabian, MS, [facility], [facility] Triage Specialist [phone]

## 2016-06-30 NOTE — ED Provider Notes (Signed)
Patient is evaluated for transfer to [facility] facility for psychiatric treatment. She has been assessed by behavioral health and is deemed to have Bipolar I with acute mania.   The patient is alert and pleasant. She is cooperative and interactive. She does endorse that her psychiatric condition has deteriorated and she needs to get help. Heart and lungs are for normal examination. Patient is sitting up at the edges stretcher and she is coordinated and purposeful and all of her activities. Skin warm and dry.  Patient medically stable for transfer to inpatient psychiatric care.   Charlesetta Shanks, MD 06/30/16 2224

## 2016-06-30 NOTE — ED Notes (Signed)
Tonya Hogan questioned about thyroid Hx states she is not on any other treatment for thyroid CA after surgery "because they got it all". Pt reports that she is only taking synthyroid  For thqt illness. VS are currently stable and pt is on 2L N/C O2. She denies current pain or SOB she is A&O x3 and skin color and integretity is WNL for ethnicity.  Report given to [employer] at [facility] ED, OK to send pt via [facility] transport because pt has a ready bed.

## 2016-07-13 ENCOUNTER — Inpatient Hospital Stay (HOSPITAL_BASED_OUTPATIENT_CLINIC_OR_DEPARTMENT_OTHER): Payer: Medicare Other

## 2016-07-13 ENCOUNTER — Emergency Department (HOSPITAL_BASED_OUTPATIENT_CLINIC_OR_DEPARTMENT_OTHER): Payer: Medicare Other

## 2016-07-13 ENCOUNTER — Encounter (HOSPITAL_BASED_OUTPATIENT_CLINIC_OR_DEPARTMENT_OTHER): Payer: Self-pay

## 2016-07-13 ENCOUNTER — Inpatient Hospital Stay (HOSPITAL_BASED_OUTPATIENT_CLINIC_OR_DEPARTMENT_OTHER)
Admission: EM | Admit: 2016-07-13 | Discharge: 2016-07-18 | DRG: 069 | Disposition: A | Discharge status: Home / Self Care | Payer: Medicare Other | Attending: Internal Medicine | Admitting: Internal Medicine

## 2016-07-13 DIAGNOSIS — I5043 Acute on chronic combined systolic (congestive) and diastolic (congestive) heart failure: Secondary | ICD-10-CM

## 2016-07-13 DIAGNOSIS — Z91018 Allergy to other foods: Secondary | ICD-10-CM

## 2016-07-13 DIAGNOSIS — Z91010 Allergy to peanuts: Secondary | ICD-10-CM | POA: Diagnosis not present

## 2016-07-13 DIAGNOSIS — D649 Anemia, unspecified: Secondary | ICD-10-CM | POA: Diagnosis present

## 2016-07-13 DIAGNOSIS — G459 Transient cerebral ischemic attack, unspecified: Secondary | ICD-10-CM | POA: Diagnosis present

## 2016-07-13 DIAGNOSIS — Z6841 Body Mass Index (BMI) 40.0 and over, adult: Secondary | ICD-10-CM | POA: Diagnosis not present

## 2016-07-13 DIAGNOSIS — Z85850 Personal history of malignant neoplasm of thyroid: Secondary | ICD-10-CM

## 2016-07-13 DIAGNOSIS — E7439 Other disorders of intestinal carbohydrate absorption: Secondary | ICD-10-CM | POA: Diagnosis present

## 2016-07-13 DIAGNOSIS — I639 Cerebral infarction, unspecified: Secondary | ICD-10-CM

## 2016-07-13 DIAGNOSIS — J449 Chronic obstructive pulmonary disease, unspecified: Secondary | ICD-10-CM | POA: Diagnosis present

## 2016-07-13 DIAGNOSIS — R29810 Facial weakness: Secondary | ICD-10-CM | POA: Diagnosis present

## 2016-07-13 DIAGNOSIS — Z9119 Patient's noncompliance with other medical treatment and regimen: Secondary | ICD-10-CM

## 2016-07-13 DIAGNOSIS — Z885 Allergy status to narcotic agent status: Secondary | ICD-10-CM

## 2016-07-13 DIAGNOSIS — E785 Hyperlipidemia, unspecified: Secondary | ICD-10-CM | POA: Diagnosis present

## 2016-07-13 DIAGNOSIS — Z951 Presence of aortocoronary bypass graft: Secondary | ICD-10-CM | POA: Diagnosis not present

## 2016-07-13 DIAGNOSIS — I2510 Atherosclerotic heart disease of native coronary artery without angina pectoris: Secondary | ICD-10-CM | POA: Diagnosis present

## 2016-07-13 DIAGNOSIS — I4892 Unspecified atrial flutter: Secondary | ICD-10-CM | POA: Diagnosis present

## 2016-07-13 DIAGNOSIS — I471 Supraventricular tachycardia: Secondary | ICD-10-CM | POA: Insufficient documentation

## 2016-07-13 DIAGNOSIS — R4781 Slurred speech: Secondary | ICD-10-CM | POA: Diagnosis present

## 2016-07-13 DIAGNOSIS — I110 Hypertensive heart disease with heart failure: Secondary | ICD-10-CM | POA: Diagnosis present

## 2016-07-13 DIAGNOSIS — I5040 Unspecified combined systolic (congestive) and diastolic (congestive) heart failure: Secondary | ICD-10-CM

## 2016-07-13 DIAGNOSIS — Z9981 Dependence on supplemental oxygen: Secondary | ICD-10-CM | POA: Diagnosis not present

## 2016-07-13 DIAGNOSIS — I5022 Chronic systolic (congestive) heart failure: Secondary | ICD-10-CM | POA: Diagnosis present

## 2016-07-13 DIAGNOSIS — Z8249 Family history of ischemic heart disease and other diseases of the circulatory system: Secondary | ICD-10-CM | POA: Diagnosis not present

## 2016-07-13 DIAGNOSIS — I5032 Chronic diastolic (congestive) heart failure: Secondary | ICD-10-CM | POA: Diagnosis present

## 2016-07-13 DIAGNOSIS — J9611 Chronic respiratory failure with hypoxia: Secondary | ICD-10-CM

## 2016-07-13 DIAGNOSIS — Z8673 Personal history of transient ischemic attack (TIA), and cerebral infarction without residual deficits: Secondary | ICD-10-CM

## 2016-07-13 DIAGNOSIS — I959 Hypotension, unspecified: Secondary | ICD-10-CM | POA: Diagnosis present

## 2016-07-13 DIAGNOSIS — Z22322 Carrier or suspected carrier of Methicillin resistant Staphylococcus aureus: Secondary | ICD-10-CM

## 2016-07-13 DIAGNOSIS — Z79899 Other long term (current) drug therapy: Secondary | ICD-10-CM

## 2016-07-13 DIAGNOSIS — E039 Hypothyroidism, unspecified: Secondary | ICD-10-CM | POA: Diagnosis present

## 2016-07-13 DIAGNOSIS — J439 Emphysema, unspecified: Secondary | ICD-10-CM | POA: Diagnosis not present

## 2016-07-13 DIAGNOSIS — I4891 Unspecified atrial fibrillation: Secondary | ICD-10-CM | POA: Diagnosis present

## 2016-07-13 DIAGNOSIS — I6330 Cerebral infarction due to thrombosis of unspecified cerebral artery: Secondary | ICD-10-CM | POA: Insufficient documentation

## 2016-07-13 DIAGNOSIS — F319 Bipolar disorder, unspecified: Secondary | ICD-10-CM | POA: Diagnosis present

## 2016-07-13 DIAGNOSIS — I340 Nonrheumatic mitral (valve) insufficiency: Secondary | ICD-10-CM | POA: Diagnosis not present

## 2016-07-13 DIAGNOSIS — I484 Atypical atrial flutter: Secondary | ICD-10-CM | POA: Diagnosis present

## 2016-07-13 DIAGNOSIS — I10 Essential (primary) hypertension: Secondary | ICD-10-CM | POA: Diagnosis not present

## 2016-07-13 DIAGNOSIS — E038 Other specified hypothyroidism: Secondary | ICD-10-CM | POA: Diagnosis not present

## 2016-07-13 DIAGNOSIS — R7989 Other specified abnormal findings of blood chemistry: Secondary | ICD-10-CM

## 2016-07-13 HISTORY — DX: Transient cerebral ischemic attack, unspecified: G45.9

## 2016-07-13 LAB — URINALYSIS, ROUTINE W REFLEX MICROSCOPIC
BILIRUBIN URINE: NEGATIVE
Glucose, UA: NEGATIVE mg/dL
Hgb urine dipstick: NEGATIVE
KETONES UR: NEGATIVE mg/dL
LEUKOCYTES UA: NEGATIVE
NITRITE: NEGATIVE
Protein, ur: NEGATIVE mg/dL
SPECIFIC GRAVITY, URINE: 1.007 (ref 1.005–1.030)
pH: 6.5 (ref 5.0–8.0)

## 2016-07-13 LAB — MAGNESIUM
MAGNESIUM: 1.8 mg/dL (ref 1.7–2.4)
MAGNESIUM: 1.9 mg/dL (ref 1.7–2.4)

## 2016-07-13 LAB — TROPONIN I
Troponin I: <0.03 ng/mL (ref <0.03)
Troponin I: <0.03 ng/mL (ref <0.03)

## 2016-07-13 LAB — HEPATIC FUNCTION PANEL
ALK PHOS: 49 U/L (ref 25–125)
ALT: 10 U/L (ref 7–35)
AST: 11 U/L — AB (ref 13–35)
Bilirubin, Total: 0.2 mg/dL

## 2016-07-13 LAB — CBC WITH DIFFERENTIAL/PLATELET
BASOS ABS: 0 10*3/uL (ref 0.0–0.1)
Basophils Relative: 0 %
EOS PCT: 6 %
Eosinophils Absolute: 0.4 10*3/uL (ref 0.0–0.7)
HEMATOCRIT: 30.8 % — AB (ref 36.0–46.0)
Hemoglobin: 9.1 g/dL — ABNORMAL LOW (ref 12.0–15.0)
LYMPHS ABS: 1.4 10*3/uL (ref 0.7–4.0)
LYMPHS PCT: 21 %
MCH: 25.3 pg — AB (ref 26.0–34.0)
MCHC: 29.5 g/dL — AB (ref 30.0–36.0)
MCV: 85.6 fL (ref 78.0–100.0)
MONO ABS: 0.9 10*3/uL (ref 0.1–1.0)
MONOS PCT: 14 %
NEUTROS ABS: 3.9 10*3/uL (ref 1.7–7.7)
Neutrophils Relative %: 59 %
PLATELETS: 298 10*3/uL (ref 150–400)
RBC: 3.6 MIL/uL — ABNORMAL LOW (ref 3.87–5.11)
RDW: 17.2 % — AB (ref 11.5–15.5)
WBC: 6.6 10*3/uL (ref 4.0–10.5)

## 2016-07-13 LAB — COMPREHENSIVE METABOLIC PANEL
ALT: 10 U/L — ABNORMAL LOW (ref 14–54)
AST: 11 U/L — AB (ref 15–41)
Albumin: 2.9 g/dL — ABNORMAL LOW (ref 3.5–5.0)
Alkaline Phosphatase: 49 U/L (ref 38–126)
Anion gap: 7 (ref 5–15)
BILIRUBIN TOTAL: 0.2 mg/dL — AB (ref 0.3–1.2)
BUN: 32 mg/dL — AB (ref 6–20)
CHLORIDE: 99 mmol/L — AB (ref 101–111)
CO2: 34 mmol/L — ABNORMAL HIGH (ref 22–32)
Calcium: 8.8 mg/dL — ABNORMAL LOW (ref 8.9–10.3)
Creatinine, Ser: 0.94 mg/dL (ref 0.44–1.00)
Glucose, Bld: 134 mg/dL — ABNORMAL HIGH (ref 65–99)
POTASSIUM: 4.3 mmol/L (ref 3.5–5.1)
Sodium: 140 mmol/L (ref 135–145)
TOTAL PROTEIN: 7.7 g/dL (ref 6.5–8.1)

## 2016-07-13 LAB — TSH
TSH: 0.63 u[IU]/mL (ref 0.350–4.500)
TSH: 0.535 u[IU]/mL (ref 0.350–4.500)

## 2016-07-13 LAB — BASIC METABOLIC PANEL
BUN: 32 mg/dL — AB (ref 4–21)
CREATININE: 0.9 mg/dL (ref 0.5–1.1)
GLUCOSE: 134 mg/dL
Potassium: 4.3 mmol/L (ref 3.4–5.3)
SODIUM: 140 mmol/L (ref 137–147)

## 2016-07-13 LAB — MRSA PCR SCREENING: MRSA by PCR: POSITIVE — AB

## 2016-07-13 LAB — CBC AND DIFFERENTIAL
HCT: 31 % — AB (ref 36–46)
Hemoglobin: 9.1 g/dL — AB (ref 12.0–16.0)
PLATELETS: 298 10*3/uL (ref 150–399)
WBC: 6.6 10^3/mL

## 2016-07-13 LAB — RAPID URINE DRUG SCREEN, HOSP PERFORMED
AMPHETAMINES: NOT DETECTED
Barbiturates: NOT DETECTED
Benzodiazepines: NOT DETECTED
Cocaine: NOT DETECTED
Opiates: NOT DETECTED
TETRAHYDROCANNABINOL: NOT DETECTED

## 2016-07-13 LAB — I-STAT CG4 LACTIC ACID, ED: Lactic Acid, Venous: 1.28 mmol/L (ref 0.5–1.9)

## 2016-07-13 LAB — BRAIN NATRIURETIC PEPTIDE: B NATRIURETIC PEPTIDE 5: 328.4 pg/mL — AB (ref 0.0–100.0)

## 2016-07-13 MED ORDER — ASPIRIN 325 MG PO TABS
325.0000 mg | ORAL_TABLET | Freq: Every day | ORAL | Status: DC
  Administered 2016-07-13 – 2016-07-17 (×5): 325 mg via ORAL
  Filled 2016-07-13 (×5): qty 1

## 2016-07-13 MED ORDER — HEPARIN (PORCINE) IN NACL 100-0.45 UNIT/ML-% IJ SOLN
1200.0000 [IU]/h | INTRAMUSCULAR | Status: DC
  Administered 2016-07-13 (×2): 1050 [IU]/h via INTRAVENOUS
  Filled 2016-07-13: qty 250

## 2016-07-13 MED ORDER — ASPIRIN 300 MG RE SUPP
300.0000 mg | Freq: Every day | RECTAL | Status: DC
  Filled 2016-07-13: qty 1

## 2016-07-13 MED ORDER — SERTRALINE HCL 50 MG PO TABS: 50.0000 mg | ORAL_TABLET | Freq: Every day | ORAL | Status: DC

## 2016-07-13 MED ORDER — ATORVASTATIN CALCIUM 20 MG PO TABS
20.0000 mg | ORAL_TABLET | Freq: Every day | ORAL | Status: DC
  Administered 2016-07-14 – 2016-07-17 (×4): 20 mg via ORAL
  Filled 2016-07-13 (×4): qty 1

## 2016-07-13 MED ORDER — AMIODARONE HCL IN DEXTROSE 360-4.14 MG/200ML-% IV SOLN
60.0000 mg/h | INTRAVENOUS | Status: AC
  Administered 2016-07-13 – 2016-07-14 (×2): 60 mg/h via INTRAVENOUS
  Filled 2016-07-13 (×2): qty 200

## 2016-07-13 MED ORDER — LEVOTHYROXINE SODIUM 25 MCG PO TABS
125.0000 ug | ORAL_TABLET | Freq: Every day | ORAL | Status: DC
  Administered 2016-07-14 – 2016-07-18 (×5): 125 ug via ORAL
  Filled 2016-07-13 (×5): qty 1

## 2016-07-13 MED ORDER — STROKE: EARLY STAGES OF RECOVERY BOOK
Freq: Once | Status: DC
  Filled 2016-07-13: qty 1

## 2016-07-13 MED ORDER — SODIUM CHLORIDE 0.9 % IV BOLUS (SEPSIS)
500.0000 mL | Freq: Once | INTRAVENOUS | Status: AC
  Administered 2016-07-13: 500 mL via INTRAVENOUS

## 2016-07-13 MED ORDER — OMEGA-3-ACID ETHYL ESTERS 1 G PO CAPS
1.0000 g | ORAL_CAPSULE | Freq: Every day | ORAL | Status: DC
  Administered 2016-07-14 – 2016-07-18 (×4): 1 g via ORAL
  Filled 2016-07-13 (×4): qty 1

## 2016-07-13 MED ORDER — AMIODARONE HCL IN DEXTROSE 360-4.14 MG/200ML-% IV SOLN
30.0000 mg/h | INTRAVENOUS | Status: DC
  Administered 2016-07-15 – 2016-07-17 (×5): 30 mg/h via INTRAVENOUS
  Filled 2016-07-13 (×6): qty 200

## 2016-07-13 MED ORDER — DILTIAZEM HCL 100 MG IV SOLR
5.0000 mg/h | INTRAVENOUS | Status: DC
  Administered 2016-07-13: 5 mg/h via INTRAVENOUS

## 2016-07-13 MED ORDER — DILTIAZEM HCL-DEXTROSE 100-5 MG/100ML-% IV SOLN (PREMIX)
INTRAVENOUS | Status: AC
  Filled 2016-07-13: qty 100

## 2016-07-13 MED ORDER — IOPAMIDOL (ISOVUE-300) INJECTION 61%: 100.0000 mL | Freq: Once | INTRAVENOUS | Status: DC | PRN

## 2016-07-13 MED ORDER — RISPERIDONE 1 MG PO TABS
1.0000 mg | ORAL_TABLET | Freq: Every day | ORAL | Status: DC
  Administered 2016-07-14 – 2016-07-17 (×5): 1 mg via ORAL
  Filled 2016-07-13 (×3): qty 2
  Filled 2016-07-13: qty 1
  Filled 2016-07-13: qty 2

## 2016-07-13 MED ORDER — DIVALPROEX SODIUM ER 500 MG PO TB24
1000.0000 mg | ORAL_TABLET | Freq: Two times a day (BID) | ORAL | Status: DC
  Administered 2016-07-13 – 2016-07-18 (×10): 1000 mg via ORAL
  Filled 2016-07-13 (×10): qty 2

## 2016-07-13 MED ORDER — ASPIRIN EC 325 MG PO TBEC
325.0000 mg | DELAYED_RELEASE_TABLET | Freq: Once | ORAL | Status: AC
  Administered 2016-07-13: 325 mg via ORAL
  Filled 2016-07-13: qty 1

## 2016-07-13 MED ORDER — GABAPENTIN 300 MG PO CAPS
600.0000 mg | ORAL_CAPSULE | Freq: Two times a day (BID) | ORAL | Status: DC
  Administered 2016-07-13 – 2016-07-18 (×10): 600 mg via ORAL
  Filled 2016-07-13 (×10): qty 2

## 2016-07-13 MED ORDER — GABAPENTIN 100 MG PO CAPS: 200.0000 mg | ORAL_CAPSULE | Freq: Every day | ORAL | Status: DC

## 2016-07-13 MED ORDER — SODIUM CHLORIDE 0.9 % IV SOLN
INTRAVENOUS | Status: DC
  Administered 2016-07-13 – 2016-07-15 (×2): via INTRAVENOUS

## 2016-07-13 MED ORDER — DILTIAZEM HCL 25 MG/5ML IV SOLN: 10.0000 mg | Freq: Once | INTRAVENOUS | Status: DC

## 2016-07-13 MED ORDER — SODIUM CHLORIDE 0.9 % IV SOLN
1.0000 g | Freq: Once | INTRAVENOUS | Status: AC
  Administered 2016-07-13: 1 g via INTRAVENOUS
  Filled 2016-07-13: qty 10

## 2016-07-13 MED ORDER — IOPAMIDOL (ISOVUE-370) INJECTION 76%
100.0000 mL | Freq: Once | INTRAVENOUS | Status: AC | PRN
  Administered 2016-07-13: 100 mL via INTRAVENOUS

## 2016-07-13 MED ORDER — FISH OIL 1000 MG PO CAPS: 1000.0000 mg | ORAL_CAPSULE | Freq: Every day | ORAL | Status: DC

## 2016-07-13 MED ORDER — DILTIAZEM LOAD VIA INFUSION
10.0000 mg | Freq: Once | INTRAVENOUS | Status: AC
  Administered 2016-07-13: 10 mg via INTRAVENOUS
  Filled 2016-07-13: qty 10

## 2016-07-13 NOTE — H&P (Signed)
History and Physical    Tonya Hogan [id number] DOB: October 11, 1951 DOA: 07/13/2016  PCP: No PCP Per Patient  Patient coming from: Home.  Chief Complaint: Slurred speech.  HPI: Tonya Hogan is a 65 y.o. female with CAD status post CABG, diastolic CHF, hypertension, bipolar disorder, COPD presents to the ER because of slurred speech. Patient has been having difficulty with speech since yesterday morning. Denies any weakness of the upper or lower extremity. Denies any visual symptoms or difficulty swallowing. While in the ER patient is found to be in atrial flutter with RVR. Patient was initially started on Cardizem infusion following which patient blood pressure started to drop. On my exam patient is still in atrial flutter with RVR. Patient still has slurring of the speech. CT of the head and CT angiogram of the chest was unremarkable.  ED Course: Was initially started on Cardizem infusion but has to be held due to hypotension.  Review of Systems: As per HPI, rest all negative.   Past Medical History:  Diagnosis Date  . Arthritis   . Bipolar 1 disorder ([place])   . CAD (coronary artery disease)   . CHF (congestive heart failure) ([place])   . Depression   . Hypertension   . Hypothyroidism   . Obesity   . S/P CABG x 3   . Thyroid ca ([place])   . Thyroid disease     Past Surgical History:  Procedure Laterality Date  . ABDOMINAL HYSTERECTOMY    . CORONARY ARTERY BYPASS GRAFT    . TONSILLECTOMY       reports that she has never smoked. She has never used smokeless tobacco. She reports that she does not drink alcohol or use drugs.  Allergies  Allergen Reactions  . Chocolate     Sneezing, coughing  . Peanut-Containing Drug Products     Sneezing,coughing  . Vicodin  [Hydrocodone-Acetaminophen] Rash    Family History  Problem Relation Age of Onset  . Hypertension Sister   . Bipolar disorder Other     Prior to Admission medications   Medication Sig Start Date End Date Taking?  Authorizing Provider  acetaminophen (RA ACETAMINOPHEN) 650 MG CR tablet Take 650 mg by mouth every 8 (eight) hours as needed. pain 03/16/13   Historical Provider, MD  albuterol (PROVENTIL HFA;VENTOLIN HFA) 108 (90 BASE) MCG/ACT inhaler Inhale 2 puffs into the lungs every 6 (six) hours as needed for wheezing or shortness of breath. 06/26/15   Donne Hazel, MD  furosemide (LASIX) 40 MG tablet Take 1 tablet (40 mg total) by mouth 2 (two) times daily. May take additional 40 mg in AM for weight 266 or greater 11/05/15   Amy D Clegg, NP  gabapentin (NEURONTIN) 100 MG capsule Take 200 mg by mouth at bedtime.    Historical Provider, MD  levothyroxine (SYNTHROID, LEVOTHROID) 125 MCG tablet Take 125 mcg by mouth daily before breakfast.    Historical Provider, MD  lisinopril (PRINIVIL,ZESTRIL) 20 MG tablet Take 1 tablet (20 mg total) by mouth daily. 01/08/16   Amy D Clegg, NP  miconazole (MICONAZOLE 7) 2 % vaginal cream Place 1 Applicatorful vaginally at bedtime.    Historical Provider, MD  naproxen sodium (ANAPROX) 220 MG tablet Take 440 mg by mouth 2 (two) times daily as needed (pain).    Historical Provider, MD  Omega-3 Fatty Acids (FISH OIL) 1000 MG CAPS Take 1,000 mg by mouth daily.    Historical Provider, MD  OXYGEN Inhale 2 L/min into the lungs as  needed.    Historical Provider, MD  potassium chloride SA (K-DUR,KLOR-CON) 20 MEQ tablet Take 1 tablet (20 mEq total) by mouth daily. 10/09/15   Geradine Girt, DO  sertraline (ZOLOFT) 50 MG tablet Take 50 mg by mouth daily.    Historical Provider, MD    Physical Exam: Vitals:   07/13/16 1700 07/13/16 1710 07/13/16 1845 07/13/16 1936  BP: 90/58 91/64 (!) 114/99 96/70  Pulse: 120 120 (!) 124 (!) 117  Resp: 15 16 (!) 25 19  Temp:    98.3 F (36.8 C)  TempSrc:    Oral  SpO2: 97% 99% 98% 96%  Weight:      Height:          Constitutional: Not in distress. Vitals:   07/13/16 1700 07/13/16 1710 07/13/16 1845 07/13/16 1936  BP: 90/58 91/64 (!) 114/99  96/70  Pulse: 120 120 (!) 124 (!) 117  Resp: 15 16 (!) 25 19  Temp:    98.3 F (36.8 C)  TempSrc:    Oral  SpO2: 97% 99% 98% 96%  Weight:      Height:       Eyes: Anicteric no pallor. ENMT: No discharge from the ears eyes nose or mouth. Neck: No mass felt. No neck rigidity. Respiratory: No rhonchi or crepitations. Cardiovascular: S1 and S2 heard. Abdomen: Soft nontender bowel sounds present. No guarding or rigidity. Musculoskeletal: No edema. Skin: No rash. Neurologic: Alert awake oriented to time place and person. Moves all extremities 5 x 5. Right facial droop which is chronic. Perla positive. Psychiatric: Appears normal.   Labs on Admission: I have personally reviewed following labs and imaging studies  CBC:  Recent Labs Lab 07/13/16 1335  WBC 6.6  NEUTROABS 3.9  HGB 9.1*  HCT 30.8*  MCV 85.6  PLT [id number]   Basic Metabolic Panel:  Recent Labs Lab 07/13/16 1335  NA 140  K 4.3  CL 99*  CO2 34*  GLUCOSE 134*  BUN 32*  CREATININE 0.94  CALCIUM 8.8*  MG 1.8   GFR: Estimated Creatinine Clearance: 71.1 mL/min (by C-G formula based on SCr of 0.94 mg/dL). Liver Function Tests:  Recent Labs Lab 07/13/16 1335  AST 11*  ALT 10*  ALKPHOS 49  BILITOT 0.2*  PROT 7.7  ALBUMIN 2.9*   No results for input(s): LIPASE, AMYLASE in the last 168 hours. No results for input(s): AMMONIA in the last 168 hours. Coagulation Profile: No results for input(s): INR, PROTIME in the last 168 hours. Cardiac Enzymes:  Recent Labs Lab 07/13/16 1335  TROPONINI <0.03   BNP (last 3 results) No results for input(s): PROBNP in the last 8760 hours. HbA1C: No results for input(s): HGBA1C in the last 72 hours. CBG: No results for input(s): GLUCAP in the last 168 hours. Lipid Profile: No results for input(s): CHOL, HDL, LDLCALC, TRIG, CHOLHDL, LDLDIRECT in the last 72 hours. Thyroid Function Tests:  Recent Labs  07/13/16 1335  TSH 0.630   Anemia Panel: No results for  input(s): VITAMINB12, FOLATE, FERRITIN, TIBC, IRON, RETICCTPCT in the last 72 hours. Urine analysis:    Component Value Date/Time   COLORURINE YELLOW 07/13/2016 1530   APPEARANCEUR CLEAR 07/13/2016 1530   LABSPEC 1.007 07/13/2016 1530   PHURINE 6.5 07/13/2016 1530   GLUCOSEU NEGATIVE 07/13/2016 1530   HGBUR NEGATIVE 07/13/2016 1530   BILIRUBINUR NEGATIVE 07/13/2016 [place] 07/13/2016 1530   PROTEINUR NEGATIVE 07/13/2016 1530   UROBILINOGEN 1.0 10/06/2015 2010   NITRITE NEGATIVE 07/13/2016  [place] 07/13/2016 1530   Sepsis Labs: @LABRCNTIP (procalcitonin:4,lacticidven:4) )No results found for this or any previous visit (from the past 240 hour(s)).   Radiological Exams on Admission: Dg Chest 2 View  Result Date: 07/13/2016 CLINICAL DATA:  Slurred speech. EXAM: CHEST  2 VIEW COMPARISON:  Radiographs of April 30, 2016. FINDINGS: Stable cardiomediastinal silhouette. Sternotomy wires are noted. No pneumothorax or pleural effusion is noted. Bony thorax is unremarkable. Slightly improved linear densities are noted in the perihilar regions consistent with improving subsegmental atelectasis. IMPRESSION: Mildly improved bilateral parahilar subsegmental atelectasis. Electronically Signed   By: Marijo Conception, M.D.   On: 07/13/2016 14:56   Ct Head Wo Contrast  Result Date: 07/13/2016 CLINICAL DATA:  Slurred speech beginning last night. EXAM: CT HEAD WITHOUT CONTRAST TECHNIQUE: Contiguous axial images were obtained from the base of the skull through the vertex without intravenous contrast. COMPARISON:  MRI 10/07/2015 FINDINGS: Brain: No acute intracranial abnormality. Specifically, no hemorrhage, hydrocephalus, mass lesion, acute infarction, or significant intracranial injury. Vascular: No hyperdense vessel or unexpected calcification. Skull: No acute calvarial abnormality Sinuses/Orbits: Visualized paranasal sinuses and mastoids clear. Orbital soft tissues  unremarkable. Other: None IMPRESSION: No acute intracranial abnormality. Electronically Signed   By: Rolm Baptise M.D.   On: 07/13/2016 14:45   Ct Angio Chest Pe W And/or Wo Contrast  Result Date: 07/13/2016 CLINICAL DATA:  Tachycardia, shoulder pain. EXAM: CT ANGIOGRAPHY CHEST WITH CONTRAST TECHNIQUE: Multidetector CT imaging of the chest was performed using the standard protocol during bolus administration of intravenous contrast. Multiplanar CT image reconstructions and MIPs were obtained to evaluate the vascular anatomy. CONTRAST:  100 mL of Isovue 370 intravenously. COMPARISON:  CT scan of June 22, 2015. FINDINGS: No pneumothorax or pleural effusion is noted. Mild bibasilar subsegmental atelectasis is noted. There is no evidence of large central pulmonary embolus. However, due to limitation in terms of timing of contrast bolus, smaller peripheral emboli in lower lobe branches cannot be excluded on the basis of this exam. Status post coronary artery bypass graft. Atherosclerosis of thoracic aorta is noted without aneurysm or dissection. Visualized portion of upper abdomen is unremarkable. No mediastinal mass or adenopathy is noted. Review of the MIP images confirms the above findings. IMPRESSION: Aortic atherosclerosis. Mild bibasilar subsegmental atelectasis. No evidence of large central pulmonary embolus. However, due to limitations of contrast bolus, smaller peripheral emboli in lower lobe branches cannot be excluded on the basis of this exam. Electronically Signed   By: Marijo Conception, M.D.   On: 07/13/2016 17:11    EKG: Independently reviewed. A flutter with RVR.  Assessment/Plan Principal Problem:   Atrial flutter with rapid ventricular response (HCC) Active Problems:   Essential hypertension   COPD (chronic obstructive pulmonary disease) (H[facility])   Facial droop   Hypothyroidism   S/P CABG x 3   Chronic diastolic heart failure ([facility])   Slurred speech    1. A flutter with RVR - this is  new onset for the patient. I did discuss with Dr. Aundra Dubin on-call cardiologist who recommended patient to be placed on amiodarone without bolus for patient is having low blood pressure. Patient is also started on heparin after discussing with neurologist. Note that patient did have some hemorrhoidal bleed last week. Check 2-D echo cycle cardiac markers check TSH. Reconsult cardiology in a.m. 2. Slurred speech - concerning for stroke. Patient does have right facial droop which is chronic. I did discuss with neurologist Dr. Nicole Kindred who will be seeing patient in  consult. Check MRI/MRA brain and carotid Doppler. 3. Hypertension - presently hypotensive so holding off antihypertensives. 4. COPD - not wheezing. 5. History of CAD status post CABG - continue aspirin and statins. 6. Chronic diastolic CHF - last EF measured was in August 2016 was 55-60%. Holding Lasix due to low blood pressure. 7. Hypothyroidism on Synthroid check TSH. 8. Chronic anemia - follow CBC.   DVT prophylaxis: Heparin infusion. Code Status: Full code.  Family Communication: Discussed with patient.  Disposition Plan: Home.  Consults called: Neurology. Discussed with cardiology.  Admission status: Inpatient. Likely stay 2-3 days.   Rise Patience MD [facility] Pager [phone].  If 7PM-7AM, please contact night-coverage [email or web address] Password [facility]  07/13/2016, 9:01 PM

## 2016-07-13 NOTE — ED Notes (Signed)
Patient transported to CT 

## 2016-07-13 NOTE — ED Notes (Signed)
MD at bedside. 

## 2016-07-13 NOTE — ED Notes (Signed)
Pt up to [facility] with assist x 1

## 2016-07-13 NOTE — ED Provider Notes (Addendum)
[place] DEPT [facility] Provider Note   CSN: [id number] Arrival date & time: 07/13/16  1308     History   Chief Complaint Chief Complaint  Patient presents with  . Aphasia    HPI Tonya Hogan is a 65 y.o. female.  HPI 65 year old female with complex past medical history including hypertension, hypothyroidism, diastolic CHF, coronary artery disease and bipolar disorder, status post recent hospitalization who presents with multiple complaints. Patient was just hospitalized at [facility] for acute mania. She was discharged last week. She states that since then she has had intermittent palpitations, shortness of breath and chest pain. Approximately 4 days ago she noticed that the right side of her face seemed asymmetric and was drooping. She also had some difficulty speaking. Her difficulty speaking has resolved but she has had persistent facial droop. She has also had palpitations and chest pain. She describes her chest pain as a dull pressure-like sensation without radiation. Denies any alleviating or aggravating factors. She has a history of coronary disease with history of similar chest pain. Patient also endorses pain in "every joint" of her body. This is chronic and not acutely changed.  Past Medical History:  Diagnosis Date  . Arthritis   . Bipolar 1 disorder ([place])   . CAD (coronary artery disease)   . CHF (congestive heart failure) ([place])   . Depression   . Hypertension   . Hypothyroidism   . Obesity   . S/P CABG x 3   . Thyroid ca ([place])   . Thyroid disease     Patient Active Problem List   Diagnosis Date Noted  . Chronic diastolic heart failure ([place]) 11/05/2015  . Facial droop 10/06/2015  . Acute on chronic diastolic heart failure ([place]) 10/06/2015  . Acute on chronic respiratory failure with hypoxia ([place]) 10/06/2015  . CHF exacerbation ([place]) 10/06/2015  . Hypothyroidism   . S/P CABG x 3   . CAD (coronary artery disease)   . Bipolar affective disorder, manic ([place])  07/08/2015  . COPD (chronic obstructive pulmonary disease) ([place]) 06/26/2015  . Acute pulmonary edema ([place]) 06/24/2015  . Essential hypertension 06/23/2015  . Chronic anemia 06/23/2015  . Post-operative hypothyroidism 06/23/2015  . Shortness of breath 06/23/2015  . Pulmonary edema 06/22/2015    Past Surgical History:  Procedure Laterality Date  . ABDOMINAL HYSTERECTOMY    . CORONARY ARTERY BYPASS GRAFT    . TONSILLECTOMY      OB History    No data available       Home Medications    Prior to Admission medications   Medication Sig Start Date End Date Taking? Authorizing Provider  acetaminophen (RA ACETAMINOPHEN) 650 MG CR tablet Take 650 mg by mouth every 8 (eight) hours as needed. pain 03/16/13   Historical Provider, MD  albuterol (PROVENTIL HFA;VENTOLIN HFA) 108 (90 BASE) MCG/ACT inhaler Inhale 2 puffs into the lungs every 6 (six) hours as needed for wheezing or shortness of breath. 06/26/15   Donne Hazel, MD  furosemide (LASIX) 40 MG tablet Take 1 tablet (40 mg total) by mouth 2 (two) times daily. May take additional 40 mg in AM for weight 266 or greater 11/05/15   Amy D Clegg, NP  gabapentin (NEURONTIN) 100 MG capsule Take 200 mg by mouth at bedtime.    Historical Provider, MD  levothyroxine (SYNTHROID, LEVOTHROID) 125 MCG tablet Take 125 mcg by mouth daily before breakfast.    Historical Provider, MD  lisinopril (PRINIVIL,ZESTRIL) 20 MG tablet Take 1 tablet (20 mg  total) by mouth daily. 01/08/16   Amy D Clegg, NP  miconazole (MICONAZOLE 7) 2 % vaginal cream Place 1 Applicatorful vaginally at bedtime.    Historical Provider, MD  naproxen sodium (ANAPROX) 220 MG tablet Take 440 mg by mouth 2 (two) times daily as needed (pain).    Historical Provider, MD  Omega-3 Fatty Acids (FISH OIL) 1000 MG CAPS Take 1,000 mg by mouth daily.    Historical Provider, MD  OXYGEN Inhale 2 L/min into the lungs as needed.    Historical Provider, MD  potassium chloride SA (K-DUR,KLOR-CON) 20 MEQ  tablet Take 1 tablet (20 mEq total) by mouth daily. 10/09/15   Geradine Girt, DO  sertraline (ZOLOFT) 50 MG tablet Take 50 mg by mouth daily.    Historical Provider, MD    Family History Family History  Problem Relation Age of Onset  . Hypertension Sister   . Bipolar disorder Other     Social History Social History  Substance Use Topics  . Smoking status: Never Smoker  . Smokeless tobacco: Never Used  . Alcohol use No     Allergies   Chocolate; Peanut-containing drug products; and Vicodin  [hydrocodone-acetaminophen]   Review of Systems Review of Systems  Constitutional: Positive for fatigue. Negative for chills and fever.  HENT: Negative for congestion, rhinorrhea and sore throat.   Eyes: Negative for visual disturbance.  Respiratory: Positive for chest tightness and shortness of breath. Negative for cough and wheezing.   Cardiovascular: Positive for chest pain, palpitations and leg swelling.  Gastrointestinal: Positive for nausea. Negative for abdominal pain, diarrhea and vomiting.  Genitourinary: Negative for dysuria, flank pain, vaginal bleeding and vaginal discharge.  Musculoskeletal: Positive for arthralgias and myalgias. Negative for neck pain.  Skin: Negative for rash.  Allergic/Immunologic: Negative for immunocompromised state.  Neurological: Negative for syncope and headaches.  Hematological: Does not bruise/bleed easily.  All other systems reviewed and are negative.    Physical Exam Updated Vital Signs BP 96/70   Pulse (!) 127   Temp 98.1 F (36.7 C) (Oral)   Resp 19   Ht 5' (1.524 m)   Wt 260 lb (117.9 kg)   SpO2 96%   BMI 50.78 kg/m   Physical Exam  Constitutional: She appears well-developed and well-nourished. No distress.  HENT:  Head: Normocephalic.  Mouth/Throat: Oropharynx is clear and moist.  Dry mucous membranes  Eyes: Conjunctivae are normal. Pupils are equal, round, and reactive to light.  Neck: Neck supple.  Cardiovascular:  Regular rhythm, normal heart sounds and intact distal pulses.  Tachycardia present.  Exam reveals no friction rub.   No murmur heard. Pulmonary/Chest: Effort normal and breath sounds normal. Tachypnea noted. No respiratory distress. She has no wheezes. She has no rales.  Abdominal: Soft. She exhibits no distension. There is no tenderness.  Musculoskeletal: She exhibits no edema.  Neurological: She is alert. She exhibits normal muscle tone.  Skin: Skin is warm. Capillary refill takes less than 2 seconds. No rash noted.  Nursing note and vitals reviewed.   Neurological Exam:  Mental Status: Alert and oriented to person, place, and time. Attention and concentration normal. Speech clear. Recent memory is intact. Cranial Nerves: Visual fields intact to confrontation in all quadrants bilaterally. EOMI and PERRLA. No nystagmus noted. Facial sensation intact at forehead, maxillary cheek, and chin/mandible bilaterally. No weakness of masticatory muscles. Mild flattening of right NLF with mild droop of smile. Hearing grossly normal to finer rub. Uvula is midline, and palate elevates symmetrically. Normal  SCM and trapezius strength. Tongue midline without fasciculations Motor: Muscle strength 5/5 in proximal and distal UE and LE bilaterally. No pronator drift. Muscle tone normal. Reflexes: 2+ and symmetrical in all four extremities.  Sensation: Intact to light touch in upper and lower extremities distally bilaterally.  Gait: Deferred Coordination: Normal FTN bilaterally.   ED Treatments / Results  Labs (all labs ordered are listed, but only abnormal results are displayed) Labs Reviewed  CBC WITH DIFFERENTIAL/PLATELET - Abnormal; Notable for the following:       Result Value   RBC 3.60 (*)    Hemoglobin 9.1 (*)    HCT 30.8 (*)    MCH 25.3 (*)    MCHC 29.5 (*)    RDW 17.2 (*)    All other components within normal limits  COMPREHENSIVE METABOLIC PANEL - Abnormal; Notable for the following:     Chloride 99 (*)    CO2 34 (*)    Glucose, Bld 134 (*)    BUN 32 (*)    Calcium 8.8 (*)    Albumin 2.9 (*)    AST 11 (*)    ALT 10 (*)    Total Bilirubin 0.2 (*)    All other components within normal limits  BRAIN NATRIURETIC PEPTIDE - Abnormal; Notable for the following:    B Natriuretic Peptide 328.4 (*)    All other components within normal limits  TROPONIN I  MAGNESIUM  TSH  URINE RAPID DRUG SCREEN, HOSP PERFORMED  URINALYSIS, ROUTINE W REFLEX MICROSCOPIC (NOT AT [facility])    EKG  EKG Interpretation  Date/Time:  Tuesday July 13 2016 13:26:06 EDT Ventricular Rate:  135 PR Interval:    QRS Duration: 78 QT Interval:  256 QTC Calculation: 372 R Axis:   38 Text Interpretation:  Atrial flutter Ventricular premature complex RSR' in V1 or V2, right VCD or RVH Repol abnrm suggests ischemia, diffuse leads Since previous EKG: Supraventricular tachycardia, likely aflutter, is new Non-specific ST changes likely demand No ST elevations Confirmed by Ellender Hose MD, Lysbeth Galas [phone]) on 07/13/2016 1:58:57 PM       Radiology Dg Chest 2 View  Result Date: 07/13/2016 CLINICAL DATA:  Slurred speech. EXAM: CHEST  2 VIEW COMPARISON:  Radiographs of April 30, 2016. FINDINGS: Stable cardiomediastinal silhouette. Sternotomy wires are noted. No pneumothorax or pleural effusion is noted. Bony thorax is unremarkable. Slightly improved linear densities are noted in the perihilar regions consistent with improving subsegmental atelectasis. IMPRESSION: Mildly improved bilateral parahilar subsegmental atelectasis. Electronically Signed   By: Marijo Conception, M.D.   On: 07/13/2016 14:56   Ct Head Wo Contrast  Result Date: 07/13/2016 CLINICAL DATA:  Slurred speech beginning last night. EXAM: CT HEAD WITHOUT CONTRAST TECHNIQUE: Contiguous axial images were obtained from the base of the skull through the vertex without intravenous contrast. COMPARISON:  MRI 10/07/2015 FINDINGS: Brain: No acute intracranial abnormality.  Specifically, no hemorrhage, hydrocephalus, mass lesion, acute infarction, or significant intracranial injury. Vascular: No hyperdense vessel or unexpected calcification. Skull: No acute calvarial abnormality Sinuses/Orbits: Visualized paranasal sinuses and mastoids clear. Orbital soft tissues unremarkable. Other: None IMPRESSION: No acute intracranial abnormality. Electronically Signed   By: Rolm Baptise M.D.   On: 07/13/2016 14:45    Procedures .Critical Care Performed by: Duffy Bruce Authorized by: Duffy Bruce   Critical care provider statement:    Critical care time (minutes):  35   Critical care time was exclusive of:  Separately billable procedures and treating other patients   Critical care was necessary  to treat or prevent imminent or life-threatening deterioration of the following conditions:  Circulatory failure, respiratory failure and cardiac failure   Critical care was time spent personally by me on the following activities:  Blood draw for specimens, ordering and performing treatments and interventions, ordering and review of laboratory studies, ordering and review of radiographic studies, pulse oximetry, ventilator management, obtaining history from patient or surrogate, development of treatment plan with patient or surrogate, discussions with primary provider, discussions with consultants, evaluation of patient's response to treatment and examination of patient   I assumed direction of critical care for this patient from another provider in my specialty: no     (including critical care time)  Medications Ordered in ED Medications  diltiazem (CARDIZEM) 1 mg/mL load via infusion 10 mg (10 mg Intravenous Bolus from Bag 07/13/16 1342)    And  diltiazem (CARDIZEM) 100 mg in dextrose 5 % 100 mL (1 mg/mL) infusion (10 mg/hr Intravenous Rate/Dose Change 07/13/16 1450)  dilTIAZem HCl-Dextrose 100-5 MG/100ML-% infusion SOLN (not administered)  diltiazem (CARDIZEM) injection 10 mg  (not administered)  sodium chloride 0.9 % bolus 500 mL (500 mLs Intravenous New Bag/Given 07/13/16 1527)  aspirin EC tablet 325 mg (325 mg Oral Given 07/13/16 1507)  calcium gluconate 1 g in sodium chloride 0.9 % 100 mL IVPB (1 g Intravenous New Bag/Given 07/13/16 1513)     Initial Impression / Assessment and Plan / ED Course  I have reviewed the triage vital signs and the nursing notes.  Pertinent labs & imaging results that were available during my care of the patient were reviewed by me and considered in my medical decision making (see chart for details).  Clinical Course  65 year old female with past medical history of hypertension, hypothyroidism, diastolic CHF, coronary artery disease and bipolar disorder who presents with multiple complaints. On arrival, patient is tachycardic and mildly hypotensive. She is on her baseline oxygen. On exam, she is overall very well-appearing. She does have a mild right facial droop but otherwise nonfocal exam.   Initial differential is broad. Regarding her facial droop I'm concerned for possible CVA. Last known normal was 4 days ago. She has no other neurological deficits. She is not on blood thinners. Must also consider possible reactivation versus hypoperfusion secondary to primary cardiac arrhythmia. EKG is concerning for atrial flutter. Diltiazem bolus initially given with improvement to the 120s but persistent flutter versus sinus tachycardia. Diltiazem drip started. Due to persistent tachycardia she is mildly hypertensive but alert, oriented, and otherwise stable at this time. Otherwise, will send broad labs, stat CT head and chest x-ray and plan for admission  Labs and imaging reviewed as above. CBC shows normal white count and baseline anemia. CMP shows mild elevation in BUN but baseline elevated bicarbonate, likely secondary to chronic COPD. BNP is elevated at baseline. Troponin is negative. Urine drug screen is negative. Chest x-ray shows improving  atelectasis. CT head is negative. Patient remains persistently tachycardic and mildly hypotensive. However, she is mentating well with no signs of shock. I discussed the patient's repeat EKG with cardiology. They feel the patient's EKG is representative of atrial flutter. Will continue diltiazem, and give her a fluid bolus as she appears mildly clinically dehydrated on exam. Despite elevated BNP. Patient has a normal EF on recent echo. Otherwise, will give calcium to help combat peripheral vasodilation.  Patient remains tachycardic. Blood pressure remains low baseline but with maps > 65. Given duration of symptoms, I'm hesitant to shock. Patient at this time and  she shows no signs of severe hemodynamic instability. Given her persistent tachycardia and elevated BNP, will obtain CTA PE. Discussed case with hospitalist. Will transfer to [facility] stepdown unit for further management. Will hold on heparin at this time in setting of possible recent stroke.   Final Clinical Impressions(s) / ED Diagnoses   Final diagnoses:  Atypical atrial flutter (HCC)  Facial droop  Hypotension, unspecified hypotension type  Elevated brain natriuretic peptide (BNP) level  Chronic respiratory failure with hypoxia (HCC)      Duffy Bruce, MD 07/13/16 [place], MD 07/13/16 1715

## 2016-07-13 NOTE — Progress Notes (Signed)
65 year old female with a history of CHF, CAD status post CABG, hypertension, presented with 4 day history of right facial droop in addition to one week history of chest pain or shortness of breath. The patient was noted to be in  [place] with heart rate 1:30-140. The patient was given a diltiazem bolus and started on diltiazem drip with heart rates back into the 120s. Unfortunately, the patient's blood pressure has become quite soft into the low 90s and upper 80s. BMP was unremarkable. LFTs are unremarkable. CBC showed hemoglobin 9.1 which is near baseline. Chest x-ray showed bibasilar atelectasis. CT brain was negative. The patient was accepted for stepdown bed for stroke workup and control of her atrial flutter. Repeat EKG was discussed with cardiology who felt the patient remained in atrial flutter. Formal consult was not obtained with cardiology, but please call if formal consult as necessary.. Current diltiazem drip at 10 mg per hour. The patient has chronic respiratory failure and is maintained on 2 L nasal cannula at home or at baseline.  DTat

## 2016-07-13 NOTE — Progress Notes (Signed)
ANTICOAGULATION CONSULT NOTE - Initial Consult  Pharmacy Consult for heparin  Indication: atrial fibrillation  Allergies  Allergen Reactions  . Chocolate     Sneezing, coughing  . Peanut-Containing Drug Products     Sneezing,coughing  . Vicodin  [Hydrocodone-Acetaminophen] Rash    Patient Measurements: Height: 5' (152.4 cm) Weight: 260 lb (117.9 kg) IBW/kg (Calculated) : 45.5 Heparin Dosing Weight: 75kg  Vital Signs: Temp: 98.3 F (36.8 C) (08/22 1936) Temp Source: Oral (08/22 1936) BP: 96/70 (08/22 1936) Pulse Rate: 117 (08/22 1936)  Labs:  Recent Labs  07/13/16 1335  HGB 9.1*  HCT 30.8*  PLT 298  CREATININE 0.94  TROPONINI <0.03    Estimated Creatinine Clearance: 71.1 mL/min (by C-G formula based on SCr of 0.94 mg/dL).   Medical History: Past Medical History:  Diagnosis Date  . Arthritis   . Bipolar 1 disorder ([place])   . CAD (coronary artery disease)   . CHF (congestive heart failure) ([place])   . Depression   . Hypertension   . Hypothyroidism   . Obesity   . S/P CABG x 3   . Thyroid ca ([place])   . Thyroid disease     Medications:  Prescriptions Prior to Admission  Medication Sig Dispense Refill Last Dose  . acetaminophen (RA ACETAMINOPHEN) 650 MG CR tablet Take 650 mg by mouth every 8 (eight) hours as needed. pain   06/30/2016 at 1100  . albuterol (PROVENTIL HFA;VENTOLIN HFA) 108 (90 BASE) MCG/ACT inhaler Inhale 2 puffs into the lungs every 6 (six) hours as needed for wheezing or shortness of breath. 1 Inhaler 0 Taking  . furosemide (LASIX) 40 MG tablet Take 1 tablet (40 mg total) by mouth 2 (two) times daily. May take additional 40 mg in AM for weight 266 or greater 75 tablet 1 06/30/2016 at 11:00  . gabapentin (NEURONTIN) 100 MG capsule Take 200 mg by mouth at bedtime.   06/29/2016 at 2200  . levothyroxine (SYNTHROID, LEVOTHROID) 125 MCG tablet Take 125 mcg by mouth daily before breakfast.   06/30/2016 at 1100  . lisinopril (PRINIVIL,ZESTRIL) 20 MG tablet Take  1 tablet (20 mg total) by mouth daily. 30 tablet 3 06/30/2016 at 11:00  . miconazole (MICONAZOLE 7) 2 % vaginal cream Place 1 Applicatorful vaginally at bedtime.   Taking  . naproxen sodium (ANAPROX) 220 MG tablet Take 440 mg by mouth 2 (two) times daily as needed (pain).   Taking  . Omega-3 Fatty Acids (FISH OIL) 1000 MG CAPS Take 1,000 mg by mouth daily.   06/30/2016 at 1100  . OXYGEN Inhale 2 L/min into the lungs as needed.   06/29/2016 at night  . potassium chloride SA (K-DUR,KLOR-CON) 20 MEQ tablet Take 1 tablet (20 mEq total) by mouth daily. 30 tablet 0 Taking  . sertraline (ZOLOFT) 50 MG tablet Take 50 mg by mouth daily.   06/30/2016 at 1100   Scheduled:  .  stroke: mapping our early stages of recovery book   Does not apply Once  . aspirin  300 mg Rectal Daily   Or  . aspirin  325 mg Oral Daily  . dilTIAZem HCl-Dextrose      . Fish Oil  1,000 mg Oral Daily  . gabapentin  200 mg Oral QHS  . [START ON 07/14/2016] levothyroxine  125 mcg Oral QAC breakfast  . sertraline  50 mg Oral Daily    Assessment: 65 yo female with aflutter (CHADSVASC=4) and also concern for CVA (CT negative) and MRA pending. [facility]  consulted to dose heparin -hg= 9.1 (noted 9.9 on 8/9), plt= 298    Goal of Therapy:  Heparin level= 0.3-0.5 Monitor platelets by anticoagulation protocol: Yes   Plan:  -No heparin bolus with concern for possible CVA -Begin heparin at 1050 units/hr (~ 14 units/kg/hr) -Heparin level in 8 hours and daily wth CBC daily  Hildred Laser, Pharm D 07/13/2016 9:18 PM

## 2016-07-13 NOTE — Consult Note (Signed)
Admission H&P    Chief Complaint: Facial droop and slurred speech.  HPI: Tonya Hogan is an 65 y.o. female with a history of hypertension, CHF, CAD with CABG and bipolar disorder, presenting with chest discomfort, as well as history of right facial droop and slurred speech which started 4 days ago. She was noted to have atrial flutter in the ED. CT scan of her head showed no acute intracranial abnormality. She has not been on antiplatelet therapy. No weakness of extremities was reported. Patient indicates she has experienced a previous stroke, from which she recovered well. NIH stroke score at the time of this evaluation was 0.  LSN: 07/09/2016 tPA Given: No: Deficits apparently resolved mRankin:  Past Medical History:  Diagnosis Date  . Arthritis   . Bipolar 1 disorder ([place])   . CAD (coronary artery disease)   . CHF (congestive heart failure) ([place])   . Depression   . Hypertension   . Hypothyroidism   . Obesity   . S/P CABG x 3   . Thyroid ca ([place])   . Thyroid disease     Past Surgical History:  Procedure Laterality Date  . ABDOMINAL HYSTERECTOMY    . CORONARY ARTERY BYPASS GRAFT    . TONSILLECTOMY      Family History  Problem Relation Age of Onset  . Hypertension Sister   . Bipolar disorder Other    Social History:  reports that she has never smoked. She has never used smokeless tobacco. She reports that she does not drink alcohol or use drugs.  Allergies:  Allergies  Allergen Reactions  . Methimazole Hives and Rash  . Propylthiouracil Hives  . Chocolate     Sneezing, coughing  . Other Other (See Comments)    Corn, cabbage, greens = Rectal bleeding  . Peanut-Containing Drug Products     Sneezing,coughing  . Penicillins Itching and Cough    Sneezing Has patient had a PCN reaction causing immediate rash, facial/tongue/throat swelling, SOB or lightheadedness with hypotension: Yes Has patient had a PCN reaction causing severe rash involving mucus membranes or skin  necrosis: Unknown Has patient had a PCN reaction that required hospitalization: Unknown Has patient had a PCN reaction occurring within the last 10 years: No If all of the above answers are "NO", then may proceed with Cephalosporin use.   . Vicodin [Hydrocodone-Acetaminophen] Rash    Medications Prior to Admission  Medication Sig Dispense Refill  . acetaminophen (RA ACETAMINOPHEN) 650 MG CR tablet Take 650 mg by mouth every 8 (eight) hours as needed. pain    . albuterol (PROVENTIL HFA;VENTOLIN HFA) 108 (90 BASE) MCG/ACT inhaler Inhale 2 puffs into the lungs every 6 (six) hours as needed for wheezing or shortness of breath. 1 Inhaler 0  . amLODipine (NORVASC) 5 MG tablet Take 5 mg by mouth daily.    Marland Kitchen atorvastatin (LIPITOR) 20 MG tablet Take 20 mg by mouth at bedtime.    . bisacodyl (DULCOLAX) 5 MG EC tablet Take 5 mg by mouth 2 (two) times daily as needed for mild constipation.    . divalproex (DEPAKOTE ER) 500 MG 24 hr tablet Take 1,000 mg by mouth 2 (two) times daily.    . ferrous sulfate 325 (65 FE) MG tablet Take 325 mg by mouth daily with breakfast.    . furosemide (LASIX) 40 MG tablet Take 1 tablet (40 mg total) by mouth 2 (two) times daily. May take additional 40 mg in AM for weight 266 or greater (Patient taking differently:  Take 40 mg by mouth every morning. May take additional 40 mg in AM for weight 266 or greater) 75 tablet 1  . gabapentin (NEURONTIN) 600 MG tablet Take 600 mg by mouth 2 (two) times daily.    Marland Kitchen levothyroxine (SYNTHROID, LEVOTHROID) 125 MCG tablet Take 125 mcg by mouth daily before breakfast.    . lisinopril-hydrochlorothiazide (PRINZIDE,ZESTORETIC) 20-12.5 MG tablet Take 1 tablet by mouth daily.    . metoprolol tartrate (LOPRESSOR) 25 MG tablet Take 25 mg by mouth 2 (two) times daily.    . Omega-3 Fatty Acids (FISH OIL) 1000 MG CAPS Take 1,000 mg by mouth daily.    . OXYGEN Inhale 2 L/min into the lungs as needed.    . polyethylene glycol (MIRALAX / GLYCOLAX)  packet Take 17 g by mouth daily as needed for mild constipation.     . potassium chloride SA (K-DUR,KLOR-CON) 20 MEQ tablet Take 1 tablet (20 mEq total) by mouth daily. 30 tablet 0  . risperiDONE (RISPERDAL) 2 MG tablet Take 2 mg by mouth at bedtime.    Marland Kitchen zolpidem (AMBIEN) 5 MG tablet Take 5 mg by mouth at bedtime.    . miconazole (MICONAZOLE 7) 2 % vaginal cream Place 1 Applicatorful vaginally at bedtime.      ROS: History obtained from chart review and the patient  General ROS: negative for - chills, fatigue, fever, night sweats, weight gain or weight loss Psychological ROS: Chronic bipolar disorder Ophthalmic ROS: negative for - blurry vision, double vision, eye pain or loss of vision ENT ROS: negative for - epistaxis, nasal discharge, oral lesions, sore throat, tinnitus or vertigo Allergy and Immunology ROS: negative for - hives or itchy/watery eyes Hematological and Lymphatic ROS: negative for - bleeding problems, bruising or swollen lymph nodes Endocrine ROS: negative for - galactorrhea, hair pattern changes, polydipsia/polyuria or temperature intolerance Respiratory ROS: negative for - cough, hemoptysis, shortness of breath or wheezing Cardiovascular ROS: As noted in present illness Gastrointestinal ROS: negative for - abdominal pain, diarrhea, hematemesis, nausea/vomiting or stool incontinence Genito-Urinary ROS: negative for - dysuria, hematuria, incontinence or urinary frequency/urgency Musculoskeletal ROS: negative for - joint swelling or muscular weakness Neurological ROS: as noted in HPI Dermatological ROS: negative for rash and skin lesion changes  Physical Examination: Blood pressure 111/78, pulse (!) 119, temperature 98.8 F (37.1 C), temperature source Oral, resp. rate (!) 22, height 5' (1.524 m), weight 117.9 kg (260 lb), SpO2 97 %.  HEENT-  Normocephalic, no lesions, without obvious abnormality.  Normal external eye and conjunctiva.  Normal TM's bilaterally.  Normal  auditory canals and external ears. Normal external nose, mucus membranes and septum.  Normal pharynx. Neck supple with no masses, nodes, nodules or enlargement. Cardiovascular - tachycardiac, regular rhythm, normal S1 and S2, murmur or gallop Lungs - chest clear, no wheezing, rales, normal symmetric air entry Abdomen - soft, non-tender; bowel sounds normal; no masses,  no organomegaly Extremities - no joint deformities, effusion, or inflammation  Neurologic Examination: Mental Status: Alert, oriented, no acute distress.  Speech fluent without evidence of aphasia. Able to follow commands without difficulty. Cranial Nerves: II-Visual fields were normal. III/IV/VI-Pupils were equal and reacted normally to light. Extraocular movements were full and conjugate.    V/VII-no facial numbness and no facial weakness. VIII-normal. X-normal speech and symmetrical palatal movement. XI: trapezius strength/neck flexion strength normal bilaterally XII-midline tongue extension with normal strength. Motor: 5/5 bilaterally with normal tone and bulk Sensory: Normal throughout. Deep Tendon Reflexes: Trace to 1+ and symmetric. Plantars:  Mute bilaterally Cerebellar: Normal finger-to-nose testing. Carotid auscultation: Normal  Results for orders placed or performed during the hospital encounter of 07/13/16 (from the past 48 hour(s))  CBC with Differential     Status: Abnormal   Collection Time: 07/13/16  1:35 PM  Result Value Ref Range   WBC 6.6 4.0 - 10.5 K/uL   RBC 3.60 (L) 3.87 - 5.11 MIL/uL   Hemoglobin 9.1 (L) 12.0 - 15.0 g/dL   HCT 30.8 (L) 36.0 - 46.0 %   MCV 85.6 78.0 - 100.0 fL   MCH 25.3 (L) 26.0 - 34.0 pg   MCHC 29.5 (L) 30.0 - 36.0 g/dL   RDW 17.2 (H) 11.5 - 15.5 %   Platelets 298 150 - 400 K/uL   Neutrophils Relative % 59 %   Neutro Abs 3.9 1.7 - 7.7 K/uL   Lymphocytes Relative 21 %   Lymphs Abs 1.4 0.7 - 4.0 K/uL   Monocytes Relative 14 %   Monocytes Absolute 0.9 0.1 - 1.0 K/uL    Eosinophils Relative 6 %   Eosinophils Absolute 0.4 0.0 - 0.7 K/uL   Basophils Relative 0 %   Basophils Absolute 0.0 0.0 - 0.1 K/uL  Comprehensive metabolic panel     Status: Abnormal   Collection Time: 07/13/16  1:35 PM  Result Value Ref Range   Sodium 140 135 - 145 mmol/L   Potassium 4.3 3.5 - 5.1 mmol/L   Chloride 99 (L) 101 - 111 mmol/L   CO2 34 (H) 22 - 32 mmol/L   Glucose, Bld 134 (H) 65 - 99 mg/dL   BUN 32 (H) 6 - 20 mg/dL   Creatinine, Ser 0.94 0.44 - 1.00 mg/dL   Calcium 8.8 (L) 8.9 - 10.3 mg/dL   Total Protein 7.7 6.5 - 8.1 g/dL   Albumin 2.9 (L) 3.5 - 5.0 g/dL   AST 11 (L) 15 - 41 U/L   ALT 10 (L) 14 - 54 U/L   Alkaline Phosphatase 49 38 - 126 U/L   Total Bilirubin 0.2 (L) 0.3 - 1.2 mg/dL   GFR calc non Af Amer >60 >60 mL/min   GFR calc Af Amer >60 >60 mL/min    Comment: (NOTE) The eGFR has been calculated using the CKD EPI equation. This calculation has not been validated in all clinical situations. eGFR's persistently <60 mL/min signify possible Chronic Kidney Disease.    Anion gap 7 5 - 15  TSH     Status: None   Collection Time: 07/13/16  1:35 PM  Result Value Ref Range   TSH 0.630 0.350 - 4.500 uIU/mL    Comment: Performed at [facility]  Brain natriuretic peptide     Status: Abnormal   Collection Time: 07/13/16  1:35 PM  Result Value Ref Range   B Natriuretic Peptide 328.4 (H) 0.0 - 100.0 pg/mL  Troponin I     Status: None   Collection Time: 07/13/16  1:35 PM  Result Value Ref Range   Troponin I <0.03 <0.03 ng/mL  Magnesium     Status: None   Collection Time: 07/13/16  1:35 PM  Result Value Ref Range   Magnesium 1.8 1.7 - 2.4 mg/dL  Rapid urine drug screen (hospital performed)     Status: None   Collection Time: 07/13/16  3:30 PM  Result Value Ref Range   Opiates NONE DETECTED NONE DETECTED   Cocaine NONE DETECTED NONE DETECTED   Benzodiazepines NONE DETECTED NONE DETECTED   Amphetamines NONE DETECTED NONE DETECTED  Tetrahydrocannabinol  NONE DETECTED NONE DETECTED   Barbiturates NONE DETECTED NONE DETECTED    Comment:        DRUG SCREEN FOR MEDICAL PURPOSES ONLY.  IF CONFIRMATION IS NEEDED FOR ANY PURPOSE, NOTIFY LAB WITHIN 5 DAYS.        LOWEST DETECTABLE LIMITS FOR URINE DRUG SCREEN Drug Class       Cutoff (ng/mL) Amphetamine      1000 Barbiturate      200 Benzodiazepine   474 Tricyclics       259 Opiates          300 Cocaine          300 THC              50   Urinalysis, Routine w reflex microscopic (not at [facility])     Status: None   Collection Time: 07/13/16  3:30 PM  Result Value Ref Range   Color, Urine YELLOW YELLOW   APPearance CLEAR CLEAR   Specific Gravity, Urine 1.007 1.005 - 1.030   pH 6.5 5.0 - 8.0   Glucose, UA NEGATIVE NEGATIVE mg/dL   Hgb urine dipstick NEGATIVE NEGATIVE   Bilirubin Urine NEGATIVE NEGATIVE   Ketones, ur NEGATIVE NEGATIVE mg/dL   Protein, ur NEGATIVE NEGATIVE mg/dL   Nitrite NEGATIVE NEGATIVE   Leukocytes, UA NEGATIVE NEGATIVE    Comment: MICROSCOPIC NOT DONE ON URINES WITH NEGATIVE PROTEIN, BLOOD, LEUKOCYTES, NITRITE, OR GLUCOSE <1000 mg/dL.  I-Stat CG4 Lactic Acid, ED     Status: None   Collection Time: 07/13/16  5:33 PM  Result Value Ref Range   Lactic Acid, Venous 1.28 0.5 - 1.9 mmol/L  MRSA PCR Screening     Status: Abnormal   Collection Time: 07/13/16  6:46 PM  Result Value Ref Range   MRSA by PCR POSITIVE (A) NEGATIVE    Comment:        The GeneXpert MRSA Assay (FDA approved for NASAL specimens only), is one component of a comprehensive MRSA colonization surveillance program. It is not intended to diagnose MRSA infection nor to guide or monitor treatment for MRSA infections. RESULT CALLED TO, READ BACK BY AND VERIFIED WITH: T IRBY RN [id number] 07/13/16 A BROWNING   TSH     Status: None   Collection Time: 07/13/16  9:59 PM  Result Value Ref Range   TSH 0.535 0.350 - 4.500 uIU/mL  Troponin I (q 6hr x 3)     Status: None   Collection Time: 07/13/16 10:06 PM   Result Value Ref Range   Troponin I <0.03 <0.03 ng/mL  Magnesium     Status: None   Collection Time: 07/13/16 10:06 PM  Result Value Ref Range   Magnesium 1.9 1.7 - 2.4 mg/dL   Dg Chest 2 View  Result Date: 07/13/2016 CLINICAL DATA:  Slurred speech. EXAM: CHEST  2 VIEW COMPARISON:  Radiographs of April 30, 2016. FINDINGS: Stable cardiomediastinal silhouette. Sternotomy wires are noted. No pneumothorax or pleural effusion is noted. Bony thorax is unremarkable. Slightly improved linear densities are noted in the perihilar regions consistent with improving subsegmental atelectasis. IMPRESSION: Mildly improved bilateral parahilar subsegmental atelectasis. Electronically Signed   By: Marijo Conception, M.D.   On: 07/13/2016 14:56   Ct Head Wo Contrast  Result Date: 07/13/2016 CLINICAL DATA:  Slurred speech beginning last night. EXAM: CT HEAD WITHOUT CONTRAST TECHNIQUE: Contiguous axial images were obtained from the base of the skull through the vertex without intravenous contrast. COMPARISON:  MRI 10/07/2015 FINDINGS:  Brain: No acute intracranial abnormality. Specifically, no hemorrhage, hydrocephalus, mass lesion, acute infarction, or significant intracranial injury. Vascular: No hyperdense vessel or unexpected calcification. Skull: No acute calvarial abnormality Sinuses/Orbits: Visualized paranasal sinuses and mastoids clear. Orbital soft tissues unremarkable. Other: None IMPRESSION: No acute intracranial abnormality. Electronically Signed   By: Rolm Baptise M.D.   On: 07/13/2016 14:45   Ct Angio Chest Pe W And/or Wo Contrast  Result Date: 07/13/2016 CLINICAL DATA:  Tachycardia, shoulder pain. EXAM: CT ANGIOGRAPHY CHEST WITH CONTRAST TECHNIQUE: Multidetector CT imaging of the chest was performed using the standard protocol during bolus administration of intravenous contrast. Multiplanar CT image reconstructions and MIPs were obtained to evaluate the vascular anatomy. CONTRAST:  100 mL of Isovue 370  intravenously. COMPARISON:  CT scan of June 22, 2015. FINDINGS: No pneumothorax or pleural effusion is noted. Mild bibasilar subsegmental atelectasis is noted. There is no evidence of large central pulmonary embolus. However, due to limitation in terms of timing of contrast bolus, smaller peripheral emboli in lower lobe branches cannot be excluded on the basis of this exam. Status post coronary artery bypass graft. Atherosclerosis of thoracic aorta is noted without aneurysm or dissection. Visualized portion of upper abdomen is unremarkable. No mediastinal mass or adenopathy is noted. Review of the MIP images confirms the above findings. IMPRESSION: Aortic atherosclerosis. Mild bibasilar subsegmental atelectasis. No evidence of large central pulmonary embolus. However, due to limitations of contrast bolus, smaller peripheral emboli in lower lobe branches cannot be excluded on the basis of this exam. Electronically Signed   By: Marijo Conception, M.D.   On: 07/13/2016 17:11    Assessment: 65 y.o. female with multiple risk factors for stroke will likely experienced a transient ischemic attack or possibly small subcortical left cerebral infarction 4 days ago. She has no focal deficits at this point.  Stroke Risk Factors - family history, hypertension and atrial flutter  Plan: 1. HgbA1c, fasting lipid panel 2. MRI, MRA  of the brain without contrast 3. PT consult, OT consult, Speech consult 4. Echocardiogram 5. Carotid dopplers 6. Prophylactic therapy-Anticoagulation: IV heparin per Cardiology 7. Risk factor modification 8. Telemetry monitoring  C.R. Nicole Kindred, MD [facility] [phone]  07/13/2016, 11:24 PM

## 2016-07-13 NOTE — ED Notes (Signed)
Patient placed on cardiac monitor with vitals set for Q 30 mins. Patient family at bedside at this time.

## 2016-07-13 NOTE — ED Triage Notes (Signed)
Patient presents to ED for slurred speech that started last night and today with mouth twitching and numbness to chin area.

## 2016-07-14 ENCOUNTER — Inpatient Hospital Stay (HOSPITAL_COMMUNITY): Payer: Medicare Other

## 2016-07-14 ENCOUNTER — Encounter (HOSPITAL_COMMUNITY): Payer: Self-pay | Admitting: Physician Assistant

## 2016-07-14 DIAGNOSIS — I5022 Chronic systolic (congestive) heart failure: Secondary | ICD-10-CM

## 2016-07-14 DIAGNOSIS — E038 Other specified hypothyroidism: Secondary | ICD-10-CM

## 2016-07-14 DIAGNOSIS — I5043 Acute on chronic combined systolic (congestive) and diastolic (congestive) heart failure: Secondary | ICD-10-CM

## 2016-07-14 DIAGNOSIS — I4892 Unspecified atrial flutter: Secondary | ICD-10-CM

## 2016-07-14 DIAGNOSIS — I5032 Chronic diastolic (congestive) heart failure: Secondary | ICD-10-CM

## 2016-07-14 DIAGNOSIS — I6330 Cerebral infarction due to thrombosis of unspecified cerebral artery: Secondary | ICD-10-CM | POA: Insufficient documentation

## 2016-07-14 DIAGNOSIS — G459 Transient cerebral ischemic attack, unspecified: Secondary | ICD-10-CM

## 2016-07-14 DIAGNOSIS — J439 Emphysema, unspecified: Secondary | ICD-10-CM

## 2016-07-14 DIAGNOSIS — I10 Essential (primary) hypertension: Secondary | ICD-10-CM

## 2016-07-14 LAB — LIPID PANEL
CHOL/HDL RATIO: 3.9 ratio
CHOLESTEROL: 190 mg/dL (ref 0–200)
HDL: 49 mg/dL (ref >40)
LDL Cholesterol: 128 mg/dL — ABNORMAL HIGH (ref 0–99)
Triglycerides: 67 mg/dL (ref <150)
VLDL: 13 mg/dL (ref 0–40)

## 2016-07-14 LAB — CBC
HCT: 30.6 % — ABNORMAL LOW (ref 36.0–46.0)
HEMOGLOBIN: 8.8 g/dL — AB (ref 12.0–15.0)
MCH: 24.8 pg — AB (ref 26.0–34.0)
MCHC: 28.8 g/dL — AB (ref 30.0–36.0)
MCV: 86.2 fL (ref 78.0–100.0)
PLATELETS: 280 10*3/uL (ref 150–400)
RBC: 3.55 MIL/uL — ABNORMAL LOW (ref 3.87–5.11)
RDW: 17.1 % — AB (ref 11.5–15.5)
WBC: 5.9 10*3/uL (ref 4.0–10.5)

## 2016-07-14 LAB — COMPREHENSIVE METABOLIC PANEL
ALBUMIN: 2.7 g/dL — AB (ref 3.5–5.0)
ALK PHOS: 51 U/L (ref 38–126)
ALT: 9 U/L — AB (ref 14–54)
ANION GAP: 6 (ref 5–15)
AST: 11 U/L — ABNORMAL LOW (ref 15–41)
BILIRUBIN TOTAL: 0.3 mg/dL (ref 0.3–1.2)
BUN: 19 mg/dL (ref 6–20)
CALCIUM: 8.6 mg/dL — AB (ref 8.9–10.3)
CO2: 37 mmol/L — AB (ref 22–32)
CREATININE: 0.88 mg/dL (ref 0.44–1.00)
Chloride: 96 mmol/L — ABNORMAL LOW (ref 101–111)
GFR calc Af Amer: >60 mL/min (ref >60)
GFR calc non Af Amer: >60 mL/min (ref >60)
GLUCOSE: 100 mg/dL — AB (ref 65–99)
Potassium: 3.9 mmol/L (ref 3.5–5.1)
SODIUM: 139 mmol/L (ref 135–145)
TOTAL PROTEIN: 7 g/dL (ref 6.5–8.1)

## 2016-07-14 LAB — ECHOCARDIOGRAM COMPLETE
Height: 60 in
WEIGHTICAEL: 4160 [oz_av]

## 2016-07-14 LAB — BASIC METABOLIC PANEL
BUN: 19 mg/dL (ref 4–21)
Creatinine: 0.9 mg/dL (ref 0.5–1.1)
GLUCOSE: 100 mg/dL
POTASSIUM: 3.9 mmol/L (ref 3.4–5.3)
Sodium: 139 mmol/L (ref 137–147)

## 2016-07-14 LAB — CBC AND DIFFERENTIAL
HCT: 31 % — AB (ref 36–46)
HEMOGLOBIN: 8.8 g/dL — AB (ref 12.0–16.0)
PLATELETS: 280 10*3/uL (ref 150–399)
WBC: 5.9 10*3/mL

## 2016-07-14 LAB — HEPARIN LEVEL (UNFRACTIONATED): HEPARIN UNFRACTIONATED: 0.15 [IU]/mL — AB (ref 0.30–0.70)

## 2016-07-14 LAB — HEPATIC FUNCTION PANEL
ALT: 9 U/L (ref 7–35)
AST: 11 U/L — AB (ref 13–35)
Alkaline Phosphatase: 51 U/L (ref 25–125)
Bilirubin, Total: 0.3 mg/dL

## 2016-07-14 LAB — VALPROIC ACID LEVEL: VALPROIC ACID LVL: 77 ug/mL (ref 50.0–100.0)

## 2016-07-14 LAB — TROPONIN I: Troponin I: <0.03 ng/mL (ref <0.03)

## 2016-07-14 MED ORDER — SODIUM CHLORIDE 0.9 % IV SOLN
INTRAVENOUS | Status: DC
  Administered 2016-07-16: 500 mL via INTRAVENOUS

## 2016-07-14 MED ORDER — CHLORHEXIDINE GLUCONATE CLOTH 2 % EX PADS
6.0000 | MEDICATED_PAD | Freq: Every day | CUTANEOUS | Status: DC
  Administered 2016-07-14 – 2016-07-18 (×4): 6 via TOPICAL

## 2016-07-14 MED ORDER — PERFLUTREN LIPID MICROSPHERE
1.0000 mL | INTRAVENOUS | Status: AC | PRN
  Administered 2016-07-14: 2 mL via INTRAVENOUS
  Filled 2016-07-14: qty 10

## 2016-07-14 MED ORDER — ACETAMINOPHEN 325 MG PO TABS
650.0000 mg | ORAL_TABLET | Freq: Four times a day (QID) | ORAL | Status: DC | PRN
  Administered 2016-07-14: 650 mg via ORAL
  Filled 2016-07-14: qty 2

## 2016-07-14 MED ORDER — RIVAROXABAN 20 MG PO TABS
20.0000 mg | ORAL_TABLET | Freq: Every day | ORAL | Status: DC
  Administered 2016-07-14 – 2016-07-17 (×4): 20 mg via ORAL
  Filled 2016-07-14 (×5): qty 1

## 2016-07-14 MED ORDER — MUPIROCIN 2 % EX OINT
1.0000 "application " | TOPICAL_OINTMENT | Freq: Two times a day (BID) | CUTANEOUS | Status: DC
  Administered 2016-07-14 – 2016-07-18 (×9): 1 via NASAL
  Filled 2016-07-14 (×4): qty 22

## 2016-07-14 MED ORDER — TRAMADOL HCL 50 MG PO TABS
50.0000 mg | ORAL_TABLET | Freq: Three times a day (TID) | ORAL | Status: DC | PRN
  Administered 2016-07-14: 50 mg via ORAL
  Filled 2016-07-14: qty 1

## 2016-07-14 NOTE — Care Management Note (Addendum)
Case Management Note  Patient Details  Name: [place] Kamerer MRN: [id number] Date of Birth: October 23, 1951  Subjective/Objective:   Patient lives with her daughter, [name].  Patient states she pays rent to stay there.  She has a hospital bed , rolling walker .  Patient states she has a pcp, insurance that helps with her medications and she has transportation at discharge.  Per pt/lot eval no f/u needed.  NCM awaiting benefit check for xarelto 20mg  daily.  NCM gave patient 30 day savings card for xarelto. Patient said she can afford the 97.00. NCM will cont to follow for dc needs.                  Action/Plan:   Expected Discharge Date:                  Expected Discharge Plan:  Home/Self Care  In-House Referral:     Discharge planning Services  CM Consult  Post Acute Care Choice:    Choice offered to:     DME Arranged:    DME Agency:     HH Arranged:    HH Agency:     Status of Service:  In process, will continue to follow  If discussed at Long Length of Stay Meetings, dates discussed:    Additional Comments:  Zenon Mayo, RN 07/14/2016, 2:26 PM

## 2016-07-14 NOTE — Progress Notes (Signed)
[facility] PROGRESS NOTE  Tonya Hogan [id number] DOB: 1951-08-28 DOA: 07/13/2016 PCP: No PCP Per Patient  Interim summary and HPI 65 y.o. female with CAD status post CABG, diastolic CHF, hypertension, bipolar disorder, COPD presents to the ER because of slurred speech. Patient has been having difficulty with speech since yesterday morning. Denies any weakness of the upper or lower extremity. Denies any visual symptoms or difficulty swallowing. While in the ER patient is found to be in atrial flutter with RVR. Patient was initially started on Cardizem infusion following which patient blood pressure started to drop. On my exam patient is still in atrial flutter with RVR. Patient still has slurring of the speech. CT of the head and CT angiogram of the chest was unremarkable.  Assessment/Plan: 1-TIA: with symptoms resolution now -MRI w/o stroke, MRA with R ICA based aneurysm, no significant stenosis  -2-D echo: w/o source of emboli appreciated; reduced EF. TEE pending  -will complete workup (carotid dopplers and A1C pending) -appears to be secondary to Atrial Flutter -secondary prevention with xarelto -LDL 128, will continue statins -continue risk factors modifications   2-Atrial flutter with RVR: -cardiology consulted -will continue amiodarone drip -plans are for TEE and cardioversion on Friday -xarelto for anticoagulation  3-HTN: -stable currently; will continue current medication regimen   4-HLD: -continue statins  5-morbid obesity: -Body mass index is 50.78 kg/m. -low calorie diet and exercise discussed with patient  6- hypothyroidism: -continue synthroid  7-chronic systolic heart failure: EF 30-35% -appears compensated; even difficult to assess due to body habitus -will follow cardiology rec's regarding medication management   8-COPD: -no wheezing currently -will continue as needed bronchodilators   Code Status: Full Family Communication: no family at  bedside  Disposition Plan: to be determine; but will anticipate discharge home when medically stable.   Consultants:  Neurology  Cardiology   Procedures:  2-D echo:  - Left ventricle: The cavity size was normal. Wall thickness was   normal. Systolic function was moderately to severely reduced. The   estimated ejection fraction was in the range of 30% to 35%.  Impressions: - The echo is technically very difficult due to the patients body   habitus.   The LV is not seen well. The LV function appears to be at least   moderately reduced.   Carotid duplex: pending   TEE: pending   See below for x-ray reports   Antibiotics:  None   HPI/Subjective: Feeling better. Still with mild sensation of palpitation. No slurred speech, no focal neurologic deficits.  Objective: Vitals:   07/14/16 1250 07/14/16 1600  BP: (!) 136/124   Pulse:    Resp:    Temp: 98.3 F (36.8 C) 97.5 F (36.4 C)    Intake/Output Summary (Last 24 hours) at 07/14/16 1801 Last data filed at 07/14/16 1728  Gross per 24 hour  Intake           955.74 ml  Output              325 ml  Net           630.74 ml   Filed Weights   07/13/16 1315  Weight: 117.9 kg (260 lb)    Exam:   General:  Afebrile, no CP, no SOX. Reports some palpitations. Patient is feeling better overall and reports complete resolution of facial numbness and slurred speech.  Cardiovascular: irregular, no rubs or gallops  Respiratory: decrease Bs at bases, no wheezing, positive scattered rhonchi   Abdomen: soft,  Nt, ND, positive BS  Musculoskeletal: no cyanosis or clubbing, complaining of pain on her knees from OA.  Data Reviewed: Basic Metabolic Panel:  Recent Labs Lab 07/13/16 1335 07/13/16 2206 07/14/16 0607  NA 140  --  139  K 4.3  --  3.9  CL 99*  --  96*  CO2 34*  --  37*  GLUCOSE 134*  --  100*  BUN 32*  --  19  CREATININE 0.94  --  0.88  CALCIUM 8.8*  --  8.6*  MG 1.8 1.9  --    Liver Function  Tests:  Recent Labs Lab 07/13/16 1335 07/14/16 0607  AST 11* 11*  ALT 10* 9*  ALKPHOS 49 51  BILITOT 0.2* 0.3  PROT 7.7 7.0  ALBUMIN 2.9* 2.7*   CBC:  Recent Labs Lab 07/13/16 1335 07/14/16 0607  WBC 6.6 5.9  NEUTROABS 3.9  --   HGB 9.1* 8.8*  HCT 30.8* 30.6*  MCV 85.6 86.2  PLT 298 280   Cardiac Enzymes:  Recent Labs Lab 07/13/16 1335 07/13/16 2206 07/14/16 0324 07/14/16 0607  TROPONINI <0.03 <0.03 <0.03 <0.03   BNP (last 3 results)  Recent Labs  10/06/15 1800 10/21/15 1235 07/13/16 1335  BNP 532.1* 152.1* 328.4*   CBG: No results for input(s): GLUCAP in the last 168 hours.  Recent Results (from the past 240 hour(s))  MRSA PCR Screening     Status: Abnormal   Collection Time: 07/13/16  6:46 PM  Result Value Ref Range Status   MRSA by PCR POSITIVE (A) NEGATIVE Final    Comment:        The GeneXpert MRSA Assay (FDA approved for NASAL specimens only), is one component of a comprehensive MRSA colonization surveillance program. It is not intended to diagnose MRSA infection nor to guide or monitor treatment for MRSA infections. RESULT CALLED TO, READ BACK BY AND VERIFIED WITHHaskell Riling RN [id number] 07/13/16 A BROWNING      Studies: Dg Chest 2 View  Result Date: 07/13/2016 CLINICAL DATA:  Slurred speech. EXAM: CHEST  2 VIEW COMPARISON:  Radiographs of April 30, 2016. FINDINGS: Stable cardiomediastinal silhouette. Sternotomy wires are noted. No pneumothorax or pleural effusion is noted. Bony thorax is unremarkable. Slightly improved linear densities are noted in the perihilar regions consistent with improving subsegmental atelectasis. IMPRESSION: Mildly improved bilateral parahilar subsegmental atelectasis. Electronically Signed   By: Marijo Conception, M.D.   On: 07/13/2016 14:56   Ct Head Wo Contrast  Result Date: 07/13/2016 CLINICAL DATA:  Slurred speech beginning last night. EXAM: CT HEAD WITHOUT CONTRAST TECHNIQUE: Contiguous axial images were obtained  from the base of the skull through the vertex without intravenous contrast. COMPARISON:  MRI 10/07/2015 FINDINGS: Brain: No acute intracranial abnormality. Specifically, no hemorrhage, hydrocephalus, mass lesion, acute infarction, or significant intracranial injury. Vascular: No hyperdense vessel or unexpected calcification. Skull: No acute calvarial abnormality Sinuses/Orbits: Visualized paranasal sinuses and mastoids clear. Orbital soft tissues unremarkable. Other: None IMPRESSION: No acute intracranial abnormality. Electronically Signed   By: Rolm Baptise M.D.   On: 07/13/2016 14:45   Ct Angio Chest Pe W And/or Wo Contrast  Result Date: 07/13/2016 CLINICAL DATA:  Tachycardia, shoulder pain. EXAM: CT ANGIOGRAPHY CHEST WITH CONTRAST TECHNIQUE: Multidetector CT imaging of the chest was performed using the standard protocol during bolus administration of intravenous contrast. Multiplanar CT image reconstructions and MIPs were obtained to evaluate the vascular anatomy. CONTRAST:  100 mL of Isovue 370 intravenously. COMPARISON:  CT scan of June 22, 2015. FINDINGS: No pneumothorax or pleural effusion is noted. Mild bibasilar subsegmental atelectasis is noted. There is no evidence of large central pulmonary embolus. However, due to limitation in terms of timing of contrast bolus, smaller peripheral emboli in lower lobe branches cannot be excluded on the basis of this exam. Status post coronary artery bypass graft. Atherosclerosis of thoracic aorta is noted without aneurysm or dissection. Visualized portion of upper abdomen is unremarkable. No mediastinal mass or adenopathy is noted. Review of the MIP images confirms the above findings. IMPRESSION: Aortic atherosclerosis. Mild bibasilar subsegmental atelectasis. No evidence of large central pulmonary embolus. However, due to limitations of contrast bolus, smaller peripheral emboli in lower lobe branches cannot be excluded on the basis of this exam. Electronically  Signed   By: Marijo Conception, M.D.   On: 07/13/2016 17:11   Mr Brain Wo Contrast  Result Date: 07/14/2016 CLINICAL DATA:  65 y/o F; presenting with right facial droop and slurred speech beginning 4 days ago. EXAM: MRI HEAD WITHOUT CONTRAST MRA HEAD WITHOUT CONTRAST TECHNIQUE: Multiplanar, multiecho pulse sequences of the brain and surrounding structures were obtained without intravenous contrast. Angiographic images of the head were obtained using MRA technique without contrast. COMPARISON:  CT of head dated 07/13/2016.  MRI brain 10/07/2015. FINDINGS: MRI HEAD FINDINGS Brain: No diffusion restriction to suggest acute infarct. There are punctate foci of susceptibility hypointensity within the cerebellum and bilateral frontal and parietal subcortical white matter without significant interval change compatible with prior microhemorrhage. Motion artifact partially degrades the FLAIR sequence. 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 The left parietal and temporal infarcts have mild foraminal 7 staining. No focal mass effect. Mild parenchymal atrophy. Extra-axial space: No hydrocephalus. No midline shift. No effacement of basilar cisterns. No extra-axial collection is identified. Proximal intracranial flow voids are maintained. No abnormality of the cervical medullary junction. Other: No abnormal signal of the paranasal sinuses. Partial opacification of right mastoid air cells. Orbits are unremarkable. Calvarium is unremarkable. 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 Anterior cerebral arteries:  Patent. Middle cerebral arteries: Patent. Anterior communicating artery: Patent. Posterior communicating arteries:  Patent. Posterior cerebral arteries: Patent bilaterally. Small left P1 segment with  large left posterior communicating artery consistent with fetal circulation, normal variant. Basilar artery:  Patent. Vertebral arteries:  Patent.  Left dominant. No evidence of significant stenosis or large vessel occlusion. IMPRESSION: 1. No evidence of acute/early subacute infarct or new intracranial hemorrhage. 2. 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 3. Stable parenchymal volume loss and chronic infarcts within left temporal, left parietal, and right cerebellum. 4. No significant stenosis or large vessel occlusion of the circle of Willis. These results will be called to the ordering clinician or representative by the Radiologist Assistant, and communication documented in the PACS or zVision Dashboard. Electronically Signed   By: Kristine Garbe M.D.   On: 07/14/2016 01:58   Mr Jodene Nam Head/brain [id number] Cm  Result Date: 07/14/2016 CLINICAL DATA:  65 y/o F; presenting with right facial droop and slurred speech beginning 4 days ago. EXAM: MRI HEAD WITHOUT CONTRAST MRA HEAD WITHOUT CONTRAST TECHNIQUE: Multiplanar, multiecho pulse sequences of the brain and surrounding structures were obtained without intravenous contrast. Angiographic images of the head were obtained using MRA technique without contrast. COMPARISON:  CT of head dated 07/13/2016.  MRI brain 10/07/2015. FINDINGS: MRI HEAD FINDINGS Brain: No diffusion restriction to suggest acute infarct. There are punctate foci of susceptibility hypointensity within the cerebellum and bilateral frontal and parietal subcortical white matter without significant interval change compatible with prior microhemorrhage. Motion artifact partially degrades the FLAIR sequence. 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 The left parietal and temporal infarcts have mild foraminal 7 staining. No focal mass effect. Mild parenchymal atrophy. Extra-axial  space: No hydrocephalus. No midline shift. No effacement of basilar cisterns. No extra-axial collection is identified. Proximal intracranial flow voids are maintained. No abnormality of the cervical medullary junction. Other: No abnormal signal of the paranasal sinuses. Partial opacification of right mastoid air cells. Orbits are unremarkable. Calvarium is unremarkable. 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 Anterior cerebral arteries:  Patent. Middle cerebral arteries: Patent. Anterior communicating artery: Patent. Posterior communicating arteries:  Patent. Posterior cerebral arteries: Patent bilaterally. Small left P1 segment with large left posterior communicating artery consistent with fetal circulation, normal variant. Basilar artery:  Patent. Vertebral arteries:  Patent.  Left dominant. No evidence of significant stenosis or large vessel occlusion. IMPRESSION: 1. No evidence of acute/early subacute infarct or new intracranial hemorrhage. 2. 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 3. Stable parenchymal volume loss and chronic infarcts within left temporal, left parietal, and right cerebellum. 4. No significant stenosis or large vessel occlusion of the circle of Willis. These results will be called to the ordering clinician or representative by the Radiologist Assistant, and communication documented in the PACS or zVision Dashboard. Electronically Signed   By: Kristine Garbe M.D.   On: 07/14/2016 01:58    Scheduled Meds: .  stroke: mapping our early stages of recovery book   Does not apply Once  . aspirin  300 mg Rectal Daily   Or  . aspirin  325 mg Oral Daily  . atorvastatin  20 mg Oral q1800  . Chlorhexidine Gluconate Cloth  6 each Topical Q0600  . divalproex  1,000 mg Oral BID  . gabapentin  600  mg Oral BID  . levothyroxine  125 mcg Oral QAC breakfast  . mupirocin ointment  1 application Nasal BID  . omega-3 acid ethyl esters  1 g Oral Daily  . risperiDONE  1 mg Oral QHS  . rivaroxaban  20 mg Oral Q supper   Continuous Infusions: . sodium chloride 10 mL/hr at 07/13/16 2119  . amiodarone 30 mg/hr (07/14/16 0300)    Principal Problem:   Atrial flutter with rapid ventricular response (HCC) Active Problems:   Essential hypertension   COPD (chronic obstructive pulmonary disease) (HCC)   Facial droop   Hypothyroidism   S/P CABG x 3   Chronic diastolic heart failure (HCC)   Slurred speech   Cerebral thrombosis with cerebral infarction    Time spent: 30 minutes    Barton Dubois  [facility] Pager [phone]. If 7PM-7AM, please contact night-coverage at [email or web address], password [facility] 07/14/2016, 6:01 PM  LOS: 1 day

## 2016-07-14 NOTE — Progress Notes (Signed)
[place] for heparin>xarelto Indication: atrial fibrillation  Allergies  Allergen Reactions  . Methimazole Hives and Rash  . Propylthiouracil Hives  . Chocolate     Sneezing, coughing  . Other Other (See Comments)    Corn, cabbage, greens = Rectal bleeding  . Peanut-Containing Drug Products     Sneezing,coughing  . Penicillins Itching and Cough    Sneezing Has patient had a PCN reaction causing immediate rash, facial/tongue/throat swelling, SOB or lightheadedness with hypotension: Yes Has patient had a PCN reaction causing severe rash involving mucus membranes or skin necrosis: Unknown Has patient had a PCN reaction that required hospitalization: Unknown Has patient had a PCN reaction occurring within the last 10 years: No If all of the above answers are "NO", then may proceed with Cephalosporin use.   . Vicodin [Hydrocodone-Acetaminophen] Rash    Patient Measurements: Height: 5' (152.4 cm) Weight: 260 lb (117.9 kg) IBW/kg (Calculated) : 45.5 Heparin Dosing Weight: 75kg  Vital Signs: Temp: 97.4 F (36.3 C) (08/23 0812) Temp Source: Oral (08/23 0812) BP: 100/73 (08/23 0342) Pulse Rate: 120 (08/23 0342)  Labs:  Recent Labs  07/13/16 1335 07/13/16 2206 07/14/16 0324 07/14/16 0607  HGB 9.1*  --   --  8.8*  HCT 30.8*  --   --  30.6*  PLT 298  --   --  280  HEPARINUNFRC  --   --   --  0.15*  CREATININE 0.94  --   --  0.88  TROPONINI <0.03 <0.03 <0.03 <0.03    Estimated Creatinine Clearance: 76 mL/min (by C-G formula based on SCr of 0.88 mg/dL).   Medical History: Past Medical History:  Diagnosis Date  . Arthritis   . Bipolar 1 disorder ([place])   . CAD (coronary artery disease)   . CHF (congestive heart failure) ([place])   . Depression   . Hypertension   . Hypothyroidism   . Obesity   . S/P CABG x 3   . Thyroid ca ([place])   . Thyroid disease     Medications:  Prescriptions Prior to Admission  Medication Sig Dispense  Refill Last Dose  . acetaminophen (RA ACETAMINOPHEN) 650 MG CR tablet Take 650 mg by mouth every 8 (eight) hours as needed. pain   07/12/2016 at pm  . albuterol (PROVENTIL HFA;VENTOLIN HFA) 108 (90 BASE) MCG/ACT inhaler Inhale 2 puffs into the lungs every 6 (six) hours as needed for wheezing or shortness of breath. 1 Inhaler 0 Past Month at Unknown time  . amLODipine (NORVASC) 5 MG tablet Take 5 mg by mouth daily.   07/13/2016 at am  . atorvastatin (LIPITOR) 20 MG tablet Take 20 mg by mouth at bedtime.   07/12/2016 at pm  . bisacodyl (DULCOLAX) 5 MG EC tablet Take 5 mg by mouth 2 (two) times daily as needed for mild constipation.   Past Week at Unknown time  . divalproex (DEPAKOTE ER) 500 MG 24 hr tablet Take 1,000 mg by mouth 2 (two) times daily.   07/13/2016 at am  . ferrous sulfate 325 (65 FE) MG tablet Take 325 mg by mouth daily with breakfast.   07/13/2016 at am  . furosemide (LASIX) 40 MG tablet Take 1 tablet (40 mg total) by mouth 2 (two) times daily. May take additional 40 mg in AM for weight 266 or greater (Patient taking differently: Take 40 mg by mouth every morning. May take additional 40 mg in AM for weight 266 or greater) 75 tablet 1 07/13/2016  at am  . gabapentin (NEURONTIN) 600 MG tablet Take 600 mg by mouth 2 (two) times daily.   07/13/2016 at am  . levothyroxine (SYNTHROID, LEVOTHROID) 125 MCG tablet Take 125 mcg by mouth daily before breakfast.   07/13/2016 at am  . lisinopril-hydrochlorothiazide (PRINZIDE,ZESTORETIC) 20-12.5 MG tablet Take 1 tablet by mouth daily.   07/13/2016 at am  . metoprolol tartrate (LOPRESSOR) 25 MG tablet Take 25 mg by mouth 2 (two) times daily.   07/13/2016 at am  . Omega-3 Fatty Acids (FISH OIL) 1000 MG CAPS Take 1,000 mg by mouth daily.   07/13/2016 at am  . OXYGEN Inhale 2 L/min into the lungs as needed.   06/29/2016 at night  . polyethylene glycol (MIRALAX / GLYCOLAX) packet Take 17 g by mouth daily as needed for mild constipation.    Past Week at Unknown time  .  potassium chloride SA (K-DUR,KLOR-CON) 20 MEQ tablet Take 1 tablet (20 mEq total) by mouth daily. 30 tablet 0 07/13/2016 at am  . risperiDONE (RISPERDAL) 2 MG tablet Take 2 mg by mouth at bedtime.   07/12/2016 at pm  . zolpidem (AMBIEN) 5 MG tablet Take 5 mg by mouth at bedtime.   07/12/2016 at pm  . miconazole (MICONAZOLE 7) 2 % vaginal cream Place 1 Applicatorful vaginally at bedtime.   Taking   Scheduled:  .  stroke: mapping our early stages of recovery book   Does not apply Once  . aspirin  300 mg Rectal Daily   Or  . aspirin  325 mg Oral Daily  . atorvastatin  20 mg Oral q1800  . Chlorhexidine Gluconate Cloth  6 each Topical Q0600  . divalproex  1,000 mg Oral BID  . gabapentin  600 mg Oral BID  . levothyroxine  125 mcg Oral QAC breakfast  . mupirocin ointment  1 application Nasal BID  . omega-3 acid ethyl esters  1 g Oral Daily  . risperiDONE  1 mg Oral QHS    Assessment: 65 yo female with aflutter (CHADSVASC=4) and TIA on admit. Pharmacy consulted to transition heparin to Xarelto. No AC pta. CrCl~75-80. Hg low stable, plt wnl, noted some hemorrhoidal bleeding last week, none now.  Goal of Therapy:  Stroke prevention Monitor platelets by anticoagulation protocol: Yes   Plan:  Heparin IV >> Xarelto 20mg  PO Qsupper - turn off heparin drip when 1st dose of Xarelto given (communicated with RN) Monitor CBC, s/sx bleeding  Elicia Lamp, PharmD, BCPS Clinical Pharmacist Pager [phone] 07/14/2016 11:35 AM

## 2016-07-14 NOTE — Consult Note (Signed)
CARDIOLOGY CONSULT NOTE   Patient ID: Tonya Hogan MRN: [id number] DOB/AGE: 65/08/1951 65 y.o.  Admit date: 07/13/2016  Primary Physician   No PCP Per Patient Primary Cardiologist   Darrick Grinder, NP w/ CHF clinic Reason for Consultation   Atrial flutter Requesting MD: Dr Dyann Kief  [id number] Lechler is a 65 y.o. year old female with a history of HTN, CABG 01/31/2010 at [facility], bipolar, obesity, COPD, chronic anemia, D-CHF  with EF 55-60% and grade 2 dd, thyroid CA>>hypothyroid.   Prev problems w/ noncompliance, but seen in CHF clinic 10/2015 and was doing well. F/u in 6 weeks.  Seen in ER 06/30/2016 for mania and transferred to [facility] for psych treatment. ECG clearly SR.  Admitted 08/22 w/ slurred speech, ?TIA vs CVA. Pt in atrial flutter, cards asked to see.    Tonya Hogan is not aware that her HR is rapid or the rhythm is abnormal. She has not had chest pain, been light-headed or had SOB. She has never been diagnosed with an abnormal heart rhythm before. She has no recent cardiology eval, her only visit was with the CHF clinic.  Her speech has improved and she has no obvious deficits now. She is trying to do better with her diet. Her scales are broken and she is no longer able to weigh herself.  Dr Leonie Man has cleared her for oral anticoagulation.    Past Medical History:  Diagnosis Date  . Arthritis   . Bipolar 1 disorder ([place])   . CAD (coronary artery disease)   . CHF (congestive heart failure) ([place])   . Depression   . Hypertension   . Hypothyroidism   . Obesity   . S/P CABG x 3   . Thyroid ca ([place])   . Thyroid disease      Past Surgical History:  Procedure Laterality Date  . ABDOMINAL HYSTERECTOMY    . CORONARY ARTERY BYPASS GRAFT    . TONSILLECTOMY      Allergies  Allergen Reactions  . Methimazole Hives and Rash  . Propylthiouracil Hives  . Chocolate     Sneezing, coughing  . Other Other (See Comments)    Corn, cabbage, greens = Rectal bleeding  .  Peanut-Containing Drug Products     Sneezing,coughing  . Penicillins Itching and Cough    Sneezing Has patient had a PCN reaction causing immediate rash, facial/tongue/throat swelling, SOB or lightheadedness with hypotension: Yes Has patient had a PCN reaction causing severe rash involving mucus membranes or skin necrosis: Unknown Has patient had a PCN reaction that required hospitalization: Unknown Has patient had a PCN reaction occurring within the last 10 years: No If all of the above answers are "NO", then may proceed with Cephalosporin use.   . Vicodin [Hydrocodone-Acetaminophen] Rash    I have reviewed the patient's current medications .  stroke: mapping our early stages of recovery book   Does not apply Once  . aspirin  300 mg Rectal Daily   Or  . aspirin  325 mg Oral Daily  . atorvastatin  20 mg Oral q1800  . Chlorhexidine Gluconate Cloth  6 each Topical Q0600  . divalproex  1,000 mg Oral BID  . gabapentin  600 mg Oral BID  . levothyroxine  125 mcg Oral QAC breakfast  . mupirocin ointment  1 application Nasal BID  . omega-3 acid ethyl esters  1 g Oral Daily  . risperiDONE  1 mg Oral QHS   . sodium chloride 10 mL/hr  at 07/13/16 2119  . amiodarone 30 mg/hr (07/14/16 0300)  . heparin 1,200 Units/hr (07/14/16 0940)     Prior to Admission medications   Medication Sig  acetaminophen (RA ACETAMINOPHEN) 650 MG CR tablet Take 650 mg by mouth every 8 (eight) hours as needed. pain  albuterol (PROVENTIL HFA;VENTOLIN HFA) 108 (90 BASE) MCG/ACT inhaler Inhale 2 puffs into the lungs every 6 (six) hours as needed for wheezing or shortness of breath.  amLODipine (NORVASC) 5 MG tablet Take 5 mg by mouth daily.  atorvastatin (LIPITOR) 20 MG tablet Take 20 mg by mouth at bedtime.  bisacodyl (DULCOLAX) 5 MG EC tablet Take 5 mg by mouth 2 (two) times daily as needed for mild constipation.  divalproex (DEPAKOTE ER) 500 MG 24 hr tablet Take 1,000 mg by mouth 2 (two) times daily.  ferrous  sulfate 325 (65 FE) MG tablet Take 325 mg by mouth daily with breakfast.  furosemide (LASIX) 40 MG tablet Take 1 tablet (40 mg total) by mouth 2 (two) times daily. May take additional 40 mg in AM for weight 266 or greater Patient taking differently: Take 40 mg by mouth every morning. May take additional 40 mg in AM for weight 266 or greater  gabapentin (NEURONTIN) 600 MG tablet Take 600 mg by mouth 2 (two) times daily.  levothyroxine (SYNTHROID, LEVOTHROID) 125 MCG tablet Take 125 mcg by mouth daily before breakfast.  lisinopril-hydrochlorothiazide (PRINZIDE,ZESTORETIC) 20-12.5 MG tablet Take 1 tablet by mouth daily.  metoprolol tartrate (LOPRESSOR) 25 MG tablet Take 25 mg by mouth 2 (two) times daily.  Omega-3 Fatty Acids (FISH OIL) 1000 MG CAPS Take 1,000 mg by mouth daily.  OXYGEN Inhale 2 L/min into the lungs as needed.  polyethylene glycol (MIRALAX / GLYCOLAX) packet Take 17 g by mouth daily as needed for mild constipation.   potassium chloride SA (K-DUR,KLOR-CON) 20 MEQ tablet Take 1 tablet (20 mEq total) by mouth daily.  risperiDONE (RISPERDAL) 2 MG tablet Take 2 mg by mouth at bedtime.  zolpidem (AMBIEN) 5 MG tablet Take 5 mg by mouth at bedtime.  miconazole (MICONAZOLE 7) 2 % vaginal cream Place 1 Applicatorful vaginally at bedtime.     Social History   Social History  . Marital status: Widowed    Spouse name: N/A  . Number of children: N/A  . Years of education: N/A   Occupational History  . Retired    Social History Main Topics  . Smoking status: Never Smoker  . Smokeless tobacco: Never Used  . Alcohol use No  . Drug use: No  . Sexual activity: No   Other Topics Concern  . Not on file   Social History Narrative   Has a daughter in the area.    Family Status  Relation Status  . Sister   . Other   . Mother Deceased  . Father Deceased   Family History  Problem Relation Age of Onset  . Hypertension Sister   . Bipolar disorder Other      ROS:  Full 14 point  review of systems complete and found to be negative unless listed above.  Physical Exam: Blood pressure 100/73, pulse (!) 120, temperature 97.4 F (36.3 C), temperature source Oral, resp. rate (!) 24, height 5' (1.524 m), weight 260 lb (117.9 kg), SpO2 95 %.  General: Well developed, well nourished, female in no acute distress Head: Eyes PERRLA, No xanthomas.   Normocephalic and atraumatic, oropharynx without edema or exudate. Dentition: good Lungs: decreased BS bases Heart: Rapid  HR, RRR S1 S2, no rub/gallop, no murmur. pulses are 2+ all 4 extrem.  Neck: No carotid bruits. No lymphadenopathy.  JVD not elevated but difficult to assess 2nd body habitus Abdomen: Bowel sounds present, abdomen soft and non-tender without masses or hernias noted. Msk:  No spine or cva tenderness. No weakness, no joint deformities or effusions. Extremities: No clubbing or cyanosis. No edema.  Neuro: Alert and oriented X 3. No focal deficits noted. Psych:  Good affect, responds appropriately Skin: No rashes or lesions noted.  Labs:   Lab Results  Component Value Date   WBC 5.9 07/14/2016   HGB 8.8 (L) 07/14/2016   HCT 30.6 (L) 07/14/2016   MCV 86.2 07/14/2016   PLT 280 07/14/2016     Recent Labs Lab 07/14/16 0607  NA 139  K 3.9  CL 96*  CO2 37*  BUN 19  CREATININE 0.88  CALCIUM 8.6*  PROT 7.0  BILITOT 0.3  ALKPHOS 51  ALT 9*  AST 11*  GLUCOSE 100*  ALBUMIN 2.7*   Magnesium  Date Value Ref Range Status  07/13/2016 1.9 1.7 - 2.4 mg/dL Final    Recent Labs  07/13/16 1335 07/13/16 2206 07/14/16 0324 07/14/16 0607  TROPONINI <0.03 <0.03 <0.03 <0.03   B Natriuretic Peptide  Date/Time Value Ref Range Status  07/13/2016 01:35 PM 328.4 (H) 0.0 - 100.0 pg/mL Final  10/21/2015 12:35 PM 152.1 (H) 0.0 - 100.0 pg/mL Final   Lab Results  Component Value Date   CHOL 190 07/14/2016   HDL 49 07/14/2016   LDLCALC 128 (H) 07/14/2016   TRIG 67 07/14/2016   TSH  Date/Time Value Ref Range  Status  07/13/2016 09:59 PM 0.535 0.350 - 4.500 uIU/mL Final   Echo: 09/2015 - Left ventricle: The cavity size was normal. There was mild   concentric hypertrophy. Systolic function was normal. Wall motion   was normal; there were no regional wall motion abnormalities.   Left ventricular diastolic function parameters were normal. - Aortic valve: Transvalvular velocity was within the normal range.   There was no stenosis. There was no regurgitation. - Mitral valve: Structurally normal valve. - Left atrium: The atrium was mildly dilated. - Right ventricle: The cavity size was normal. Wall thickness was   normal. Systolic function was normal. - Right atrium: Central venous pressure (est): 8 mm Hg. - Atrial septum: No defect or patent foramen ovale was identified. - Inferior vena cava: Well visualized. The vessel was dilated. The   respirophasic diameter changes were in the normal range (>= 50%).  ECG:  08/22 Atrial flutter, RVR, HR 125 Clearly SR 06/30/2016 ECG  Radiology:  Dg Chest 2 View Result Date: 07/13/2016 CLINICAL DATA:  Slurred speech. EXAM: CHEST  2 VIEW COMPARISON:  Radiographs of April 30, 2016. FINDINGS: Stable cardiomediastinal silhouette. Sternotomy wires are noted. No pneumothorax or pleural effusion is noted. Bony thorax is unremarkable. Slightly improved linear densities are noted in the perihilar regions consistent with improving subsegmental atelectasis. IMPRESSION: Mildly improved bilateral parahilar subsegmental atelectasis. Electronically Signed   By: Marijo Conception, M.D.   On: 07/13/2016 14:56   Ct Head Wo Contrast Result Date: 07/13/2016 CLINICAL DATA:  Slurred speech beginning last night. EXAM: CT HEAD WITHOUT CONTRAST TECHNIQUE: Contiguous axial images were obtained from the base of the skull through the vertex without intravenous contrast. COMPARISON:  MRI 10/07/2015 FINDINGS: Brain: No acute intracranial abnormality. Specifically, no hemorrhage, hydrocephalus, mass  lesion, acute infarction, or significant intracranial injury. Vascular: No hyperdense vessel  or unexpected calcification. Skull: No acute calvarial abnormality Sinuses/Orbits: Visualized paranasal sinuses and mastoids clear. Orbital soft tissues unremarkable. Other: None IMPRESSION: No acute intracranial abnormality. Electronically Signed   By: Rolm Baptise M.D.   On: 07/13/2016 14:45   Ct Angio Chest Pe W And/or Wo Contrast Result Date: 07/13/2016 CLINICAL DATA:  Tachycardia, shoulder pain. EXAM: CT ANGIOGRAPHY CHEST WITH CONTRAST TECHNIQUE: Multidetector CT imaging of the chest was performed using the standard protocol during bolus administration of intravenous contrast. Multiplanar CT image reconstructions and MIPs were obtained to evaluate the vascular anatomy. CONTRAST:  100 mL of Isovue 370 intravenously. COMPARISON:  CT scan of June 22, 2015. FINDINGS: No pneumothorax or pleural effusion is noted. Mild bibasilar subsegmental atelectasis is noted. There is no evidence of large central pulmonary embolus. However, due to limitation in terms of timing of contrast bolus, smaller peripheral emboli in lower lobe branches cannot be excluded on the basis of this exam. Status post coronary artery bypass graft. Atherosclerosis of thoracic aorta is noted without aneurysm or dissection. Visualized portion of upper abdomen is unremarkable. No mediastinal mass or adenopathy is noted. Review of the MIP images confirms the above findings. IMPRESSION: Aortic atherosclerosis. Mild bibasilar subsegmental atelectasis. No evidence of large central pulmonary embolus. However, due to limitations of contrast bolus, smaller peripheral emboli in lower lobe branches cannot be excluded on the basis of this exam. Electronically Signed   By: Marijo Conception, M.D.   On: 07/13/2016 17:11   Mr Jodene Nam Head/brain [id number] Cm Result Date: 07/14/2016 CLINICAL DATA:  65 y/o F; presenting with right facial droop and slurred speech beginning 4 days  ago. EXAM: MRI HEAD WITHOUT CONTRAST MRA HEAD WITHOUT CONTRAST TECHNIQUE: Multiplanar, multiecho pulse sequences of the brain and surrounding structures were obtained without intravenous contrast. Angiographic images of the head were obtained using MRA technique without contrast. COMPARISON:  CT of head dated 07/13/2016.  MRI brain 10/07/2015. FINDINGS: MRI HEAD FINDINGS Brain: No diffusion restriction to suggest acute infarct. There are punctate foci of susceptibility hypointensity within the cerebellum and bilateral frontal and parietal subcortical white matter without significant interval change compatible with prior microhemorrhage. Motion artifact partially degrades the FLAIR sequence. 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 The left parietal and temporal infarcts have mild foraminal 7 staining. No focal mass effect. Mild parenchymal atrophy. Extra-axial space: No hydrocephalus. No midline shift. No effacement of basilar cisterns. No extra-axial collection is identified. Proximal intracranial flow voids are maintained. No abnormality of the cervical medullary junction. Other: No abnormal signal of the paranasal sinuses. Partial opacification of right mastoid air cells. Orbits are unremarkable. Calvarium is unremarkable. 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 Anterior cerebral arteries:  Patent. Middle cerebral arteries: Patent. Anterior communicating artery: Patent. Posterior communicating arteries:  Patent. Posterior cerebral arteries: Patent bilaterally. Small left P1 segment with large left posterior communicating artery consistent with fetal circulation, normal variant. Basilar artery:  Patent. Vertebral arteries:  Patent.  Left dominant. No evidence of significant stenosis or large  vessel occlusion. IMPRESSION: 1. No evidence of acute/early subacute infarct or new intracranial hemorrhage. 2. 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 3. Stable parenchymal volume loss and chronic infarcts within left temporal, left parietal, and right cerebellum. 4. No significant stenosis or large vessel  occlusion of the circle of Willis. These results will be called to the ordering clinician or representative by the Radiologist Assistant, and communication documented in the PACS or zVision Dashboard. Electronically Signed   By: Kristine Garbe M.D.   On: 07/14/2016 01:58    ASSESSMENT AND PLAN:   The patient was seen today by Dr Acie Fredrickson, the patient evaluated and the data reviewed.   Principal Problem:   Atrial flutter with rapid ventricular response (HCC) - unclear onset - BP too low for Cardizem, HR no change so far w/ amio - MD advise on TEE/DCCV, would have to ensure compliance with anticoagulation - CHADS2VASC=6 (HTN, CAD, CHF, TIA/CVA x 2, female) - change heparin to oral anticoagulant    Anemia - not new, but H&H are lower than previous - no obvious bleeding issues - will order stool guaiac's - Iron profile and/or further eval per IM  Otherwise, per IM, Neurology Active Problems:   Essential hypertension   COPD (chronic obstructive pulmonary disease) (HCC)   Facial droop   Hypothyroidism   S/P CABG x 3   Chronic diastolic heart failure ([place])   Slurred speech   Cerebral thrombosis with cerebral infarction   Signed: Lenoard Aden 07/14/2016 10:52 AM Beeper [id number]  Co-Sign MD   Attending Note:   The patient was seen and examined.  Agree with assessment and plan as noted above.  Changes made to the above note as needed.  Patient seen and independently examined with Rosaria Ferries, PA .   We discussed all aspects of the encounter. I agree with the assessment and plan as stated above.  Pt presents  with stroke like symptoms. These have improved. Found to have atrial flutter with HR of 118.    She is asymptomatic.   Is on amiodarone and heparin drip  Will transition to Xarelto 20 mg a day Schedule TEE / Cardioversion for Friday  She has a hx of noncompliance We need to make sure that she gets her DOAC upon leaving the hospital     I have spent a total of 40 minutes with patient reviewing hospital  notes , telemetry, EKGs, labs and examining patient as well as establishing an assessment and plan that was discussed with the patient. > 50% of time was spent in direct patient care.    Thayer Headings, Brooke Bonito., MD, [facility] 07/14/2016, 11:21 AM [address],  [place] Pager [phone]

## 2016-07-14 NOTE — Progress Notes (Signed)
  Echocardiogram 2D Echocardiogram with definity has been performed.  Darlina Sicilian M 07/14/2016, 12:38 PM

## 2016-07-14 NOTE — Progress Notes (Signed)
Nils Pyle, RN        S/W TEE @ [facility] # [phone]   XARELTO   20 MG DAILY ( 30 )   COVER- YES  CO-PAY- $ 97.58  TIER- 3 DRUG  PRIOR APPROVAL - YES # [phone]  PHARMACY : [facility], [place] ,[facility] [place]

## 2016-07-14 NOTE — Progress Notes (Signed)
[place] for heparin  Indication: atrial fibrillation  Allergies  Allergen Reactions  . Methimazole Hives and Rash  . Propylthiouracil Hives  . Chocolate     Sneezing, coughing  . Other Other (See Comments)    Corn, cabbage, greens = Rectal bleeding  . Peanut-Containing Drug Products     Sneezing,coughing  . Penicillins Itching and Cough    Sneezing Has patient had a PCN reaction causing immediate rash, facial/tongue/throat swelling, SOB or lightheadedness with hypotension: Yes Has patient had a PCN reaction causing severe rash involving mucus membranes or skin necrosis: Unknown Has patient had a PCN reaction that required hospitalization: Unknown Has patient had a PCN reaction occurring within the last 10 years: No If all of the above answers are "NO", then may proceed with Cephalosporin use.   . Vicodin [Hydrocodone-Acetaminophen] Rash    Patient Measurements: Height: 5' (152.4 cm) Weight: 260 lb (117.9 kg) IBW/kg (Calculated) : 45.5 Heparin Dosing Weight: 75kg  Vital Signs: Temp: 97.7 F (36.5 C) (08/23 0342) Temp Source: Oral (08/23 0342) BP: 100/73 (08/23 0342) Pulse Rate: 120 (08/23 0342)  Labs:  Recent Labs  07/13/16 1335 07/13/16 2206 07/14/16 0324 07/14/16 0607  HGB 9.1*  --   --  8.8*  HCT 30.8*  --   --  30.6*  PLT 298  --   --  280  HEPARINUNFRC  --   --   --  0.15*  CREATININE 0.94  --   --   --   TROPONINI <0.03 <0.03 <0.03  --     Estimated Creatinine Clearance: 71.1 mL/min (by C-G formula based on SCr of 0.94 mg/dL).  Assessment: 65 yo female with aflutter/CVA for heparin   Goal of Therapy:  Heparin level= 0.3-0.5 Monitor platelets by anticoagulation protocol: Yes   Plan:  Increase Heparin 1200 units/hr Check heparin level in 8 hours.  Phillis Knack, PharmD, BCPS  07/14/2016 7:16 AM

## 2016-07-14 NOTE — Evaluation (Signed)
Physical Therapy Evaluation Patient Details Name: Tonya Hogan MRN: [id number] DOB: 31-Aug-1951 Today's Date: 07/14/2016   History of Present Illness  65 y.o. female with CAD status post CABG, diastolic CHF, hypertension, bipolar disorder, COPD presents to the ER because of slurred speech.[facility] MRI -. atrial flutter with RVR  Clinical Impression  Patient demonstrates deficits in functional mobility as indicated below. Will need continued skilled PT to address deficits and maximize function. Will see as indicated and progress as tolerated.    Follow Up Recommendations No PT follow up;Supervision - Intermittent    Equipment Recommendations  None recommended by PT    Recommendations for Other Services       Precautions / Restrictions Precautions Precautions: Fall      Mobility  Bed Mobility Overal bed mobility: Needs Assistance Bed Mobility: Supine to Sit     Supine to sit: Supervision     General bed mobility comments: supervision for safety, increased effort, VCs for positioning  Transfers Overall transfer level: Needs assistance Equipment used: Rolling walker (2 wheeled) Transfers: Sit to/from [facility] Sit to Stand: Min guard Stand pivot transfers: Supervision       General transfer comment: initially min guard from bed as first time pt up. S from toilet and ambulating back to chair.  Ambulation/Gait Ambulation/Gait assistance: Min guard Ambulation Distance (Feet): 40 Feet Assistive device: Rolling walker (2 wheeled) Gait Pattern/deviations: Step-through pattern;Drifts right/left;Trunk flexed;Wide base of support Gait velocity: decreased Gait velocity interpretation: Below normal speed for age/gender General Gait Details: patient steady with ambulation, RW for stability but as patient progressed she began picking up RW and carrying it as opposed to using for stability. Patient impulsive with in room ambulation. Min guard for Scientific laboratory technician    Modified Rankin (Stroke Patients Only)       Balance Overall balance assessment: Needs assistance Sitting-balance support: Feet supported Sitting balance-Leahy Scale: Good Sitting balance - Comments: could maintain balance in long sitting as well   Standing balance support: No upper extremity supported Standing balance-Leahy Scale: Good (able to stand on 1 foot when donning brief)                               Pertinent Vitals/Pain Pain Assessment: No/denies pain    Home Living Family/patient expects to be discharged to:: Private residence Living Arrangements: Children;Other relatives (Daughter [name], grandsons) Available Help at Discharge: Family;Available PRN/intermittently Type of Home: House Home Access: Stairs to enter Entrance Stairs-Rails: Right Entrance Stairs-Number of Steps: 3 Home Layout: One level Home Equipment: Walker - 2 wheels      Prior Function Level of Independence: Independent               Hand Dominance   Dominant Hand: Right    Extremity/Trunk Assessment   Upper Extremity Assessment: Overall WFL for tasks assessed           Lower Extremity Assessment: Defer to PT evaluation      Cervical / Trunk Assessment: Normal  Communication   Communication: No difficulties  Cognition Arousal/Alertness: Awake/alert Behavior During Therapy: Impulsive Overall Cognitive Status: No family/caregiver present to determine baseline cognitive functioning Area of Impairment: Attention;Safety/judgement;Awareness   Current Attention Level: Sustained (but very easily distracted)     Safety/Judgement: Decreased awareness of safety;Decreased awareness of deficits Awareness: Emergent   General Comments: Per chart,  pt is Bipolar. Unsure of baseline cognition. Pt tangential during evaluation but able to redidrect pt to topic.     General Comments      Exercises         Assessment/Plan    PT Assessment Patient needs continued PT services  PT Diagnosis Difficulty walking;Abnormality of gait;Generalized weakness   PT Problem List Decreased strength;Decreased activity tolerance;Decreased mobility;Obesity;Pain  PT Treatment Interventions DME instruction;Gait training;Stair training;Functional mobility training;Therapeutic activities;Therapeutic exercise;Balance training;Patient/family education   PT Goals (Current goals can be found in the Care Plan section) Acute Rehab PT Goals Patient Stated Goal: to sing in the program this weekend PT Goal Formulation: With patient Time For Goal Achievement: 07/28/16 Potential to Achieve Goals: Good    Frequency Min 3X/week   Barriers to discharge        Co-evaluation PT/OT/SLP Co-Evaluation/Treatment:  (dove tailed with OT)             End of Session Equipment Utilized During Treatment: Oxygen Activity Tolerance: Patient tolerated treatment well Patient left: in chair;with call bell/phone within reach;with chair alarm set;with family/visitor present Nurse Communication: Mobility status         Time: [id number] PT Time Calculation (min) (ACUTE ONLY): 18 min   Charges:   PT Evaluation $PT Eval Moderate Complexity: 1 Procedure     PT G CodesDuncan Dull 05-Aug-2016, 10:58 AM Alben Deeds, PT DPT  [phone]

## 2016-07-14 NOTE — Progress Notes (Signed)
Occupational Therapy Evaluation Patient Details Name: Tonya Hogan MRN: [id number] DOB: [birth year] Today's Date: 8/23/Hogan    History of Present Illness 65 y.o. female with CAD status post CABG, diastolic CHF, hypertension, bipolar disorder, COPD presents to the ER because of slurred speech.[facility] MRI -. atrial flutter with RVR   Clinical Impression   PTA, pt lived with her daughter. Pt discussed stressful dynamics with her daughter and plans to live with her sister instead. Pt tangential and impulsive during evaluation, but able to complete functional tasks at most likely close to her baseline level (no family present to confirm). Pt will be safe to D/C home with intermittent S when medically stable. OT signing off.     Follow Up Recommendations  No OT follow up;Supervision - Intermittent    Equipment Recommendations  None recommended by OT    Recommendations for Other [place] Work consult due to dynamics between daughter and pt     Precautions / Restrictions Precautions Precautions: Fall      Mobility Bed Mobility Overal bed mobility: Needs Assistance Bed Mobility: Supine to Sit     Supine to sit: Supervision     General bed mobility comments: supervision for safety, increased effort, VCs for positioning  Transfers Overall transfer level: Needs assistance Equipment used: Rolling walker (2 wheeled) Transfers: Sit to/from [facility] Sit to Stand: Min guard Stand pivot transfers: Supervision       General transfer comment: initially min guard from bed as first time pt up. S from toilet and ambulating back to chair.    Balance Overall balance assessment: Needs assistance Sitting-balance support: Feet supported Sitting balance-Leahy Scale: Good Sitting balance - Comments: could maintain balance in long sitting as well   Standing balance support: No upper extremity supported Standing balance-Leahy Scale: Good (able to stand on  1 foot when donning brief)                              ADL Overall ADL's : At baseline                                             Vision Vision Assessment?: No apparent visual deficits   Perception     Praxis Praxis Praxis tested?: Within functional limits    Pertinent Vitals/Pain Pain Assessment: No/denies pain     Hand Dominance Right   Extremity/Trunk Assessment Upper Extremity Assessment Upper Extremity Assessment: Overall WFL for tasks assessed   Lower Extremity Assessment Lower Extremity Assessment: Defer to PT evaluation   Cervical / Trunk Assessment Cervical / Trunk Assessment: Normal   Communication Communication Communication: No difficulties   Cognition Arousal/Alertness: Awake/alert Behavior During Therapy: Impulsive Overall Cognitive Status: No family/caregiver present to determine baseline cognitive functioning Area of Impairment: Attention;Safety/judgement;Awareness   Current Attention Level: Sustained (but very easily distracted)     Safety/Judgement: Decreased awareness of safety;Decreased awareness of deficits Awareness: Emergent   General Comments: Per chart, pt is Bipolar. Unsure of baseline cognition. Pt tangential during evaluation but able to redidrect pt to topic.    General Comments   Pt discussed how her daughter sent her to the mental hospital; takes money from her "check"; wears her "wigs"    Exercises       Shoulder Instructions      Home Living Family/patient  expects to be discharged to:: Private residence Living Arrangements: Children;Other relatives (Daughter Tonya Hogan, grandsons) Available Help at Discharge: Family;Available PRN/intermittently Type of Home: House Home Access: Stairs to enter [employer] of Steps: 3 Entrance Stairs-Rails: Right Home Layout: One level     Bathroom Shower/Tub: Teacher, early years/pre: Standard Bathroom Accessibility: No   Home  Equipment: Environmental consultant - 2 wheels          Prior Functioning/Environment Level of Independence: Independent             OT Diagnosis: Generalized weakness   OT Problem List: Decreased activity tolerance;Obesity   OT Treatment/Interventions:      OT Goals(Current goals can be found in the care plan section) Acute Rehab OT Goals Patient Stated Goal: to sing in the program this weekend OT Goal Formulation: All assessment and education complete, DC therapy  OT Frequency:     Barriers to D/C:            Co-evaluation              End of Session Equipment Utilized During Treatment: Rolling walker Nurse Communication: Mobility status  Activity Tolerance: Patient tolerated treatment well Patient left: in chair;with call bell/phone within reach;with chair alarm set   Time: [phone] OT Time Calculation (min): 27 min Charges:  OT General Charges $OT Visit: 1 Procedure OT Evaluation $OT Eval Moderate Complexity: 1 Procedure G-Codes:    Tonya Hogan,Tonya Hogan, 10:30 AM

## 2016-07-14 NOTE — Progress Notes (Signed)
STROKE TEAM PROGRESS NOTE   HISTORY OF PRESENT ILLNESS (per record) Tonya Hogan is an 65 y.o. female with a history of hypertension, CHF, CAD with CABG and bipolar disorder, presenting with chest discomfort, as well as history of right facial droop and slurred speech which started 4 days ago. She was noted to have atrial flutter in the ED. CT scan of her head showed no acute intracranial abnormality. She has not been on antiplatelet therapy. No weakness of extremities was reported. Patient indicates she has experienced a previous stroke, from which she recovered well. NIH stroke score at the time of this evaluation was 0. She was LKW on 07/09/2016, time unclear. Patient was not administered IV t-PA secondary to deficits resolved. She was admitted for further evaluation and treatment.   SUBJECTIVE (INTERVAL HISTORY) Her pastor is at the bedside.  Overall she feels her condition is stable. She states this all started a few days ago when her daughter made her so mad, trying to put her in a mental hospital. "All my family has bipolar, no need to be in a hospital for it". Cardiology, Rosaria Ferries, leaving room as we entered.    OBJECTIVE Temp:  [97.4 F (36.3 C)-98.8 F (37.1 C)] 97.4 F (36.3 C) (08/23 0812) Pulse Rate:  [117-137] 120 (08/23 0342) Cardiac Rhythm: Atrial flutter (08/23 0700) Resp:  [15-28] 24 (08/23 0342) BP: (85-136)/(58-99) 100/73 (08/23 0342) SpO2:  [88 %-99 %] 95 % (08/23 0342) Weight:  [117.9 kg (260 lb)] 117.9 kg (260 lb) (08/22 1315)  CBC:   Recent Labs Lab 07/13/16 1335 07/14/16 0607  WBC 6.6 5.9  NEUTROABS 3.9  --   HGB 9.1* 8.8*  HCT 30.8* 30.6*  MCV 85.6 86.2  PLT 298 [id number]    Basic Metabolic Panel:   Recent Labs Lab 07/13/16 1335 07/13/16 2206 07/14/16 0607  NA 140  --  139  K 4.3  --  3.9  CL 99*  --  96*  CO2 34*  --  37*  GLUCOSE 134*  --  100*  BUN 32*  --  19  CREATININE 0.94  --  0.88  CALCIUM 8.8*  --  8.6*  MG 1.8 1.9  --      Lipid Panel:     Component Value Date/Time   CHOL 190 07/14/2016 0324   TRIG 67 07/14/2016 0324   HDL 49 07/14/2016 0324   CHOLHDL 3.9 07/14/2016 0324   VLDL 13 07/14/2016 0324   LDLCALC 128 (H) 07/14/2016 0324   HgbA1c:  Lab Results  Component Value Date   HGBA1C 5.4 10/07/2015   Urine Drug Screen:     Component Value Date/Time   LABOPIA NONE DETECTED 07/13/2016 1530   COCAINSCRNUR NONE DETECTED 07/13/2016 1530   LABBENZ NONE DETECTED 07/13/2016 1530   AMPHETMU NONE DETECTED 07/13/2016 1530   THCU NONE DETECTED 07/13/2016 1530   LABBARB NONE DETECTED 07/13/2016 1530      IMAGING  Dg Chest 2 View  Result Date: 07/13/2016 CLINICAL DATA:  Slurred speech. EXAM: CHEST  2 VIEW COMPARISON:  Radiographs of April 30, 2016. FINDINGS: Stable cardiomediastinal silhouette. Sternotomy wires are noted. No pneumothorax or pleural effusion is noted. Bony thorax is unremarkable. Slightly improved linear densities are noted in the perihilar regions consistent with improving subsegmental atelectasis. IMPRESSION: Mildly improved bilateral parahilar subsegmental atelectasis. Electronically Signed   By: Marijo Conception, M.D.   On: 07/13/2016 14:56   Ct Head Wo Contrast  Result Date: 07/13/2016 CLINICAL DATA:  Slurred  speech beginning last night. EXAM: CT HEAD WITHOUT CONTRAST TECHNIQUE: Contiguous axial images were obtained from the base of the skull through the vertex without intravenous contrast. COMPARISON:  MRI 10/07/2015 FINDINGS: Brain: No acute intracranial abnormality. Specifically, no hemorrhage, hydrocephalus, mass lesion, acute infarction, or significant intracranial injury. Vascular: No hyperdense vessel or unexpected calcification. Skull: No acute calvarial abnormality Sinuses/Orbits: Visualized paranasal sinuses and mastoids clear. Orbital soft tissues unremarkable. Other: None IMPRESSION: No acute intracranial abnormality. Electronically Signed   By: Rolm Baptise M.D.   On: 07/13/2016  14:45   Ct Angio Chest Pe W And/or Wo Contrast  Result Date: 07/13/2016 CLINICAL DATA:  Tachycardia, shoulder pain. EXAM: CT ANGIOGRAPHY CHEST WITH CONTRAST TECHNIQUE: Multidetector CT imaging of the chest was performed using the standard protocol during bolus administration of intravenous contrast. Multiplanar CT image reconstructions and MIPs were obtained to evaluate the vascular anatomy. CONTRAST:  100 mL of Isovue 370 intravenously. COMPARISON:  CT scan of June 22, 2015. FINDINGS: No pneumothorax or pleural effusion is noted. Mild bibasilar subsegmental atelectasis is noted. There is no evidence of large central pulmonary embolus. However, due to limitation in terms of timing of contrast bolus, smaller peripheral emboli in lower lobe branches cannot be excluded on the basis of this exam. Status post coronary artery bypass graft. Atherosclerosis of thoracic aorta is noted without aneurysm or dissection. Visualized portion of upper abdomen is unremarkable. No mediastinal mass or adenopathy is noted. Review of the MIP images confirms the above findings. IMPRESSION: Aortic atherosclerosis. Mild bibasilar subsegmental atelectasis. No evidence of large central pulmonary embolus. However, due to limitations of contrast bolus, smaller peripheral emboli in lower lobe branches cannot be excluded on the basis of this exam. Electronically Signed   By: Marijo Conception, M.D.   On: 07/13/2016 17:11   Mr Brain Wo Contrast  Result Date: 07/14/2016 CLINICAL DATA:  65 y/o F; presenting with right facial droop and slurred speech beginning 4 days ago. EXAM: MRI HEAD WITHOUT CONTRAST MRA HEAD WITHOUT CONTRAST TECHNIQUE: Multiplanar, multiecho pulse sequences of the brain and surrounding structures were obtained without intravenous contrast. Angiographic images of the head were obtained using MRA technique without contrast. COMPARISON:  CT of head dated 07/13/2016.  MRI brain 10/07/2015. FINDINGS: MRI HEAD FINDINGS Brain: No  diffusion restriction to suggest acute infarct. There are punctate foci of susceptibility hypointensity within the cerebellum and bilateral frontal and parietal subcortical white matter without significant interval change compatible with prior microhemorrhage. Motion artifact partially degrades the FLAIR sequence. 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 The left parietal and temporal infarcts have mild foraminal 7 staining. No focal mass effect. Mild parenchymal atrophy. Extra-axial space: No hydrocephalus. No midline shift. No effacement of basilar cisterns. No extra-axial collection is identified. Proximal intracranial flow voids are maintained. No abnormality of the cervical medullary junction. Other: No abnormal signal of the paranasal sinuses. Partial opacification of right mastoid air cells. Orbits are unremarkable. Calvarium is unremarkable. 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 Anterior cerebral arteries:  Patent. Middle cerebral arteries: Patent. Anterior communicating artery: Patent. Posterior communicating arteries:  Patent. Posterior cerebral arteries: Patent bilaterally. Small left P1 segment with large left posterior communicating artery consistent with fetal circulation, normal variant. Basilar artery:  Patent. Vertebral arteries:  Patent.  Left dominant. No evidence of  significant stenosis or large vessel occlusion. IMPRESSION: 1. No evidence of acute/early subacute infarct or new intracranial hemorrhage. 2. 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 3. Stable parenchymal volume loss and chronic infarcts within left temporal, left parietal, and right cerebellum. 4. No significant stenosis or large vessel occlusion of the circle of  Willis. These results will be called to the ordering clinician or representative by the Radiologist Assistant, and communication documented in the PACS or zVision Dashboard. Electronically Signed   By: Kristine Garbe M.D.   On: 07/14/2016 01:58   Mr Jodene Nam Head/brain [id number] Cm  Result Date: 07/14/2016 CLINICAL DATA:  65 y/o F; presenting with right facial droop and slurred speech beginning 4 days ago. EXAM: MRI HEAD WITHOUT CONTRAST MRA HEAD WITHOUT CONTRAST TECHNIQUE: Multiplanar, multiecho pulse sequences of the brain and surrounding structures were obtained without intravenous contrast. Angiographic images of the head were obtained using MRA technique without contrast. COMPARISON:  CT of head dated 07/13/2016.  MRI brain 10/07/2015. FINDINGS: MRI HEAD FINDINGS Brain: No diffusion restriction to suggest acute infarct. There are punctate foci of susceptibility hypointensity within the cerebellum and bilateral frontal and parietal subcortical white matter without significant interval change compatible with prior microhemorrhage. Motion artifact partially degrades the FLAIR sequence. 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 The left parietal and temporal infarcts have mild foraminal 7 staining. No focal mass effect. Mild parenchymal atrophy. Extra-axial space: No hydrocephalus. No midline shift. No effacement of basilar cisterns. No extra-axial collection is identified. Proximal intracranial flow voids are maintained. No abnormality of the cervical medullary junction. Other: No abnormal signal of the paranasal sinuses. Partial opacification of right mastoid air cells. Orbits are unremarkable. Calvarium is unremarkable. 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 Anterior cerebral arteries:   Patent. Middle cerebral arteries: Patent. Anterior communicating artery: Patent. Posterior communicating arteries:  Patent. Posterior cerebral arteries: Patent bilaterally. Small left P1 segment with large left posterior communicating artery consistent with fetal circulation, normal variant. Basilar artery:  Patent. Vertebral arteries:  Patent.  Left dominant. No evidence of significant stenosis or large vessel occlusion. IMPRESSION: 1. No evidence of acute/early subacute infarct or new intracranial hemorrhage. 2. 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 3. Stable parenchymal volume loss and chronic infarcts within left temporal, left parietal, and right cerebellum. 4. No significant stenosis or large vessel occlusion of the circle of Willis. These results will be called to the ordering clinician or representative by the Radiologist Assistant, and communication documented in the PACS or zVision Dashboard. Electronically Signed   By: Kristine Garbe M.D.   On: 07/14/2016 01:58    PHYSICAL EXAM  Pleasant obese middle-aged African-American lady currently not in distress. Sitting up in the bedside chair. . Afebrile. Head is nontraumatic. Neck is supple without bruit.    Cardiac exam no murmur or gallop. Lungs are clear to auscultation. Distal pulses are well felt.  Neurological Exam :  Awake alert oriented to time place and person. No aphasia or dysarthria but slightly pressured speech. Pupils equal reactive. Fundi were not visualized. Vision acuity seems adequate. Blinks to threat bilaterally. Mild right lower facial asymmetry on smiling. Tongue midline. Motor system exam no upper or lower eczema to drift. No focal weakness. Deep tendon reflex  1+ symmetric. Ankle jerks are depressed. Plantars downgoing. Sensation appears preserved bilaterally. Gait was not tested. ASSESSMENT/PLAN Tonya Hogan is a 65 y.o. female with history of hypertension, CHF,  CAD with CABG and bipolar disorder, presenting with chest discomfort and right facial droop and slurred speech which started 4 days ago. She did not receive IV t-PA due to deficits resolved.   L brain TIA, embolic secondary to atrial flutter  MRI  No acute stroke  MRA  R ICA broad based aneurysm, no significant stenosis  Carotid Doppler  pending   2D Echo  pending   LDL 128  HgbA1c pending  IV heparin for VTE prophylaxis Diet Heart Room service appropriate? Yes; Fluid consistency: Thin  No antithrombotic prior to admission, now on heparin IV  Patient counseled to be compliant with her antithrombotic medications  Ongoing aggressive stroke risk factor management  Therapy recommendations:  No OT, other therapy evals pending   Disposition:  pending   Atrial Flutter w/ RVR  New diagnosis  Started on IV heparin and cardizem drip  Recommend NOAC treatment for secondary stroke prevention    Hypertension  BP running low in hospital  Long-term BP goal normotensive  Hyperlipidemia  Home meds:  lipitor 20, resumed in hospital  LDL 128, goal < 70  Continue statin at discharge  Other Stroke Risk Factors  Morbid Obesity, Body mass index is 50.78 kg/m., recommend weight loss, diet and exercise as appropriate   Coronary artery disease s/p CABG  Chronic diastolic CHF  Other Active Problems  COPD  Hypothyroidism on synthroid  Chronic anemia 8.8  MRSA  Bipolar disorder  Hospital day # [place] for Pager information 07/14/2016 10:47 AM  I have personally examined this patient, reviewed notes, independently viewed imaging studies, participated in medical decision making and plan of care. I have made any additions or clarifications directly to the above note. Agree with note above.  She presented with right facial droop and slurred speech for several days with new onset atrial flutter likely embolic left hemispheric TIA.  She remains at risk for neurological worsening, recurrent strokes and  TIAs and likely needs long-term anticoagulation Recommend switching heparin to oral anticoagulation when okay with cardiology. Greater than 50% time during this 35 minute visit was spent on counseling and coordination of care about stroke risk from atrial flutter, risk prevention and treatment  Antony Contras, MD Medical Director [facility] Pager: [phone] 07/14/2016 1:33 PM    To contact Stroke Continuity provider, please refer to [email or web address] After hours, contact General Neurology

## 2016-07-14 NOTE — Discharge Instructions (Signed)
Information on my medicine - XARELTO (Rivaroxaban)  This medication education was reviewed with me or my healthcare representative as part of my discharge preparation.  The pharmacist that spoke with me during my hospital stay was:  Romona Curls, [facility]  Why was Xarelto prescribed for you? Xarelto was prescribed for you to reduce the risk of a blood clot forming that can cause a stroke if you have a medical condition called atrial fibrillation (a type of irregular heartbeat).  What do you need to know about xarelto ? Take your Xarelto ONCE DAILY at the same time every day with your evening meal. If you have difficulty swallowing the tablet whole, you may crush it and mix in applesauce just prior to taking your dose.  Take Xarelto exactly as prescribed by your doctor and DO NOT stop taking Xarelto without talking to the doctor who prescribed the medication.  Stopping without other stroke prevention medication to take the place of Xarelto may increase your risk of developing a clot that causes a stroke.  Refill your prescription before you run out.  After discharge, you should have regular check-up appointments with your healthcare provider that is prescribing your Xarelto.  In the future your dose may need to be changed if your kidney function or weight changes by a significant amount.  What do you do if you miss a dose? If you are taking Xarelto ONCE DAILY and you miss a dose, take it as soon as you remember on the same day then continue your regularly scheduled once daily regimen the next day. Do not take two doses of Xarelto at the same time or on the same day.   Important Safety Information A possible side effect of Xarelto is bleeding. You should call your healthcare provider right away if you experience any of the following: ? Bleeding from an injury or your nose that does not stop. ? Unusual colored urine (red or dark brown) or unusual colored stools (red or black). ? Unusual  bruising for unknown reasons. ? A serious fall or if you hit your head (even if there is no bleeding).  Some medicines may interact with Xarelto and might increase your risk of bleeding while on Xarelto. To help avoid this, consult your healthcare provider or pharmacist prior to using any new prescription or non-prescription medications, including herbals, vitamins, non-steroidal anti-inflammatory drugs (NSAIDs) and supplements.  This website has more information on Xarelto: [email or web address]

## 2016-07-15 ENCOUNTER — Inpatient Hospital Stay (HOSPITAL_COMMUNITY): Payer: Medicare Other

## 2016-07-15 DIAGNOSIS — E039 Hypothyroidism, unspecified: Secondary | ICD-10-CM

## 2016-07-15 DIAGNOSIS — I4892 Unspecified atrial flutter: Secondary | ICD-10-CM

## 2016-07-15 LAB — HEMOGLOBIN A1C
HEMOGLOBIN A1C: 6 % — AB (ref 4.8–5.6)
MEAN PLASMA GLUCOSE: 126 mg/dL

## 2016-07-15 LAB — VAS US CAROTID
LCCAPDIAS: -23 cm/s
LCCAPSYS: -64 cm/s
LEFT ECA DIAS: 15 cm/s
LEFT VERTEBRAL DIAS: 27 cm/s
LICADDIAS: 35 cm/s
LICAPSYS: 79 cm/s
Left CCA dist dias: -15 cm/s
Left CCA dist sys: -53 cm/s
Left ICA dist sys: 69 cm/s
Left ICA prox dias: 35 cm/s
RCCAPDIAS: -24 cm/s
RIGHT ECA DIAS: 7 cm/s
RIGHT VERTEBRAL DIAS: -15 cm/s
Right CCA prox sys: -67 cm/s
Right cca dist sys: -60 cm/s

## 2016-07-15 NOTE — Progress Notes (Signed)
PROGRESS NOTE  Subjective:   65 y.o. year old female with a history of HTN, CABG 01/31/2010 at [facility], bipolar, obesity, COPD, chronic anemia, D-CHF  with EF 55-60% and grade 2 dd, thyroid CA>>  Admitted with 4 day hx of slurred speech and was thought to have had a CVA Was found to have atrial flutter  Is on amio And xarelto  Objective:    Vital Signs:   Temp:  [97.5 F (36.4 C)-98.3 F (36.8 C)] 97.9 F (36.6 C) (08/24 1129) Pulse Rate:  [111-116] 116 (08/24 1129) Resp:  [19-22] 22 (08/24 1129) BP: (85-136)/(50-124) 94/61 (08/24 1129) SpO2:  [93 %-96 %] 93 % (08/24 1129)  Last BM Date: 07/13/16   24-hour weight change: Weight change:   Weight trends: Filed Weights   07/13/16 1315  Weight: 260 lb (117.9 kg)    Intake/Output:  08/23 0701 - 08/24 0700 In: 903 [P.O.:200; I.V.:703] Out: -  No intake/output data recorded.   Physical Exam: BP 94/61 (BP Location: Right Arm)   Pulse (!) 116   Temp 97.9 F (36.6 C) (Oral)   Resp (!) 22   Ht 5' (1.524 m)   Wt 260 lb (117.9 kg)   SpO2 93%   BMI 50.78 kg/m   Wt Readings from Last 3 Encounters:  07/13/16 260 lb (117.9 kg)  11/05/15 257 lb 6.4 oz (116.8 kg)  10/21/15 258 lb (117 kg)    General: Vital signs reviewed and noted.   Head: Normocephalic, atraumatic.  Eyes: conjunctivae/corneas clear.  EOM's intact.   Throat: normal  Neck:  normal   Lungs:    clear   Heart:  RR, tachy  Abdomen:  Soft, non-tender, non-distended  , obeste  Extremities: No c/c/e   Neurologic: A&O X3, CN II - XII are grossly intact.   Psych: Normal     Labs: BMET:  Recent Labs  07/13/16 1335 07/13/16 2206 07/14/16 0607  NA 140  --  139  K 4.3  --  3.9  CL 99*  --  96*  CO2 34*  --  37*  GLUCOSE 134*  --  100*  BUN 32*  --  19  CREATININE 0.94  --  0.88  CALCIUM 8.8*  --  8.6*  MG 1.8 1.9  --     Liver function tests:  Recent Labs  07/13/16 1335 07/14/16 0607  AST 11* 11*  ALT 10* 9*  ALKPHOS 49 51    BILITOT 0.2* 0.3  PROT 7.7 7.0  ALBUMIN 2.9* 2.7*   No results for input(s): LIPASE, AMYLASE in the last 72 hours.  CBC:  Recent Labs  07/13/16 1335 07/14/16 0607  WBC 6.6 5.9  NEUTROABS 3.9  --   HGB 9.1* 8.8*  HCT 30.8* 30.6*  MCV 85.6 86.2  PLT 298 280    Cardiac Enzymes:  Recent Labs  07/13/16 1335 07/13/16 2206 07/14/16 0324 07/14/16 0607  TROPONINI <0.03 <0.03 <0.03 <0.03    Coagulation Studies: No results for input(s): LABPROT, INR in the last 72 hours.  Other: Invalid input(s): POCBNP No results for input(s): DDIMER in the last 72 hours.  Recent Labs  07/14/16 0324  HGBA1C 6.0*    Recent Labs  07/14/16 0324  CHOL 190  HDL 49  LDLCALC 128*  TRIG 67  CHOLHDL 3.9    Recent Labs  07/13/16 2159  TSH 0.535   No results for input(s): VITAMINB12, FOLATE, FERRITIN, TIBC, IRON, RETICCTPCT in the last 72 hours.  Other results:  Tele   ( personally reviewed )  - atrial flutter with 2:1 conduction    Medications:    Infusions: . sodium chloride 10 mL/hr at 07/15/16 0700  . sodium chloride    . amiodarone 30 mg/hr (07/15/16 0700)    Scheduled Medications: .  stroke: mapping our early stages of recovery book   Does not apply Once  . aspirin  300 mg Rectal Daily   Or  . aspirin  325 mg Oral Daily  . atorvastatin  20 mg Oral q1800  . Chlorhexidine Gluconate Cloth  6 each Topical Q0600  . divalproex  1,000 mg Oral BID  . gabapentin  600 mg Oral BID  . levothyroxine  125 mcg Oral QAC breakfast  . mupirocin ointment  1 application Nasal BID  . omega-3 acid ethyl esters  1 g Oral Daily  . risperiDONE  1 mg Oral QHS  . rivaroxaban  20 mg Oral Q supper    Assessment/ Plan:   Principal Problem:   Atrial flutter with rapid ventricular response (HCC) Active Problems:   Essential hypertension   COPD (chronic obstructive pulmonary disease) (HCC)   Facial droop   Hypothyroidism   S/P CABG x 3   Chronic diastolic heart failure  (HCC)   Slurred speech   Cerebral thrombosis with cerebral infarction   TIA (transient ischemic attack)   Morbid obesity due to excess calories (HCC)   Chronic systolic congestive heart failure ([place])  1. Atrial flutter:   Still tachycardic She has been on xarelto for 2 days. Will do a TEE guided cardioversion tomorrow  Will keep npo after midnight      Disposition:  Length of Stay: 2  Ramond Dial., MD, [facility] 07/15/2016, 11:39 AM Office [phone] Pager [phone]

## 2016-07-15 NOTE — Progress Notes (Addendum)
STROKE TEAM PROGRESS NOTE   SUBJECTIVE (INTERVAL HISTORY) Patient speaking to family member on the phone (daughter) - asked Dr. Leonie Man to speak with them as well. Patient sitting on the side of the bed, no new complaints.   OBJECTIVE Temp:  [97.5 F (36.4 C)-98.3 F (36.8 C)] 98.3 F (36.8 C) (08/24 0820) Pulse Rate:  [111-116] 116 (08/24 0820) Cardiac Rhythm: Atrial flutter (08/24 0726) Resp:  [19-21] 21 (08/24 0820) BP: (85-136)/(50-124) 85/50 (08/24 0820) SpO2:  [93 %-96 %] 95 % (08/24 0820)  CBC:   Recent Labs Lab 07/13/16 1335 07/14/16 0607  WBC 6.6 5.9  NEUTROABS 3.9  --   HGB 9.1* 8.8*  HCT 30.8* 30.6*  MCV 85.6 86.2  PLT 298 [id number]    Basic Metabolic Panel:   Recent Labs Lab 07/13/16 1335 07/13/16 2206 07/14/16 0607  NA 140  --  139  K 4.3  --  3.9  CL 99*  --  96*  CO2 34*  --  37*  GLUCOSE 134*  --  100*  BUN 32*  --  19  CREATININE 0.94  --  0.88  CALCIUM 8.8*  --  8.6*  MG 1.8 1.9  --     Lipid Panel:     Component Value Date/Time   CHOL 190 07/14/2016 0324   TRIG 67 07/14/2016 0324   HDL 49 07/14/2016 0324   CHOLHDL 3.9 07/14/2016 0324   VLDL 13 07/14/2016 0324   LDLCALC 128 (H) 07/14/2016 0324   HgbA1c:  Lab Results  Component Value Date   HGBA1C 6.0 (H) 07/14/2016   Urine Drug Screen:     Component Value Date/Time   LABOPIA NONE DETECTED 07/13/2016 1530   COCAINSCRNUR NONE DETECTED 07/13/2016 1530   LABBENZ NONE DETECTED 07/13/2016 1530   AMPHETMU NONE DETECTED 07/13/2016 1530   THCU NONE DETECTED 07/13/2016 1530   LABBARB NONE DETECTED 07/13/2016 1530     IMAGING  Dg Chest 2 View  Result Date: 07/13/2016 CLINICAL DATA:  Slurred speech. EXAM: CHEST  2 VIEW COMPARISON:  Radiographs of April 30, 2016. FINDINGS: Stable cardiomediastinal silhouette. Sternotomy wires are noted. No pneumothorax or pleural effusion is noted. Bony thorax is unremarkable. Slightly improved linear densities are noted in the perihilar regions consistent  with improving subsegmental atelectasis. IMPRESSION: Mildly improved bilateral parahilar subsegmental atelectasis. Electronically Signed   By: Marijo Conception, M.D.   On: 07/13/2016 14:56   Ct Head Wo Contrast  Result Date: 07/13/2016 CLINICAL DATA:  Slurred speech beginning last night. EXAM: CT HEAD WITHOUT CONTRAST TECHNIQUE: Contiguous axial images were obtained from the base of the skull through the vertex without intravenous contrast. COMPARISON:  MRI 10/07/2015 FINDINGS: Brain: No acute intracranial abnormality. Specifically, no hemorrhage, hydrocephalus, mass lesion, acute infarction, or significant intracranial injury. Vascular: No hyperdense vessel or unexpected calcification. Skull: No acute calvarial abnormality Sinuses/Orbits: Visualized paranasal sinuses and mastoids clear. Orbital soft tissues unremarkable. Other: None IMPRESSION: No acute intracranial abnormality. Electronically Signed   By: Rolm Baptise M.D.   On: 07/13/2016 14:45   Ct Angio Chest Pe W And/or Wo Contrast  Result Date: 07/13/2016 CLINICAL DATA:  Tachycardia, shoulder pain. EXAM: CT ANGIOGRAPHY CHEST WITH CONTRAST TECHNIQUE: Multidetector CT imaging of the chest was performed using the standard protocol during bolus administration of intravenous contrast. Multiplanar CT image reconstructions and MIPs were obtained to evaluate the vascular anatomy. CONTRAST:  100 mL of Isovue 370 intravenously. COMPARISON:  CT scan of June 22, 2015. FINDINGS: No pneumothorax or pleural  effusion is noted. Mild bibasilar subsegmental atelectasis is noted. There is no evidence of large central pulmonary embolus. However, due to limitation in terms of timing of contrast bolus, smaller peripheral emboli in lower lobe branches cannot be excluded on the basis of this exam. Status post coronary artery bypass graft. Atherosclerosis of thoracic aorta is noted without aneurysm or dissection. Visualized portion of upper abdomen is unremarkable. No  mediastinal mass or adenopathy is noted. Review of the MIP images confirms the above findings. IMPRESSION: Aortic atherosclerosis. Mild bibasilar subsegmental atelectasis. No evidence of large central pulmonary embolus. However, due to limitations of contrast bolus, smaller peripheral emboli in lower lobe branches cannot be excluded on the basis of this exam. Electronically Signed   By: Marijo Conception, M.D.   On: 07/13/2016 17:11   Mr Brain Wo Contrast  Result Date: 07/14/2016 CLINICAL DATA:  65 y/o F; presenting with right facial droop and slurred speech beginning 4 days ago. EXAM: MRI HEAD WITHOUT CONTRAST MRA HEAD WITHOUT CONTRAST TECHNIQUE: Multiplanar, multiecho pulse sequences of the brain and surrounding structures were obtained without intravenous contrast. Angiographic images of the head were obtained using MRA technique without contrast. COMPARISON:  CT of head dated 07/13/2016.  MRI brain 10/07/2015. FINDINGS: MRI HEAD FINDINGS Brain: No diffusion restriction to suggest acute infarct. There are punctate foci of susceptibility hypointensity within the cerebellum and bilateral frontal and parietal subcortical white matter without significant interval change compatible with prior microhemorrhage. Motion artifact partially degrades the FLAIR sequence. 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 The left parietal and temporal infarcts have mild foraminal 7 staining. No focal mass effect. Mild parenchymal atrophy. Extra-axial space: No hydrocephalus. No midline shift. No effacement of basilar cisterns. No extra-axial collection is identified. Proximal intracranial flow voids are maintained. No abnormality of the cervical medullary junction. Other: No abnormal signal of the paranasal sinuses. Partial opacification of right mastoid air cells. Orbits are unremarkable. Calvarium is unremarkable. MRA HEAD FINDINGS Internal carotid  arteries: Broad base posteriorly directed aneurysm of proximal right cavernous segment measuring 4 mm at the base and 5 mm to dome, series 5, image 111. Anterior cerebral arteries:  Patent. Middle cerebral arteries: Patent. Anterior communicating artery: Patent. Posterior communicating arteries:  Patent. Posterior cerebral arteries: Patent bilaterally. Small left P1 segment with large left posterior communicating artery consistent with fetal circulation, normal variant. Basilar artery:  Patent. Vertebral arteries:  Patent.  Left dominant. No evidence of significant stenosis or large vessel occlusion. IMPRESSION: 1. No evidence of acute/early subacute infarct or new intracranial hemorrhage. 2. 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 3. Stable parenchymal volume loss and chronic infarcts within left temporal, left parietal, and right cerebellum. 4. No significant stenosis or large vessel occlusion of the circle of Willis. These results will be called to the ordering clinician or representative by the Radiologist Assistant, and communication documented in the PACS or zVision Dashboard. Electronically Signed   By: Kristine Garbe M.D.   On: 07/14/2016 01:58   Mr Jodene Nam Head/brain [id number] Cm  Result Date: 07/14/2016 CLINICAL DATA:  65 y/o F; presenting with right facial droop and slurred speech beginning 4 days ago. EXAM: MRI HEAD WITHOUT CONTRAST MRA HEAD WITHOUT CONTRAST TECHNIQUE: Multiplanar, multiecho pulse sequences of the brain and surrounding structures were obtained without intravenous contrast. Angiographic images of the head were obtained using MRA technique without contrast. COMPARISON:  CT of head dated 07/13/2016.  MRI brain 10/07/2015. FINDINGS: MRI HEAD FINDINGS Brain: No diffusion restriction to suggest acute infarct. There are punctate foci of susceptibility hypointensity within the cerebellum and bilateral frontal and parietal subcortical white  matter without significant interval change compatible with prior microhemorrhage. Motion artifact partially degrades the FLAIR sequence. 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 The left parietal and temporal infarcts have mild foraminal 7 staining. No focal mass effect. Mild parenchymal atrophy. Extra-axial space: No hydrocephalus. No midline shift. No effacement of basilar cisterns. No extra-axial collection is identified. Proximal intracranial flow voids are maintained. No abnormality of the cervical medullary junction. Other: No abnormal signal of the paranasal sinuses. Partial opacification of right mastoid air cells. Orbits are unremarkable. Calvarium is unremarkable. 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 Anterior cerebral arteries:  Patent. Middle cerebral arteries: Patent. Anterior communicating artery: Patent. Posterior communicating arteries:  Patent. Posterior cerebral arteries: Patent bilaterally. Small left P1 segment with large left posterior communicating artery consistent with fetal circulation, normal variant. Basilar artery:  Patent. Vertebral arteries:  Patent.  Left dominant. No evidence of significant stenosis or large vessel occlusion. IMPRESSION: 1. No evidence of acute/early subacute infarct or new intracranial hemorrhage. 2. 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 3. Stable parenchymal volume loss and chronic infarcts within left temporal, left parietal, and right cerebellum. 4. No significant stenosis or large vessel occlusion of the circle of Willis. These results will be called to the ordering clinician or representative by the Radiologist Assistant, and communication documented in the PACS or zVision Dashboard.  Electronically Signed   By: Kristine Garbe M.D.   On: 07/14/2016 01:58   Carotid Doppler   Left ventricle:  The echo is technically difficult due to the patients body habitus. Definity was used to visualize the LV and showed that the LV systolic function is at least moderately reduced. Poorly visualized. The cavity size was normal. Wall thickness was normal. Systolic function was moderately to severely reduced. The estimated ejection fraction was in the range of 30% to 35%. The study was not technically sufficient to allow evaluation of LV diastolic dysfunction due to atrial fibrillation.   PHYSICAL EXAM  Pleasant obese middle-aged African-American lady currently not in distress. Sitting up in the bedside chair. . Afebrile. Head is nontraumatic. Neck is supple without bruit.    Cardiac exam no murmur or gallop. Lungs are clear to auscultation. Distal pulses are well felt.  Neurological Exam :  Awake alert oriented to time place and person. No aphasia or dysarthria but slightly pressured speech. Pupils equal reactive. Fundi were not visualized. Vision acuity seems adequate. Blinks to threat bilaterally. Mild right lower facial asymmetry on smiling. Tongue midline. Motor system exam no upper or lower eczema to drift. No focal weakness. Deep tendon reflex 1+ symmetric. Ankle jerks are depressed. Plantars downgoing. Sensation appears preserved bilaterally. Gait was not tested.   ASSESSMENT/PLAN Tonya Hogan is a 65 y.o. female with history of hypertension, CHF, CAD with CABG and bipolar disorder, presenting with chest discomfort and right facial droop and slurred speech which started 4 days ago. She did not receive IV t-PA due to deficits resolved.   L brain TIA, embolic secondary to atrial flutter  MRI  No acute stroke  MRA  R ICA broad based  aneurysm, no significant stenosis  Carotid Doppler  No significant stenosis   2D Echo  EF 30-35%. No source of embolus   LDL  128  HgbA1c 6.0  IV heparin for VTE prophylaxis Diet heart healthy/carb modified Room service appropriate? Yes; Fluid consistency: Thin Diet NPO time specified  No antithrombotic prior to admission, transitioned from  heparin IV to xarelto  Patient counseled to be compliant with her antithrombotic medications  Ongoing aggressive stroke risk factor management  Therapy recommendations:  No OT, no PT  Disposition:  Anticipate return home  Atrial Flutter w/ RVR  New diagnosis  Transitioned from IV heparin to xarelto  Plans TEE and cardioversion on Friday    Hypertension  BP running low in hospital Long-term BP goal normotensive  Hyperlipidemia  Home meds:  lipitor 20, resumed in hospital  LDL 128, goal < 70  Continue statin at discharge  Other Stroke Risk Factors  Morbid Obesity, Body mass index is 50.78 kg/m., recommend weight loss, diet and exercise as appropriate   Coronary artery disease s/p CABG  Chronic diastolic CHF  Other Active Problems  COPD  Hypothyroidism on synthroid  Chronic anemia 8.8  MRSA  Bipolar disorder  Hospital day # [place] for Pager information 07/15/2016 10:12 AM  I have personally examined this patient, reviewed notes, independently viewed imaging studies, participated in medical decision making and plan of care. I have made any additions or clarifications directly to the above note. Agree with note above.  Continue heparin and TEE cardioversion plan as per cardiology. Discharge home on Xarelto. Discussed with patient and answered questions. Greater than 50% time during this 25 minute visit was spent on counseling and coordination of care about stroke and atrial flutter risk, prevention and treatment.Stroke team will sign off. Call for questions. F/u stroke clinic in 2 months.  Antony Contras, MD Medical Director [facility] Pager: [phone] 07/15/2016 1:52 PM  To  contact Stroke Continuity provider, please refer to [email or web address] After hours, contact General Neurology

## 2016-07-15 NOTE — Progress Notes (Signed)
**  Preliminary report by tech**  Carotid duplex completed. Findings are consistent with a 1-39 percent stenosis involving the right internal carotid artery and the left internal carotid artery. The vertebral arteries demonstrate antegrade flow. Limited and difficult study due to patient body habitus, movement, depth of vessels, tachycardia, vessel tortuosity, respiratory interference, acoustic shadowing, and poor patient cooperation.  07/15/16 12:58 PM Tonya Hogan RVT

## 2016-07-15 NOTE — Progress Notes (Signed)
[facility] PROGRESS NOTE  Cherida Shemwell [id number] DOB: 1951-04-13 DOA: 07/13/2016 PCP: No PCP Per Patient  Interim summary and HPI 65 y.o. female with CAD status post CABG, diastolic CHF, hypertension, bipolar disorder, COPD presents to the ER because of slurred speech. Patient has been having difficulty with speech since yesterday morning. Denies any weakness of the upper or lower extremity. Denies any visual symptoms or difficulty swallowing. While in the ER patient is found to be in atrial flutter with RVR. Patient was initially started on Cardizem infusion following which patient blood pressure started to drop. On my exam patient is still in atrial flutter with RVR. Patient still has slurring of the speech. CT of the head and CT angiogram of the chest was unremarkable.  Assessment/Plan: 1-TIA: with symptoms resolution now -MRI w/o stroke, MRA with R ICA based aneurysm, no significant stenosis  -2-D echo: w/o source of emboli appreciated; reduced EF. TEE pending  -will complete workup (carotid dopplers and A1C pending) -appears to be secondary to Atrial Flutter -secondary prevention with xarelto -LDL 128, will continue statins -continue risk factors modifications   2-Atrial flutter with RVR: -cardiology consulted -will continue amiodarone drip -plans are for TEE and cardioversion on Friday 8/25 -xarelto for anticoagulation -rate slightly better, but still on A. fib  3-HTN: -stable currently; will continue current medication regimen   4-HLD: -continue statins  5-morbid obesity: -Body mass index is 50.78 kg/m. -low calorie diet and exercise discussed with patient  6- hypothyroidism: -continue synthroid  7-chronic systolic heart failure: EF 30-35% -appears compensated; even difficult to assess due to body habitus -will follow cardiology rec's regarding medication management   8-COPD: -no wheezing currently -will continue as needed bronchodilators   Code  Status: Full Family Communication: no family at bedside  Disposition Plan: to be determine; but will anticipate discharge home when medically stable. Will keep on stepdown due to requirement of amiodarone drip.   Consultants:  Neurology  Cardiology   Procedures:  2-D echo:  - Left ventricle: The cavity size was normal. Wall thickness was   normal. Systolic function was moderately to severely reduced. The   estimated ejection fraction was in the range of 30% to 35%.  Impressions: - The echo is technically very difficult due to the patients body   habitus.   The LV is not seen well. The LV function appears to be at least   moderately reduced.   Carotid duplex: pending   TEE: pending (planned for tomorrow 8/25)   See below for x-ray reports   Antibiotics:  None   HPI/Subjective: Feeling better. Still with mild sensation of palpitation. No slurred speech, no focal neurologic deficits.  Objective: Vitals:   07/15/16 1129 07/15/16 1628  BP: 94/61 (!) 156/132  Pulse: (!) 116 (!) 114  Resp: (!) 22 20  Temp: 97.9 F (36.6 C) 98.4 F (36.9 C)    Intake/Output Summary (Last 24 hours) at 07/15/16 1708 Last data filed at 07/15/16 0700  Gross per 24 hour  Intake            507.3 ml  Output                0 ml  Net            507.3 ml   Filed Weights   07/13/16 1315  Weight: 117.9 kg (260 lb)    Exam:   General:  Afebrile, no CP, no SOB. Patient w/o focal neurologic deficit and w/o dysarthria.  Cardiovascular: irregular, no rubs or gallops. Rate slightly better.  Respiratory: decrease Bs at bases, no wheezing, positive scattered rhonchi   Abdomen: soft, Nt, ND, positive BS  Musculoskeletal: no cyanosis or clubbing, complaining of pain on her knees from OA.  Data Reviewed: Basic Metabolic Panel:  Recent Labs Lab 07/13/16 1335 07/13/16 2206 07/14/16 0607  NA 140  --  139  K 4.3  --  3.9  CL 99*  --  96*  CO2 34*  --  37*  GLUCOSE 134*  --   100*  BUN 32*  --  19  CREATININE 0.94  --  0.88  CALCIUM 8.8*  --  8.6*  MG 1.8 1.9  --    Liver Function Tests:  Recent Labs Lab 07/13/16 1335 07/14/16 0607  AST 11* 11*  ALT 10* 9*  ALKPHOS 49 51  BILITOT 0.2* 0.3  PROT 7.7 7.0  ALBUMIN 2.9* 2.7*   CBC:  Recent Labs Lab 07/13/16 1335 07/14/16 0607  WBC 6.6 5.9  NEUTROABS 3.9  --   HGB 9.1* 8.8*  HCT 30.8* 30.6*  MCV 85.6 86.2  PLT 298 280   Cardiac Enzymes:  Recent Labs Lab 07/13/16 1335 07/13/16 2206 07/14/16 0324 07/14/16 0607  TROPONINI <0.03 <0.03 <0.03 <0.03   BNP (last 3 results)  Recent Labs  10/06/15 1800 10/21/15 1235 07/13/16 1335  BNP 532.1* 152.1* 328.4*   CBG: No results for input(s): GLUCAP in the last 168 hours.  Recent Results (from the past 240 hour(s))  MRSA PCR Screening     Status: Abnormal   Collection Time: 07/13/16  6:46 PM  Result Value Ref Range Status   MRSA by PCR POSITIVE (A) NEGATIVE Final    Comment:        The GeneXpert MRSA Assay (FDA approved for NASAL specimens only), is one component of a comprehensive MRSA colonization surveillance program. It is not intended to diagnose MRSA infection nor to guide or monitor treatment for MRSA infections. RESULT CALLED TO, READ BACK BY AND VERIFIED WITH: Haskell Riling RN [id number] 07/13/16 A BROWNING      Studies: Mr Brain Wo Contrast  Result Date: 07/14/2016 CLINICAL DATA:  65 y/o F; presenting with right facial droop and slurred speech beginning 4 days ago. EXAM: MRI HEAD WITHOUT CONTRAST MRA HEAD WITHOUT CONTRAST TECHNIQUE: Multiplanar, multiecho pulse sequences of the brain and surrounding structures were obtained without intravenous contrast. Angiographic images of the head were obtained using MRA technique without contrast. COMPARISON:  CT of head dated 07/13/2016.  MRI brain 10/07/2015. FINDINGS: MRI HEAD FINDINGS Brain: No diffusion restriction to suggest acute infarct. There are punctate foci of susceptibility  hypointensity within the cerebellum and bilateral frontal and parietal subcortical white matter without significant interval change compatible with prior microhemorrhage. Motion artifact partially degrades the FLAIR sequence. 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 The left parietal and temporal infarcts have mild foraminal 7 staining. No focal mass effect. Mild parenchymal atrophy. Extra-axial space: No hydrocephalus. No midline shift. No effacement of basilar cisterns. No extra-axial collection is identified. Proximal intracranial flow voids are maintained. No abnormality of the cervical medullary junction. Other: No abnormal signal of the paranasal sinuses. Partial opacification of right mastoid air cells. Orbits are unremarkable. Calvarium is unremarkable. 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 Anterior  cerebral arteries:  Patent. Middle cerebral arteries: Patent. Anterior communicating artery: Patent. Posterior communicating arteries:  Patent. Posterior cerebral arteries: Patent bilaterally. Small left P1 segment with large left posterior communicating artery consistent with fetal circulation, normal variant. Basilar artery:  Patent. Vertebral arteries:  Patent.  Left dominant. No evidence of significant stenosis or large vessel occlusion. IMPRESSION: 1. No evidence of acute/early subacute infarct or new intracranial hemorrhage. 2. 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 3. Stable parenchymal volume loss and chronic infarcts within left temporal, left parietal, and right cerebellum. 4. No significant stenosis or large vessel occlusion of the circle of Willis. These results will be called to the ordering clinician or representative by the  Radiologist Assistant, and communication documented in the PACS or zVision Dashboard. Electronically Signed   By: Kristine Garbe M.D.   On: 07/14/2016 01:58   Mr Jodene Nam Head/brain [id number] Cm  Result Date: 07/14/2016 CLINICAL DATA:  65 y/o F; presenting with right facial droop and slurred speech beginning 4 days ago. EXAM: MRI HEAD WITHOUT CONTRAST MRA HEAD WITHOUT CONTRAST TECHNIQUE: Multiplanar, multiecho pulse sequences of the brain and surrounding structures were obtained without intravenous contrast. Angiographic images of the head were obtained using MRA technique without contrast. COMPARISON:  CT of head dated 07/13/2016.  MRI brain 10/07/2015. FINDINGS: MRI HEAD FINDINGS Brain: No diffusion restriction to suggest acute infarct. There are punctate foci of susceptibility hypointensity within the cerebellum and bilateral frontal and parietal subcortical white matter without significant interval change compatible with prior microhemorrhage. Motion artifact partially degrades the FLAIR sequence. 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 The left parietal and temporal infarcts have mild foraminal 7 staining. No focal mass effect. Mild parenchymal atrophy. Extra-axial space: No hydrocephalus. No midline shift. No effacement of basilar cisterns. No extra-axial collection is identified. Proximal intracranial flow voids are maintained. No abnormality of the cervical medullary junction. Other: No abnormal signal of the paranasal sinuses. Partial opacification of right mastoid air cells. Orbits are unremarkable. Calvarium is unremarkable. 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 Anterior cerebral arteries:  Patent. Middle cerebral arteries: Patent. Anterior communicating artery: Patent. Posterior  communicating arteries:  Patent. Posterior cerebral arteries: Patent bilaterally. Small left P1 segment with large left posterior communicating artery consistent with fetal circulation, normal variant. Basilar artery:  Patent. Vertebral arteries:  Patent.  Left dominant. No evidence of significant stenosis or large vessel occlusion. IMPRESSION: 1. No evidence of acute/early subacute infarct or new intracranial hemorrhage. 2. 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 3. Stable parenchymal volume loss and chronic infarcts within left temporal, left parietal, and right cerebellum. 4. No significant stenosis or large vessel occlusion of the circle of Willis. These results will be called to the ordering clinician or representative by the Radiologist Assistant, and communication documented in the PACS or zVision Dashboard. Electronically Signed   By: Kristine Garbe M.D.   On: 07/14/2016 01:58    Scheduled Meds: .  stroke: mapping our early stages of recovery book   Does not apply Once  . aspirin  300 mg Rectal Daily   Or  . aspirin  325 mg Oral Daily  . atorvastatin  20 mg Oral q1800  . Chlorhexidine Gluconate Cloth  6 each Topical Q0600  .  divalproex  1,000 mg Oral BID  . gabapentin  600 mg Oral BID  . levothyroxine  125 mcg Oral QAC breakfast  . mupirocin ointment  1 application Nasal BID  . omega-3 acid ethyl esters  1 g Oral Daily  . risperiDONE  1 mg Oral QHS  . rivaroxaban  20 mg Oral Q supper   Continuous Infusions: . sodium chloride 10 mL/hr at 07/15/16 0700  . sodium chloride    . amiodarone 30 mg/hr (07/15/16 1510)    Principal Problem:   Atrial flutter with rapid ventricular response (HCC) Active Problems:   Essential hypertension   COPD (chronic obstructive pulmonary disease) (HCC)   Facial droop   Hypothyroidism   S/P CABG x 3   Chronic diastolic heart failure (HCC)   Slurred speech   Cerebral thrombosis with cerebral  infarction   TIA (transient ischemic attack)   Morbid obesity due to excess calories ([place])   Chronic systolic congestive heart failure ([place])    Time spent: 25 minutes    Barton Dubois  [facility] Pager [phone]. If 7PM-7AM, please contact night-coverage at [email or web address], password [facility] 07/15/2016, 5:08 PM  LOS: 2 days

## 2016-07-16 ENCOUNTER — Encounter (HOSPITAL_COMMUNITY): Payer: Self-pay | Admitting: Certified Registered Nurse Anesthetist

## 2016-07-16 ENCOUNTER — Encounter (HOSPITAL_COMMUNITY)
Admission: EM | Disposition: A | Discharge status: Home / Self Care | Payer: Self-pay | Source: Home / Self Care | Attending: Internal Medicine

## 2016-07-16 ENCOUNTER — Inpatient Hospital Stay (HOSPITAL_COMMUNITY): Payer: Medicare Other | Admitting: Certified Registered Nurse Anesthetist

## 2016-07-16 ENCOUNTER — Inpatient Hospital Stay (HOSPITAL_COMMUNITY): Payer: Medicare Other

## 2016-07-16 DIAGNOSIS — I484 Atypical atrial flutter: Secondary | ICD-10-CM

## 2016-07-16 DIAGNOSIS — I340 Nonrheumatic mitral (valve) insufficiency: Secondary | ICD-10-CM

## 2016-07-16 DIAGNOSIS — I4892 Unspecified atrial flutter: Secondary | ICD-10-CM

## 2016-07-16 HISTORY — PX: TEE WITHOUT CARDIOVERSION: SHX5443

## 2016-07-16 HISTORY — PX: CARDIOVERSION: SHX1299

## 2016-07-16 SURGERY — ECHOCARDIOGRAM, TRANSESOPHAGEAL
Anesthesia: Monitor Anesthesia Care

## 2016-07-16 MED ORDER — LIDOCAINE 2% (20 MG/ML) 5 ML SYRINGE
INTRAMUSCULAR | Status: DC | PRN
  Administered 2016-07-16: 80 mg via INTRAVENOUS

## 2016-07-16 MED ORDER — PROPOFOL 10 MG/ML IV BOLUS
INTRAVENOUS | Status: DC | PRN
  Administered 2016-07-16: 30 mg via INTRAVENOUS

## 2016-07-16 MED ORDER — PROPOFOL 500 MG/50ML IV EMUL
INTRAVENOUS | Status: DC | PRN
  Administered 2016-07-16: 25 ug/kg/min via INTRAVENOUS

## 2016-07-16 MED ORDER — BUTAMBEN-TETRACAINE-BENZOCAINE 2-2-14 % EX AERO
INHALATION_SPRAY | CUTANEOUS | Status: DC | PRN
  Administered 2016-07-16: 2 via TOPICAL

## 2016-07-16 NOTE — CV Procedure (Signed)
    Electrical Cardioversion Procedure Note Tonya Hogan [id number] 1951-05-12  Procedure: Electrical Cardioversion Indications:  Atrial Fibrillation  Time Out: Verified patient identification, verified procedure,medications/allergies/relevent history reviewed, required imaging and test results available.  Performed  Procedure Details  The patient was NPO after midnight. Anesthesia was administered at the beside with propofol.  Cardioversion was performed with synchronized biphasic defibrillation via AP pads with 120 joules.  1 attempt(s) were performed.  The patient converted to normal sinus rhythm. The patient tolerated the procedure well. Airway challenging, easy obstruction. Appreciate anesthesiology assistance.   IMPRESSION:  Successful cardioversion of atrial fibrillation.  On amiodarone IV. TEE - EF 35% No thrombus  Tonya Hogan 07/16/2016, 1:51 PM

## 2016-07-16 NOTE — Progress Notes (Signed)
PROGRESS NOTE  Subjective:   65 y.o. year old female with a history of HTN, CABG 01/31/2010 at [facility], bipolar, obesity, COPD, chronic anemia, D-CHF  with EF 55-60% and grade 2 dd, thyroid CA>>  Admitted with 4 day hx of slurred speech and was thought to have had a CVA Was found to have atrial flutter  Is on amio And xarelto  She remains in atrial flutter despite being on amiodarone for the past several days. She is scheduled for TEE/cardioversion today.  Objective:    Vital Signs:   Temp:  [97.9 F (36.6 C)-98.7 F (37.1 C)] 98.1 F (36.7 C) (08/25 0900) Pulse Rate:  [109-116] 112 (08/25 0900) Resp:  [19-29] 23 (08/25 0900) BP: (94-156)/(48-132) 101/48 (08/25 0900) SpO2:  [91 %-98 %] 91 % (08/25 0900)  Last BM Date: 07/13/16   24-hour weight change: Weight change:   Weight trends: Filed Weights   07/13/16 1315  Weight: 260 lb (117.9 kg)    Intake/Output:  08/24 0701 - 08/25 0700 In: 1434.1 [P.O.:820; I.V.:614.1] Out: -  No intake/output data recorded.   Physical Exam: BP (!) 101/48 (BP Location: Right Arm)   Pulse (!) 112   Temp 98.1 F (36.7 C) (Oral)   Resp (!) 23   Ht 5' (1.524 m)   Wt 260 lb (117.9 kg)   SpO2 91%   BMI 50.78 kg/m   Wt Readings from Last 3 Encounters:  07/13/16 260 lb (117.9 kg)  11/05/15 257 lb 6.4 oz (116.8 kg)  10/21/15 258 lb (117 kg)    General: Vital signs reviewed and noted.   Head: Normocephalic, atraumatic.  Eyes: conjunctivae/corneas clear.  EOM's intact.   Throat: normal  Neck:  normal   Lungs:    clear   Heart:  RR, tachy  Abdomen:  Soft, non-tender, non-distended  , obeste  Extremities: No c/c/e   Neurologic: A&O X3, CN II - XII are grossly intact.   Psych: Normal     Labs: BMET:  Recent Labs  07/13/16 1335 07/13/16 2206 07/14/16 0607  NA 140  --  139  K 4.3  --  3.9  CL 99*  --  96*  CO2 34*  --  37*  GLUCOSE 134*  --  100*  BUN 32*  --  19  CREATININE 0.94  --  0.88  CALCIUM 8.8*  --   8.6*  MG 1.8 1.9  --     Liver function tests:  Recent Labs  07/13/16 1335 07/14/16 0607  AST 11* 11*  ALT 10* 9*  ALKPHOS 49 51  BILITOT 0.2* 0.3  PROT 7.7 7.0  ALBUMIN 2.9* 2.7*   No results for input(s): LIPASE, AMYLASE in the last 72 hours.  CBC:  Recent Labs  07/13/16 1335 07/14/16 0607  WBC 6.6 5.9  NEUTROABS 3.9  --   HGB 9.1* 8.8*  HCT 30.8* 30.6*  MCV 85.6 86.2  PLT 298 280    Cardiac Enzymes:  Recent Labs  07/13/16 1335 07/13/16 2206 07/14/16 0324 07/14/16 0607  TROPONINI <0.03 <0.03 <0.03 <0.03    Coagulation Studies: No results for input(s): LABPROT, INR in the last 72 hours.  Other: Invalid input(s): POCBNP No results for input(s): DDIMER in the last 72 hours.  Recent Labs  07/14/16 0324  HGBA1C 6.0*    Recent Labs  07/14/16 0324  CHOL 190  HDL 49  LDLCALC 128*  TRIG 67  CHOLHDL 3.9    Recent Labs  07/13/16  2159  TSH 0.535   No results for input(s): VITAMINB12, FOLATE, FERRITIN, TIBC, IRON, RETICCTPCT in the last 72 hours.   Other results:  Tele   ( personally reviewed )  - atrial flutter with 2:1 conduction    Medications:    Infusions: . sodium chloride 10 mL/hr at 07/15/16 0700  . sodium chloride    . amiodarone 30 mg/hr (07/16/16 0332)    Scheduled Medications: .  stroke: mapping our early stages of recovery book   Does not apply Once  . aspirin  300 mg Rectal Daily   Or  . aspirin  325 mg Oral Daily  . atorvastatin  20 mg Oral q1800  . Chlorhexidine Gluconate Cloth  6 each Topical Q0600  . divalproex  1,000 mg Oral BID  . gabapentin  600 mg Oral BID  . levothyroxine  125 mcg Oral QAC breakfast  . mupirocin ointment  1 application Nasal BID  . omega-3 acid ethyl esters  1 g Oral Daily  . risperiDONE  1 mg Oral QHS  . rivaroxaban  20 mg Oral Q supper    Assessment/ Plan:   Principal Problem:   Atypical atrial flutter (HCC) Active Problems:   Essential hypertension   COPD (chronic  obstructive pulmonary disease) (HCC)   Facial droop   Hypothyroidism   S/P CABG x 3   Chronic diastolic heart failure (HCC)   Slurred speech   Cerebral thrombosis with cerebral infarction   TIA (transient ischemic attack)   Morbid obesity due to excess calories (HCC)   Chronic systolic congestive heart failure ([place])  1. Atrial flutter:   Still tachycardic She has been on xarelto for 3 days. Will do a TEE guided cardioversion today. Will keep npo after midnight  It's fairly difficult to discern this atrial flutter versus sinus tachycardia. I tried carotid sinus massage and Valsalva maneuver but neither slowed her heart rate. Twelve-lead EKG is consistent with slow atrial flutter with a rapid rate ventricular response.     Disposition:  Length of Stay: 3  Thayer Headings, Brooke Bonito., MD, [facility] 07/16/2016, 9:33 AM Office [phone] Pager [phone]

## 2016-07-16 NOTE — Anesthesia Preprocedure Evaluation (Addendum)
Anesthesia Evaluation  Patient identified by MRN, date of birth, ID band Patient awake    Reviewed: Allergy & Precautions, NPO status , Patient's Chart, lab work & pertinent test results  Airway Mallampati: III  TM Distance: >3 FB Neck ROM: Full    Dental no notable dental hx.    Pulmonary COPD,    Pulmonary exam normal        Cardiovascular hypertension, + CAD, + CABG and +CHF  Normal cardiovascular exam  Study Conclusions  - Left ventricle: The cavity size was normal. Wall thickness was   normal. Systolic function was moderately to severely reduced. The   estimated ejection fraction was in the range of 30% to 35%.   Neuro/Psych PSYCHIATRIC DISORDERS Depression Bipolar Disorder CVA    GI/Hepatic negative GI ROS, Neg liver ROS,   Endo/Other  Hypothyroidism Morbid obesity  Renal/GU      Musculoskeletal  (+) Arthritis ,   Abdominal   Peds  Hematology  (+) anemia ,   Anesthesia Other Findings   Reproductive/Obstetrics                            Anesthesia Physical Anesthesia Plan  ASA: IV  Anesthesia Plan: MAC   Post-op Pain Management:    Induction: Intravenous  Airway Management Planned: Simple Face Mask  Additional Equipment:   Intra-op Plan:   Post-operative Plan:   Informed Consent: I have reviewed the patients History and Physical, chart, labs and discussed the procedure including the risks, benefits and alternatives for the proposed anesthesia with the patient or authorized representative who has indicated his/her understanding and acceptance.   Dental advisory given  Plan Discussed with:   Anesthesia Plan Comments:         Anesthesia Quick Evaluation

## 2016-07-16 NOTE — Transfer of Care (Signed)
Immediate Anesthesia Transfer of Care Note  Patient: Tonya Hogan  Procedure(s) Performed: Procedure(s): TRANSESOPHAGEAL ECHOCARDIOGRAM (TEE) (N/A) CARDIOVERSION (N/A)  Patient Location: PACU and Endoscopy Unit  Anesthesia Type:MAC  Level of Consciousness: awake, alert , oriented and patient cooperative  Airway & Oxygen Therapy: Patient Spontanous Breathing and Patient connected to nasal cannula oxygen  Post-op Assessment: Report given to RN and Post -op Vital signs reviewed and stable  Post vital signs: Reviewed and stable  Last Vitals:  Vitals:   07/16/16 1237 07/16/16 1402  BP: 97/69 106/60  Pulse:  72  Resp: (!) 21 18  Temp: 36.7 C 36.7 C    Last Pain:  Vitals:   07/16/16 1402  TempSrc: Oral  PainSc:       Patients Stated Pain Goal: 0 ([id number])  Complications: No apparent anesthesia complications

## 2016-07-16 NOTE — Progress Notes (Signed)
[facility] PROGRESS NOTE  Zori Savoca [id number] DOB: 1950/12/16 DOA: 07/13/2016 PCP: No PCP Per Patient  Interim summary and HPI 65 y.o. female with CAD status post CABG, diastolic CHF, hypertension, bipolar disorder, COPD presents to the ER because of slurred speech. Patient has been having difficulty with speech since yesterday morning. Denies any weakness of the upper or lower extremity. Denies any visual symptoms or difficulty swallowing. While in the ER patient is found to be in atrial flutter with RVR. Patient was initially started on Cardizem infusion following which patient blood pressure started to drop. On my exam patient is still in atrial flutter with RVR. Patient still has slurring of the speech. CT of the head and CT angiogram of the chest was unremarkable.  Assessment/Plan: 1-TIA: with symptoms resolution now -MRI w/o stroke, MRA with R ICA based aneurysm, no significant stenosis  -2-D echo: w/o source of emboli appreciated; reduced EF. TEE pending  -carotid duplex with no significant stenosis  -A1C 6.0 -appears to be secondary to Atrial Flutter -secondary prevention with xarelto -LDL 128, will continue statins -continue risk factors modifications   2-Atrial flutter with RVR: -cardiology on board -will continue amiodarone drip -plans are for TEE and cardioversion later today  -xarelto for anticoagulation -rate slightly better, but still on A. Fib/A. flutter  3-HTN: -stable currently; will continue current medication regimen   4-HLD: -continue statins  5-morbid obesity: -Body mass index is 50.78 kg/m. -low calorie diet and exercise discussed with patient  6- hypothyroidism: -continue synthroid  7-chronic systolic heart failure: EF 30-35% -appears compensated; even difficult to assess due to body habitus -will follow cardiology rec's regarding medication management   8-COPD: -no wheezing currently -will continue as needed bronchodilators    Code Status: Full Family Communication: no family at bedside  Disposition Plan: to be determine; but will anticipate discharge home when medically stable. Will keep on stepdown due to requirement of amiodarone drip.   Consultants:  Neurology  Cardiology   Procedures:  2-D echo:  - Left ventricle: The cavity size was normal. Wall thickness was   normal. Systolic function was moderately to severely reduced. The   estimated ejection fraction was in the range of 30% to 35%.  Impressions: - The echo is technically very difficult due to the patients body   habitus.   The LV is not seen well. The LV function appears to be at least   moderately reduced.   Carotid duplex: no significant stenosis    TEE/cardioversion: later today (8/25)   See below for x-ray reports   Antibiotics:  None   HPI/Subjective: Feeling ok. In no distress and w/o acute events overnight. Denies CP and palpitations.  Objective: Vitals:   07/16/16 1600 07/16/16 1700  BP:    Pulse: 67 75  Resp: 15 (!) 23  Temp:      Intake/Output Summary (Last 24 hours) at 07/16/16 1803 Last data filed at 07/16/16 1348  Gross per 24 hour  Intake            947.3 ml  Output                0 ml  Net            947.3 ml   Filed Weights   07/13/16 1315  Weight: 117.9 kg (260 lb)    Exam:   General:  Afebrile, no CP, no SOB. Patient w/o focal neurologic deficit. Asking if she will be able to eat some chicken after  cardioversion completed.    Cardiovascular: irregular, no rubs or gallops. Rate slightly better.  Respiratory: decrease Bs at bases, no wheezing, positive scattered rhonchi   Abdomen: soft, Nt, ND, positive BS  Musculoskeletal: no cyanosis or clubbing, complaining of pain on her knees from OA.  Data Reviewed: Basic Metabolic Panel:  Recent Labs Lab 07/13/16 1335 07/13/16 2206 07/14/16 0607  NA 140  --  139  K 4.3  --  3.9  CL 99*  --  96*  CO2 34*  --  37*  GLUCOSE 134*  --   100*  BUN 32*  --  19  CREATININE 0.94  --  0.88  CALCIUM 8.8*  --  8.6*  MG 1.8 1.9  --    Liver Function Tests:  Recent Labs Lab 07/13/16 1335 07/14/16 0607  AST 11* 11*  ALT 10* 9*  ALKPHOS 49 51  BILITOT 0.2* 0.3  PROT 7.7 7.0  ALBUMIN 2.9* 2.7*   CBC:  Recent Labs Lab 07/13/16 1335 07/14/16 0607  WBC 6.6 5.9  NEUTROABS 3.9  --   HGB 9.1* 8.8*  HCT 30.8* 30.6*  MCV 85.6 86.2  PLT 298 280   Cardiac Enzymes:  Recent Labs Lab 07/13/16 1335 07/13/16 2206 07/14/16 0324 07/14/16 0607  TROPONINI <0.03 <0.03 <0.03 <0.03   BNP (last 3 results)  Recent Labs  10/06/15 1800 10/21/15 1235 07/13/16 1335  BNP 532.1* 152.1* 328.4*   CBG: No results for input(s): GLUCAP in the last 168 hours.  Recent Results (from the past 240 hour(s))  MRSA PCR Screening     Status: Abnormal   Collection Time: 07/13/16  6:46 PM  Result Value Ref Range Status   MRSA by PCR POSITIVE (A) NEGATIVE Final    Comment:        The GeneXpert MRSA Assay (FDA approved for NASAL specimens only), is one component of a comprehensive MRSA colonization surveillance program. It is not intended to diagnose MRSA infection nor to guide or monitor treatment for MRSA infections. RESULT CALLED TO, READ BACK BY AND VERIFIED WITH: Haskell Riling RN 2302 07/13/16 A BROWNING      Studies: No results found.  Scheduled Meds: .  stroke: mapping our early stages of recovery book   Does not apply Once  . aspirin  300 mg Rectal Daily   Or  . aspirin  325 mg Oral Daily  . atorvastatin  20 mg Oral q1800  . Chlorhexidine Gluconate Cloth  6 each Topical Q0600  . divalproex  1,000 mg Oral BID  . gabapentin  600 mg Oral BID  . levothyroxine  125 mcg Oral QAC breakfast  . mupirocin ointment  1 application Nasal BID  . omega-3 acid ethyl esters  1 g Oral Daily  . risperiDONE  1 mg Oral QHS  . rivaroxaban  20 mg Oral Q supper   Continuous Infusions: . sodium chloride 10 mL/hr at 07/15/16 0700  .  amiodarone 30 mg/hr (07/16/16 1700)    Principal Problem:   Atypical atrial flutter (HCC) Active Problems:   Essential hypertension   COPD (chronic obstructive pulmonary disease) (HCC)   Facial droop   Hypothyroidism   S/P CABG x 3   Chronic diastolic heart failure (HCC)   Slurred speech   Cerebral thrombosis with cerebral infarction   TIA (transient ischemic attack)   Morbid obesity due to excess calories ([place])   Chronic systolic congestive heart failure ([place])    Time spent: 25 minutes    Barton Dubois  [facility] Pager [phone]. If 7PM-7AM, please contact night-coverage at [email or web address], password [facility] 07/16/2016, 6:03 PM  LOS: 3 days

## 2016-07-16 NOTE — Interval H&P Note (Signed)
History and Physical Interval Note:  07/16/2016 1:03 PM  Tonya Hogan  has presented today for surgery, with the diagnosis of AFIB  The various methods of treatment have been discussed with the patient and family. After consideration of risks, benefits and other options for treatment, the patient has consented to  Procedure(s): TRANSESOPHAGEAL ECHOCARDIOGRAM (TEE) (N/A) CARDIOVERSION (N/A) as a surgical intervention .  The patient's history has been reviewed, patient examined, no change in status, stable for surgery.  I have reviewed the patient's chart and labs.  Questions were answered to the patient's satisfaction.     [employer]

## 2016-07-16 NOTE — H&P (View-Only) (Signed)
PROGRESS NOTE  Subjective:   65 y.o. year old female with a history of HTN, CABG 01/31/2010 at [facility], bipolar, obesity, COPD, chronic anemia, D-CHF  with EF 55-60% and grade 2 dd, thyroid CA>>  Admitted with 4 day hx of slurred speech and was thought to have had a CVA Was found to have atrial flutter  Is on amio And xarelto  She remains in atrial flutter despite being on amiodarone for the past several days. She is scheduled for TEE/cardioversion today.  Objective:    Vital Signs:   Temp:  [97.9 F (36.6 C)-98.7 F (37.1 C)] 98.1 F (36.7 C) (08/25 0900) Pulse Rate:  [109-116] 112 (08/25 0900) Resp:  [19-29] 23 (08/25 0900) BP: (94-156)/(48-132) 101/48 (08/25 0900) SpO2:  [91 %-98 %] 91 % (08/25 0900)  Last BM Date: 07/13/16   24-hour weight change: Weight change:   Weight trends: Filed Weights   07/13/16 1315  Weight: 260 lb (117.9 kg)    Intake/Output:  08/24 0701 - 08/25 0700 In: 1434.1 [P.O.:820; I.V.:614.1] Out: -  No intake/output data recorded.   Physical Exam: BP (!) 101/48 (BP Location: Right Arm)   Pulse (!) 112   Temp 98.1 F (36.7 C) (Oral)   Resp (!) 23   Ht 5' (1.524 m)   Wt 260 lb (117.9 kg)   SpO2 91%   BMI 50.78 kg/m   Wt Readings from Last 3 Encounters:  07/13/16 260 lb (117.9 kg)  11/05/15 257 lb 6.4 oz (116.8 kg)  10/21/15 258 lb (117 kg)    General: Vital signs reviewed and noted.   Head: Normocephalic, atraumatic.  Eyes: conjunctivae/corneas clear.  EOM's intact.   Throat: normal  Neck:  normal   Lungs:    clear   Heart:  RR, tachy  Abdomen:  Soft, non-tender, non-distended  , obeste  Extremities: No c/c/e   Neurologic: A&O X3, CN II - XII are grossly intact.   Psych: Normal     Labs: BMET:  Recent Labs  07/13/16 1335 07/13/16 2206 07/14/16 0607  NA 140  --  139  K 4.3  --  3.9  CL 99*  --  96*  CO2 34*  --  37*  GLUCOSE 134*  --  100*  BUN 32*  --  19  CREATININE 0.94  --  0.88  CALCIUM 8.8*  --   8.6*  MG 1.8 1.9  --     Liver function tests:  Recent Labs  07/13/16 1335 07/14/16 0607  AST 11* 11*  ALT 10* 9*  ALKPHOS 49 51  BILITOT 0.2* 0.3  PROT 7.7 7.0  ALBUMIN 2.9* 2.7*   No results for input(s): LIPASE, AMYLASE in the last 72 hours.  CBC:  Recent Labs  07/13/16 1335 07/14/16 0607  WBC 6.6 5.9  NEUTROABS 3.9  --   HGB 9.1* 8.8*  HCT 30.8* 30.6*  MCV 85.6 86.2  PLT 298 280    Cardiac Enzymes:  Recent Labs  07/13/16 1335 07/13/16 2206 07/14/16 0324 07/14/16 0607  TROPONINI <0.03 <0.03 <0.03 <0.03    Coagulation Studies: No results for input(s): LABPROT, INR in the last 72 hours.  Other: Invalid input(s): POCBNP No results for input(s): DDIMER in the last 72 hours.  Recent Labs  07/14/16 0324  HGBA1C 6.0*    Recent Labs  07/14/16 0324  CHOL 190  HDL 49  LDLCALC 128*  TRIG 67  CHOLHDL 3.9    Recent Labs  07/13/16  2159  TSH 0.535   No results for input(s): VITAMINB12, FOLATE, FERRITIN, TIBC, IRON, RETICCTPCT in the last 72 hours.   Other results:  Tele   ( personally reviewed )  - atrial flutter with 2:1 conduction    Medications:    Infusions: . sodium chloride 10 mL/hr at 07/15/16 0700  . sodium chloride    . amiodarone 30 mg/hr (07/16/16 0332)    Scheduled Medications: .  stroke: mapping our early stages of recovery book   Does not apply Once  . aspirin  300 mg Rectal Daily   Or  . aspirin  325 mg Oral Daily  . atorvastatin  20 mg Oral q1800  . Chlorhexidine Gluconate Cloth  6 each Topical Q0600  . divalproex  1,000 mg Oral BID  . gabapentin  600 mg Oral BID  . levothyroxine  125 mcg Oral QAC breakfast  . mupirocin ointment  1 application Nasal BID  . omega-3 acid ethyl esters  1 g Oral Daily  . risperiDONE  1 mg Oral QHS  . rivaroxaban  20 mg Oral Q supper    Assessment/ Plan:   Principal Problem:   Atypical atrial flutter (HCC) Active Problems:   Essential hypertension   COPD (chronic  obstructive pulmonary disease) (HCC)   Facial droop   Hypothyroidism   S/P CABG x 3   Chronic diastolic heart failure (HCC)   Slurred speech   Cerebral thrombosis with cerebral infarction   TIA (transient ischemic attack)   Morbid obesity due to excess calories (HCC)   Chronic systolic congestive heart failure ([place])  1. Atrial flutter:   Still tachycardic She has been on xarelto for 3 days. Will do a TEE guided cardioversion today. Will keep npo after midnight  It's fairly difficult to discern this atrial flutter versus sinus tachycardia. I tried carotid sinus massage and Valsalva maneuver but neither slowed her heart rate. Twelve-lead EKG is consistent with slow atrial flutter with a rapid rate ventricular response.     Disposition:  Length of Stay: 3  Thayer Headings, Brooke Bonito., MD, [facility] 07/16/2016, 9:33 AM Office [phone] Pager [phone]

## 2016-07-16 NOTE — Progress Notes (Signed)
Physical Therapy Treatment Patient Details Name: Tonya Hogan MRN: [id number] DOB: [birth year] Today's Date: 07/16/2016    History of Present Illness 65 y.o. female with CAD status post CABG, diastolic CHF, hypertension, bipolar disorder, COPD presents to the ER because of slurred speech.[facility] Kitchen MRI -. atrial flutter with RVR s/p cardioversion on 07/16/16.     PT Comments    Pt is mobilizing much better today and I think will progress even more once her cardioversion is complete as she had to stop and rest 3 times during gait today due to DOE with HR in the 120s.  Pt will continue to follow acutely.    Follow Up Recommendations  No PT follow up;Supervision - Intermittent     Equipment Recommendations  None recommended by PT    Recommendations for Other Services   NA     Precautions / Restrictions Precautions Precautions: Fall    Mobility  Bed Mobility               General bed mobility comments: Pt OOB in chair.   Transfers Overall transfer level: Needs assistance Equipment used: Rolling walker (2 wheeled) Transfers: Sit to/from Stand Sit to Stand: Supervision         General transfer comment: Supervision for safety and line management.   Ambulation/Gait Ambulation/Gait assistance: Min guard Ambulation Distance (Feet): 300 Feet Assistive device: Rolling walker (2 wheeled) Gait Pattern/deviations: Step-through pattern;Shuffle Gait velocity: decreased Gait velocity interpretation: Below normal speed for age/gender General Gait Details: Pt took two standing rest breaks due to DOE with giat (pt trying to walk and sing at the same time).  HR increased to 123, RN made aware and pt going for cardioversion today.  O2 sats remained stable on 2 L O2 [place].  Encouraged pursed lip breathing during standing rest breaks.           Balance Overall balance assessment: Needs assistance Sitting-balance support: Feet supported;No upper extremity supported Sitting balance-Leahy  Scale: Good     Standing balance support: Bilateral upper extremity supported;No upper extremity supported;Single extremity supported Standing balance-Leahy Scale: Good                      Cognition Arousal/Alertness: Awake/alert Behavior During Therapy: WFL for tasks assessed/performed Overall Cognitive Status: Within Functional Limits for tasks assessed                 General Comments: Pt seemed improved today cognitively.  Has h/o bipolar d/o re: RN and chart.  She was not as impulsive today, but very chatty.             Pertinent Vitals/Pain Pain Assessment: No/denies pain           PT Goals (current goals can now be found in the care plan section) Acute Rehab PT Goals Patient Stated Goal: to get back to her independence, get her kids to stop treating her like a child.  Progress towards PT goals: Progressing toward goals    Frequency  Min 3X/week    PT Plan Current plan remains appropriate       End of Session Equipment Utilized During Treatment: Oxygen Activity Tolerance: Other (comment) (limited by DOE during gait) Patient left: in chair;with call bell/phone within reach     Time: 1130-1150 PT Time Calculation (min) (ACUTE ONLY): 20 min  Charges:  $Gait Training: 8-22 mins  Barbarann Ehlers Blanco, [place], DPT [phone]   07/16/2016, 2:10 PM

## 2016-07-16 NOTE — Progress Notes (Signed)
  Echocardiogram Echocardiogram Transesophageal has been performed.  Aggie Cosier 07/16/2016, 1:55 PM

## 2016-07-17 LAB — BASIC METABOLIC PANEL
ANION GAP: 6 (ref 5–15)
BUN: 11 mg/dL (ref 4–21)
BUN: 11 mg/dL (ref 6–20)
CHLORIDE: 100 mmol/L — AB (ref 101–111)
CO2: 33 mmol/L — AB (ref 22–32)
Calcium: 8.6 mg/dL — ABNORMAL LOW (ref 8.9–10.3)
Creatinine, Ser: 0.92 mg/dL (ref 0.44–1.00)
Creatinine: 0.9 mg/dL (ref 0.5–1.1)
GFR calc non Af Amer: >60 mL/min (ref >60)
GLUCOSE: 152 mg/dL — AB (ref 65–99)
Glucose: 152 mg/dL
POTASSIUM: 4.1 mmol/L (ref 3.5–5.1)
Potassium: 4.1 mmol/L (ref 3.4–5.3)
SODIUM: 139 mmol/L (ref 137–147)
Sodium: 139 mmol/L (ref 135–145)

## 2016-07-17 LAB — CBC
HEMATOCRIT: 30.5 % — AB (ref 36.0–46.0)
HEMOGLOBIN: 8.8 g/dL — AB (ref 12.0–15.0)
MCH: 24.6 pg — ABNORMAL LOW (ref 26.0–34.0)
MCHC: 28.9 g/dL — ABNORMAL LOW (ref 30.0–36.0)
MCV: 85.4 fL (ref 78.0–100.0)
Platelets: 236 10*3/uL (ref 150–400)
RBC: 3.57 MIL/uL — AB (ref 3.87–5.11)
RDW: 17.2 % — AB (ref 11.5–15.5)
WBC: 6.6 10*3/uL (ref 4.0–10.5)

## 2016-07-17 LAB — CBC AND DIFFERENTIAL
HEMATOCRIT: 31 % — AB (ref 36–46)
HEMOGLOBIN: 8.8 g/dL — AB (ref 12.0–16.0)
PLATELETS: 236 10*3/uL (ref 150–399)
WBC: 6.6 10^3/mL

## 2016-07-17 MED ORDER — LISINOPRIL 10 MG PO TABS
10.0000 mg | ORAL_TABLET | Freq: Every day | ORAL | Status: DC
  Administered 2016-07-17 – 2016-07-18 (×2): 10 mg via ORAL
  Filled 2016-07-17 (×2): qty 1

## 2016-07-17 MED ORDER — ASPIRIN EC 81 MG PO TBEC
81.0000 mg | DELAYED_RELEASE_TABLET | Freq: Every day | ORAL | Status: DC
  Administered 2016-07-18: 81 mg via ORAL
  Filled 2016-07-17: qty 1

## 2016-07-17 MED ORDER — METOPROLOL SUCCINATE ER 25 MG PO TB24
25.0000 mg | ORAL_TABLET | Freq: Every day | ORAL | Status: DC
  Administered 2016-07-17 – 2016-07-18 (×2): 25 mg via ORAL
  Filled 2016-07-17 (×2): qty 1

## 2016-07-17 MED ORDER — AMIODARONE HCL 200 MG PO TABS
200.0000 mg | ORAL_TABLET | Freq: Two times a day (BID) | ORAL | Status: DC
  Administered 2016-07-17 – 2016-07-18 (×3): 200 mg via ORAL
  Filled 2016-07-17 (×3): qty 1

## 2016-07-17 NOTE — Progress Notes (Signed)
Attempted report. Nurse unavailable at the moment. Will try back.

## 2016-07-17 NOTE — Progress Notes (Signed)
[facility] PROGRESS NOTE  Kollyns Bucciarelli [id number] DOB: [birth year] DOA: 07/13/2016 PCP: No PCP Per Patient  Interim summary and HPI 65 y.o. female with CAD status post CABG, diastolic CHF, hypertension, bipolar disorder, COPD presents to the ER because of slurred speech. Patient has been having difficulty with speech since yesterday morning. Denies any weakness of the upper or lower extremity. Denies any visual symptoms or difficulty swallowing. While in the ER patient is found to be in atrial flutter with RVR. Patient was initially started on Cardizem infusion following which patient blood pressure started to drop. On my exam patient is still in atrial flutter with RVR. Patient still has slurring of the speech. CT of the head and CT angiogram of the chest was unremarkable.  Assessment/Plan: 1-TIA: with symptoms resolution now -MRI w/o stroke, MRA with R ICA based aneurysm, no significant stenosis  -2-D echo: w/o source of emboli appreciated; reduced EF. TEE w/o thrombi; S/P cardioversion and has successfully remained in SR.  -carotid duplex with no significant stenosis  -A1C 6.0 -appears to be secondary to Atrial Flutter -secondary prevention with xarelto -LDL 128, will continue statins -continue risk factors modifications   2-Atrial flutter with RVR: -cardiology on board -S/P successful TEE and cardioversion 8/25 -continue xarelto for anticoagulation -on amiodarone and metoprolol for rate control   3-HTN: -stable currently; will continue current medication regimen   4-HLD: -continue statins  5-morbid obesity: -Body mass index is 50.78 kg/m. -low calorie diet and exercise discussed with patient  6- hypothyroidism: -continue synthroid  7-chronic systolic heart failure: EF 30-35% -appears compensated; even difficult to assess due to body habitus -will start low dose metoprolol and lisinopril  -encourage to follow low sodium diet and check weight on daily basis    8-COPD: -no wheezing currently; good air movement bilaterally  -will continue as needed bronchodilators   Code Status: Full Family Communication: no family at bedside  Disposition Plan: to be determine; but will anticipate discharge home when medically stable. Will transfer to telemetry, transition amiodarone to PO and if stable, home in am.  Consultants:  Neurology  Cardiology   Procedures:  2-D echo:  - Left ventricle: The cavity size was normal. Wall thickness was   normal. Systolic function was moderately to severely reduced. The   estimated ejection fraction was in the range of 30% to 35%.  Impressions: - The echo is technically very difficult due to the patients body   habitus.   The LV is not seen well. The LV function appears to be at least   moderately reduced.   Carotid duplex: no significant stenosis    TEE/cardioversion: w/o appreciated emboli. Successful cardioversion 8/25   See below for x-ray reports   Antibiotics:  None   HPI/Subjective: Denies CP and palpitations. No focal neurologic deficit. Patient denies CP and SOB.  Objective: Vitals:   07/17/16 0447 07/17/16 0700  BP: 116/61 113/70  Pulse: 76 69  Resp: (!) 26 19  Temp: 98.1 F (36.7 C) 97.4 F (36.3 C)    Intake/Output Summary (Last 24 hours) at 07/17/16 1013 Last data filed at 07/17/16 0900  Gross per 24 hour  Intake           1067.1 ml  Output                0 ml  Net           1067.1 ml   Filed Weights   07/13/16 1315  Weight: 117.9 kg (260 lb)  Exam:   General:  Afebrile, no CP, no SOB. Patient w/o focal neurologic deficit. Denies palpitations. Telemetry evaluation demonstrate sinus rhythm.    Cardiovascular: irregular, no rubs or gallops. Rate slightly better.  Respiratory: decrease Bs at bases, no wheezing, positive scattered rhonchi   Abdomen: soft, Nt, ND, positive BS  Musculoskeletal: no cyanosis or clubbing, complaining of pain on her knees from  OA.  Data Reviewed: Basic Metabolic Panel:  Recent Labs Lab 07/13/16 1335 07/13/16 2206 07/14/16 0607  NA 140  --  139  K 4.3  --  3.9  CL 99*  --  96*  CO2 34*  --  37*  GLUCOSE 134*  --  100*  BUN 32*  --  19  CREATININE 0.94  --  0.88  CALCIUM 8.8*  --  8.6*  MG 1.8 1.9  --    Liver Function Tests:  Recent Labs Lab 07/13/16 1335 07/14/16 0607  AST 11* 11*  ALT 10* 9*  ALKPHOS 49 51  BILITOT 0.2* 0.3  PROT 7.7 7.0  ALBUMIN 2.9* 2.7*   CBC:  Recent Labs Lab 07/13/16 1335 07/14/16 0607  WBC 6.6 5.9  NEUTROABS 3.9  --   HGB 9.1* 8.8*  HCT 30.8* 30.6*  MCV 85.6 86.2  PLT 298 280   Cardiac Enzymes:  Recent Labs Lab 07/13/16 1335 07/13/16 2206 07/14/16 0324 07/14/16 0607  TROPONINI <0.03 <0.03 <0.03 <0.03   BNP (last 3 results)  Recent Labs  10/06/15 1800 10/21/15 1235 07/13/16 1335  BNP 532.1* 152.1* 328.4*   CBG: No results for input(s): GLUCAP in the last 168 hours.  Recent Results (from the past 240 hour(s))  MRSA PCR Screening     Status: Abnormal   Collection Time: 07/13/16  6:46 PM  Result Value Ref Range Status   MRSA by PCR POSITIVE (A) NEGATIVE Final    Comment:        The GeneXpert MRSA Assay (FDA approved for NASAL specimens only), is one component of a comprehensive MRSA colonization surveillance program. It is not intended to diagnose MRSA infection nor to guide or monitor treatment for MRSA infections. RESULT CALLED TO, READ BACK BY AND VERIFIED WITH: Haskell Riling RN 2302 07/13/16 A BROWNING      Studies: No results found.  Scheduled Meds: .  stroke: mapping our early stages of recovery book   Does not apply Once  . amiodarone  200 mg Oral BID  . aspirin EC  81 mg Oral Daily  . atorvastatin  20 mg Oral q1800  . Chlorhexidine Gluconate Cloth  6 each Topical Q0600  . divalproex  1,000 mg Oral BID  . gabapentin  600 mg Oral BID  . levothyroxine  125 mcg Oral QAC breakfast  . lisinopril  10 mg Oral Daily  .  metoprolol succinate  25 mg Oral Daily  . mupirocin ointment  1 application Nasal BID  . omega-3 acid ethyl esters  1 g Oral Daily  . risperiDONE  1 mg Oral QHS  . rivaroxaban  20 mg Oral Q supper   Continuous Infusions: . sodium chloride 10 mL/hr at 07/17/16 0600    Principal Problem:   Atypical atrial flutter (HCC) Active Problems:   Essential hypertension   COPD (chronic obstructive pulmonary disease) (HCC)   Facial droop   Hypothyroidism   S/P CABG x 3   Chronic diastolic heart failure (HCC)   Slurred speech   Cerebral thrombosis with cerebral infarction   TIA (transient ischemic attack)  Morbid obesity due to excess calories ([place])   Chronic systolic congestive heart failure ([place])    Time spent: 25 minutes    Barton Dubois  [facility] Pager [phone]. If 7PM-7AM, please contact night-coverage at [email or web address], password [facility] 07/17/2016, 10:13 AM  LOS: 4 days

## 2016-07-17 NOTE — Progress Notes (Signed)
Attempted report. Nurse currently unavailable. Left number for nurse to call back at.

## 2016-07-17 NOTE — Progress Notes (Signed)
Subjective:  Tolerated cardioversion well and maintain sinus rhythm overnight.  Denies angina or shortness of breath.  Objective:  Vital Signs in the last 24 hours: BP 113/70 (BP Location: Left Wrist)   Pulse 69   Temp 97.4 F (36.3 C) (Oral)   Resp 19   Ht 5' (1.524 m)   Wt 117.9 kg (260 lb)   SpO2 93%   BMI 50.78 kg/m   Physical Exam: Severely obese black female sitting up in bed side in chair. Lungs:  Clear Cardiac:  Regular rhythm, normal S1 and S2, no S3 Abdomen:  Soft, nontender, no masses Extremities:  No edema present  Intake/Output from previous day: 08/25 0701 - 08/26 0700 In: 877.2 [P.O.:240; I.V.:637.2] Out: -   Weight Filed Weights   07/13/16 1315  Weight: 117.9 kg (260 lb)    Telemetry: Sinus rhythm since cardioversion   Assessment/Plan:  1.  Atypical atrial flutter currently maintaining sinus rhythm 2.  Systolic heart failure chronic may be due to tachycardia 3.  Severe morbid obesity 4.  Hypertensive heart disease  Recommendations:  I would institute medicines for systolic dysfunction including metoprolol and ACE inhibitor.  She would also benefit from hydralazine as an outpatient.  She will need to have primary care follow-up arranged as of this has been sporadic.  Continue Xarelto.  Change to oral amiodarone and may give a short course of it.  Moved to floor.     Kerry Hough  MD [facility]  07/17/2016, 9:42 AM

## 2016-07-18 DIAGNOSIS — J9611 Chronic respiratory failure with hypoxia: Secondary | ICD-10-CM

## 2016-07-18 LAB — BASIC METABOLIC PANEL
Anion gap: 6 (ref 5–15)
BUN: 20 mg/dL (ref 4–21)
BUN: 20 mg/dL (ref 6–20)
CHLORIDE: 100 mmol/L — AB (ref 101–111)
CO2: 34 mmol/L — AB (ref 22–32)
CREATININE: 1.03 mg/dL — AB (ref 0.44–1.00)
Calcium: 8.8 mg/dL — ABNORMAL LOW (ref 8.9–10.3)
Creatinine: 1 mg/dL (ref 0.5–1.1)
GFR calc non Af Amer: 56 mL/min — ABNORMAL LOW (ref >60)
GLUCOSE: 81 mg/dL
Glucose, Bld: 81 mg/dL (ref 65–99)
POTASSIUM: 4.4 mmol/L (ref 3.5–5.1)
Potassium: 4.4 mmol/L (ref 3.4–5.3)
Sodium: 140 mmol/L (ref 135–145)
Sodium: 140 mmol/L (ref 137–147)

## 2016-07-18 LAB — CBC
HEMATOCRIT: 29.7 % — AB (ref 36.0–46.0)
HEMOGLOBIN: 8.4 g/dL — AB (ref 12.0–15.0)
MCH: 24.3 pg — ABNORMAL LOW (ref 26.0–34.0)
MCHC: 28.3 g/dL — AB (ref 30.0–36.0)
MCV: 86.1 fL (ref 78.0–100.0)
Platelets: 253 10*3/uL (ref 150–400)
RBC: 3.45 MIL/uL — AB (ref 3.87–5.11)
RDW: 17.2 % — ABNORMAL HIGH (ref 11.5–15.5)
WBC: 7.4 10*3/uL (ref 4.0–10.5)

## 2016-07-18 LAB — CBC AND DIFFERENTIAL
HEMATOCRIT: 30 % — AB (ref 36–46)
HEMOGLOBIN: 8.4 g/dL — AB (ref 12.0–16.0)
PLATELETS: 253 10*3/uL (ref 150–399)
WBC: 7.4 10*3/mL

## 2016-07-18 MED ORDER — AMIODARONE HCL 200 MG PO TABS: 200.0000 mg | ORAL_TABLET | Freq: Two times a day (BID) | ORAL | 1 refills | Status: AC

## 2016-07-18 MED ORDER — ASPIRIN 81 MG PO TBEC: 81.0000 mg | DELAYED_RELEASE_TABLET | Freq: Every day | ORAL | 1 refills | Status: AC

## 2016-07-18 MED ORDER — ATORVASTATIN CALCIUM 20 MG PO TABS: 20.0000 mg | ORAL_TABLET | Freq: Every day | ORAL | 1 refills | Status: AC

## 2016-07-18 MED ORDER — LISINOPRIL 10 MG PO TABS: 10.0000 mg | ORAL_TABLET | Freq: Every day | ORAL | 1 refills | Status: DC

## 2016-07-18 MED ORDER — METOPROLOL SUCCINATE ER 25 MG PO TB24: 25.0000 mg | ORAL_TABLET | Freq: Every day | ORAL | 1 refills | Status: DC

## 2016-07-18 MED ORDER — RIVAROXABAN 20 MG PO TABS
20.0000 mg | ORAL_TABLET | Freq: Every day | ORAL | 1 refills | Status: AC
Start: 2016-07-18 — End: ?

## 2016-07-18 MED ORDER — FUROSEMIDE 40 MG PO TABS: 20.0000 mg | ORAL_TABLET | Freq: Every day | ORAL | 1 refills | Status: DC

## 2016-07-18 NOTE — Progress Notes (Signed)
Discharge papers discussed with patient and patient understood. IV removed, family arrived for transportation to home [place], Therapist, sports

## 2016-07-18 NOTE — Discharge Summary (Signed)
Physician Discharge Summary  Tonya Hogan [id number] DOB: August 14, 1951 DOA: 07/13/2016  PCP: No PCP Per Patient  Admit date: 07/13/2016 Discharge date: 07/18/2016  Time spent: 35 minutes  Recommendations for Outpatient Follow-up:  1. Repeat BMET to follow up renal function and electrolytes 2. Close follow up of CBG's and A1C 3. Reassess BP and adjust medications as needed  4. Follow up with cardiology to follow chronic systolic HF and reassess A. Fib/Flutter    Discharge Diagnoses:  Principal Problem:   Atypical atrial flutter ([place]) Active Problems:   Essential hypertension   COPD (chronic obstructive pulmonary disease) (HCC)   Facial droop   Hypothyroidism   S/P CABG x 3   Chronic diastolic heart failure (H[facility])   Slurred speech   Cerebral thrombosis with cerebral infarction   TIA (transient ischemic attack)   Morbid obesity due to excess calories (HCC)   Chronic systolic congestive heart failure (HCC)   Chronic respiratory failure with hypoxia ([place])   Discharge Condition: stable and improved. Patient discharge home with instructions to follow up with cardiology and also with instructions to establish care with PCP. Follow up in 10 days or so at [facility] (CM consulted to assist arranging follow up visit)  Diet recommendation: heart healthy, low carbohydrates, low calorie and low sodium diet   Filed Weights   07/13/16 1315 07/17/16 1334 07/18/16 0400  Weight: 117.9 kg (260 lb) 124.4 kg (274 lb 4.8 oz) 125.2 kg (276 lb)    History of present illness:  65 y.o.femalewith CAD status post CABG, diastolic CHF, hypertension, bipolar disorder, COPD presents to the ER because of slurred speech. Patient has been having difficulty with speech since yesterday morning. Denies any weakness of the upper or lower extremity. Denies any visual symptoms or difficulty swallowing. While in the ER patient is found to be in atrial flutter with RVR. Patient was initially started on  Cardizem infusion following which patient blood pressure started to drop. On my exam patient is still in atrial flutter with RVR. Patient still has slurring of the speech. CT of the head and CT angiogram of the chest was unremarkable.  Hospital Course:  1-TIA: with symptoms resolution now -MRI w/o stroke, MRA with R ICA based aneurysm, no significant stenosis  -2-D echo: w/o source of emboli appreciated; reduced EF. TEE w/o thrombi; S/P cardioversion and has successfully remained in SR.  -carotid duplex with no significant stenosis  -A1C 6.0 -appears to be secondary to Atrial Flutter -secondary prevention with xarelto -LDL 128, will continue statins -continue risk factors modifications   2-Atrial flutter with RVR: -cardiology will follow patient at discharge -S/P successful TEE and cardioversion 8/25 -continue xarelto for anticoagulation -on amiodarone and metoprolol for rate control   3-HTN: -stable currently; will continue current medication regimen  -advise to follow low sodium diet   4-HLD: -continue statins  5-morbid obesity: -Body mass index is 50.78 kg/m. -low calorie diet and exercise discussed with patient -will benefit of outpatient follow up with bariatric clinic   6- hypothyroidism: -continue synthroid  7-chronic systolic heart failure: EF 30-35% -appears compensated; even difficult to assess due to body habitus -will discharge on on low dose metoprolol and lisinopril  -encourage to follow low sodium diet and check weight on daily basis  -continue daily lasix   8-chronic resp failure due to COPD: also with OHS -no wheezing currently; good air movement bilaterally  -will continue as needed bronchodilators  -continue chronic O2 supplementation   9-glucose intolerance: -A1C 6.0 -advise to  follow low carb diet -will need close follow up on CBG's and A1C; initiation on hypoglycemic regimen as needed   Procedures:  2-D echo:  - Left ventricle: The  cavity size was normal. Wall thickness was normal. Systolic function was moderately to severely reduced. The estimated ejection fraction was in the range of 30% to 35%.  Impressions: - The echo is technically very difficult due to the patients body habitus. The LV is not seen well. The LV function appears to be at least moderately reduced.   Carotid duplex: no significant stenosis    TEE/cardioversion: w/o appreciated emboli. Successful cardioversion 8/25   See below for x-ray reports   Consultations:  Cardiology   Discharge Exam: Vitals:   07/18/16 1118 07/18/16 1147  BP:  (!) 107/53  Pulse: 70 61  Resp:  18  Temp:  98.7 F (37.1 C)     General:  Afebrile, no CP, no SOB. Patient w/o focal neurologic deficit. Denies palpitations. Telemetry evaluation demonstrate sinus rhythm.    Cardiovascular: irregular, no rubs or gallops. Rate slightly better.  Respiratory: decrease Bs at bases, no wheezing, positive scattered rhonchi   Abdomen: soft, Nt, ND, positive BS  Musculoskeletal: no cyanosis or clubbing, complaining of pain on her knees from OA.   Discharge Instructions   Discharge Instructions    (HEART FAILURE PATIENTS) Call MD:  Anytime you have any of the following symptoms: 1) 3 pound weight gain in 24 hours or 5 pounds in 1 week 2) shortness of breath, with or without a dry hacking cough 3) swelling in the hands, feet or stomach 4) if you have to sleep on extra pillows at night in order to breathe.    Complete by:  As directed   Call MD for:  difficulty breathing, headache or visual disturbances    Complete by:  As directed   Call MD for:  persistant dizziness or light-headedness    Complete by:  As directed   Diet - low sodium heart healthy    Complete by:  As directed   Discharge instructions    Complete by:  As directed   Follow heart healthy diet (less than 2 gram daily) Please follow up with cardiology as instructed Check weight on daily  basis  Maintain adequate hydration  Take medications as prescribed  Please increase activity and follow low calorie diet, in order to lose weight     Current Discharge Medication List    START taking these medications   Details  amiodarone (PACERONE) 200 MG tablet Take 1 tablet (200 mg total) by mouth 2 (two) times daily. Qty: 60 tablet, Refills: 1    aspirin EC 81 MG EC tablet Take 1 tablet (81 mg total) by mouth daily. Qty: 30 tablet, Refills: 1    lisinopril (PRINIVIL,ZESTRIL) 10 MG tablet Take 1 tablet (10 mg total) by mouth daily. Qty: 30 tablet, Refills: 1    metoprolol succinate (TOPROL-XL) 25 MG 24 hr tablet Take 1 tablet (25 mg total) by mouth daily. Qty: 30 tablet, Refills: 1    rivaroxaban (XARELTO) 20 MG TABS tablet Take 1 tablet (20 mg total) by mouth daily with supper. Qty: 30 tablet, Refills: 1      CONTINUE these medications which have CHANGED   Details  atorvastatin (LIPITOR) 20 MG tablet Take 1 tablet (20 mg total) by mouth at bedtime. Qty: 30 tablet, Refills: 1    furosemide (LASIX) 40 MG tablet Take 0.5 tablets (20 mg total) by mouth daily. Qty:  30 tablet, Refills: 1      CONTINUE these medications which have NOT CHANGED   Details  acetaminophen (RA ACETAMINOPHEN) 650 MG CR tablet Take 650 mg by mouth every 8 (eight) hours as needed. pain    albuterol (PROVENTIL HFA;VENTOLIN HFA) 108 (90 BASE) MCG/ACT inhaler Inhale 2 puffs into the lungs every 6 (six) hours as needed for wheezing or shortness of breath. Qty: 1 Inhaler, Refills: 0    bisacodyl (DULCOLAX) 5 MG EC tablet Take 5 mg by mouth 2 (two) times daily as needed for mild constipation.    divalproex (DEPAKOTE ER) 500 MG 24 hr tablet Take 1,000 mg by mouth 2 (two) times daily.    ferrous sulfate 325 (65 FE) MG tablet Take 325 mg by mouth daily with breakfast.    gabapentin (NEURONTIN) 600 MG tablet Take 600 mg by mouth 2 (two) times daily.    levothyroxine (SYNTHROID, LEVOTHROID) 125 MCG tablet  Take 125 mcg by mouth daily before breakfast.    Omega-3 Fatty Acids (FISH OIL) 1000 MG CAPS Take 1,000 mg by mouth daily.    OXYGEN Inhale 2 L/min into the lungs as needed.    polyethylene glycol (MIRALAX / GLYCOLAX) packet Take 17 g by mouth daily as needed for mild constipation.     potassium chloride SA (K-DUR,KLOR-CON) 20 MEQ tablet Take 1 tablet (20 mEq total) by mouth daily. Qty: 30 tablet, Refills: 0    risperiDONE (RISPERDAL) 2 MG tablet Take 2 mg by mouth at bedtime.    zolpidem (AMBIEN) 5 MG tablet Take 5 mg by mouth at bedtime.      STOP taking these medications     amLODipine (NORVASC) 5 MG tablet      lisinopril-hydrochlorothiazide (PRINZIDE,ZESTORETIC) 20-12.5 MG tablet      metoprolol tartrate (LOPRESSOR) 25 MG tablet      miconazole (MICONAZOLE 7) 2 % vaginal cream      gabapentin (NEURONTIN) 100 MG capsule        Allergies  Allergen Reactions  . Methimazole Hives and Rash  . Propylthiouracil Hives  . Chocolate     Sneezing, coughing  . Other Other (See Comments)    Corn, cabbage, greens = Rectal bleeding  . Peanut-Containing Drug Products     Sneezing,coughing  . Penicillins Itching and Cough    Sneezing Has patient had a PCN reaction causing immediate rash, facial/tongue/throat swelling, SOB or lightheadedness with hypotension: Yes Has patient had a PCN reaction causing severe rash involving mucus membranes or skin necrosis: Unknown Has patient had a PCN reaction that required hospitalization: Unknown Has patient had a PCN reaction occurring within the last 10 years: No If all of the above answers are "NO", then may proceed with Cephalosporin use.   . Vicodin [Hydrocodone-Acetaminophen] Rash   Follow-up Information    Mertie Moores, MD. Schedule an appointment as soon as possible for a visit in 2 week(s).   Specialty:  Cardiology Why:  Office will call for followup Contact information: [place]  [id number] [phone]           The results of significant diagnostics from this hospitalization (including imaging, microbiology, ancillary and laboratory) are listed below for reference.    Significant Diagnostic Studies: Dg Chest 2 View  Result Date: 07/13/2016 CLINICAL DATA:  Slurred speech. EXAM: CHEST  2 VIEW COMPARISON:  Radiographs of April 30, 2016. FINDINGS: Stable cardiomediastinal silhouette. Sternotomy wires are noted. No pneumothorax or pleural effusion is noted. Bony  thorax is unremarkable. Slightly improved linear densities are noted in the perihilar regions consistent with improving subsegmental atelectasis. IMPRESSION: Mildly improved bilateral parahilar subsegmental atelectasis. Electronically Signed   By: Marijo Conception, M.D.   On: 07/13/2016 14:56   Dg Chest 2 View  Result Date: 06/30/2016 CLINICAL DATA:  Initial evaluation for medical clearance. Mental health admission. EXAM: CHEST  2 VIEW COMPARISON:  Prior radiograph from 04/07/2016. FINDINGS: Median sternotomy wires noted, stable. Mild cardiomegaly is unchanged. Mediastinal silhouette within normal limits. Atheromatous plaque within the aortic arch. Lungs are normally inflated. Mild perihilar vascular congestion without pulmonary edema. No pleural effusion. The scarring at the mid left lung again noted, similar to prior. Streaky right perihilar opacities also compatible with scarring and/or atelectasis. No consolidative airspace disease. No pneumothorax. Surgical clips overlie the lower neck. No acute osseous abnormality. IMPRESSION: 1. Mild perihilar vascular congestion without overt pulmonary edema. 2. Scarring at the left mid and lower lung, similar to prior. Streaky right perihilar opacities also compatible with atelectasis and/or scarring. 3. Aortic atherosclerosis. Electronically Signed   By: Jeannine Boga M.D.   On: 06/30/2016 20:48   Ct Head Wo Contrast  Result Date: 07/13/2016 CLINICAL DATA:  Slurred speech  beginning last night. EXAM: CT HEAD WITHOUT CONTRAST TECHNIQUE: Contiguous axial images were obtained from the base of the skull through the vertex without intravenous contrast. COMPARISON:  MRI 10/07/2015 FINDINGS: Brain: No acute intracranial abnormality. Specifically, no hemorrhage, hydrocephalus, mass lesion, acute infarction, or significant intracranial injury. Vascular: No hyperdense vessel or unexpected calcification. Skull: No acute calvarial abnormality Sinuses/Orbits: Visualized paranasal sinuses and mastoids clear. Orbital soft tissues unremarkable. Other: None IMPRESSION: No acute intracranial abnormality. Electronically Signed   By: Rolm Baptise M.D.   On: 07/13/2016 14:45   Ct Angio Chest Pe W And/or Wo Contrast  Result Date: 07/13/2016 CLINICAL DATA:  Tachycardia, shoulder pain. EXAM: CT ANGIOGRAPHY CHEST WITH CONTRAST TECHNIQUE: Multidetector CT imaging of the chest was performed using the standard protocol during bolus administration of intravenous contrast. Multiplanar CT image reconstructions and MIPs were obtained to evaluate the vascular anatomy. CONTRAST:  100 mL of Isovue 370 intravenously. COMPARISON:  CT scan of June 22, 2015. FINDINGS: No pneumothorax or pleural effusion is noted. Mild bibasilar subsegmental atelectasis is noted. There is no evidence of large central pulmonary embolus. However, due to limitation in terms of timing of contrast bolus, smaller peripheral emboli in lower lobe branches cannot be excluded on the basis of this exam. Status post coronary artery bypass graft. Atherosclerosis of thoracic aorta is noted without aneurysm or dissection. Visualized portion of upper abdomen is unremarkable. No mediastinal mass or adenopathy is noted. Review of the MIP images confirms the above findings. IMPRESSION: Aortic atherosclerosis. Mild bibasilar subsegmental atelectasis. No evidence of large central pulmonary embolus. However, due to limitations of contrast bolus, smaller  peripheral emboli in lower lobe branches cannot be excluded on the basis of this exam. Electronically Signed   By: Marijo Conception, M.D.   On: 07/13/2016 17:11   Mr Brain Wo Contrast  Result Date: 07/14/2016 CLINICAL DATA:  65 y/o F; presenting with right facial droop and slurred speech beginning 4 days ago. EXAM: MRI HEAD WITHOUT CONTRAST MRA HEAD WITHOUT CONTRAST TECHNIQUE: Multiplanar, multiecho pulse sequences of the brain and surrounding structures were obtained without intravenous contrast. Angiographic images of the head were obtained using MRA technique without contrast. COMPARISON:  CT of head dated 07/13/2016.  MRI brain 10/07/2015. FINDINGS: MRI HEAD FINDINGS Brain: No  diffusion restriction to suggest acute infarct. There are punctate foci of susceptibility hypointensity within the cerebellum and bilateral frontal and parietal subcortical white matter without significant interval change compatible with prior microhemorrhage. Motion artifact partially degrades the FLAIR sequence. 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 The left parietal and temporal infarcts have mild foraminal 7 staining. No focal mass effect. Mild parenchymal atrophy. Extra-axial space: No hydrocephalus. No midline shift. No effacement of basilar cisterns. No extra-axial collection is identified. Proximal intracranial flow voids are maintained. No abnormality of the cervical medullary junction. Other: No abnormal signal of the paranasal sinuses. Partial opacification of right mastoid air cells. Orbits are unremarkable. Calvarium is unremarkable. 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 Anterior cerebral arteries:  Patent. Middle cerebral arteries: Patent. Anterior communicating artery: Patent. Posterior  communicating arteries:  Patent. Posterior cerebral arteries: Patent bilaterally. Small left P1 segment with large left posterior communicating artery consistent with fetal circulation, normal variant. Basilar artery:  Patent. Vertebral arteries:  Patent.  Left dominant. No evidence of significant stenosis or large vessel occlusion. IMPRESSION: 1. No evidence of acute/early subacute infarct or new intracranial hemorrhage. 2. 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 3. Stable parenchymal volume loss and chronic infarcts within left temporal, left parietal, and right cerebellum. 4. No significant stenosis or large vessel occlusion of the circle of Willis. These results will be called to the ordering clinician or representative by the Radiologist Assistant, and communication documented in the PACS or zVision Dashboard. Electronically Signed   By: Kristine Garbe M.D.   On: 07/14/2016 01:58   Mr Jodene Nam Head/brain [id number] Cm  Result Date: 07/14/2016 CLINICAL DATA:  65 y/o F; presenting with right facial droop and slurred speech beginning 4 days ago. EXAM: MRI HEAD WITHOUT CONTRAST MRA HEAD WITHOUT CONTRAST TECHNIQUE: Multiplanar, multiecho pulse sequences of the brain and surrounding structures were obtained without intravenous contrast. Angiographic images of the head were obtained using MRA technique without contrast. COMPARISON:  CT of head dated 07/13/2016.  MRI brain 10/07/2015. FINDINGS: MRI HEAD FINDINGS Brain: No diffusion restriction to suggest acute infarct. There are punctate foci of susceptibility hypointensity within the cerebellum and bilateral frontal and parietal subcortical white matter without significant interval change compatible with prior microhemorrhage. Motion artifact partially degrades the FLAIR sequence. 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 The left parietal and  temporal infarcts have mild foraminal 7 staining. No focal mass effect. Mild parenchymal atrophy. Extra-axial space: No hydrocephalus. No midline shift. No effacement of basilar cisterns. No extra-axial collection is identified. Proximal intracranial flow voids are maintained. No abnormality of the cervical medullary junction. Other: No abnormal signal of the paranasal sinuses. Partial opacification of right mastoid air cells. Orbits are unremarkable. Calvarium is unremarkable. 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 Anterior cerebral arteries:  Patent. Middle cerebral arteries: Patent. Anterior communicating artery: Patent. Posterior communicating arteries:  Patent. Posterior cerebral arteries: Patent bilaterally. Small left P1 segment with large left posterior communicating artery consistent with fetal circulation, normal variant. Basilar artery:  Patent. Vertebral arteries:  Patent.  Left dominant. No evidence of significant  stenosis or large vessel occlusion. IMPRESSION: 1. No evidence of acute/early subacute infarct or new intracranial hemorrhage. 2. 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 3. Stable parenchymal volume loss and chronic infarcts within left temporal, left parietal, and right cerebellum. 4. No significant stenosis or large vessel occlusion of the circle of Willis. These results will be called to the ordering clinician or representative by the Radiologist Assistant, and communication documented in the PACS or zVision Dashboard. Electronically Signed   By: Kristine Garbe M.D.   On: 07/14/2016 01:58    Microbiology: Recent Results (from the past 240 hour(s))  MRSA PCR Screening     Status: Abnormal   Collection Time: 07/13/16  6:46 PM  Result Value Ref Range Status   MRSA by PCR POSITIVE  (A) NEGATIVE Final    Comment:        The GeneXpert MRSA Assay (FDA approved for NASAL specimens only), is one component of a comprehensive MRSA colonization surveillance program. It is not intended to diagnose MRSA infection nor to guide or monitor treatment for MRSA infections. RESULT CALLED TO, READ BACK BY AND VERIFIED WITH: T IRBY RN 2302 07/13/16 A BROWNING      Labs: Basic Metabolic Panel:  Recent Labs Lab 07/13/16 1335 07/13/16 2206 07/14/16 0607 07/17/16 0959 07/18/16 0313  NA 140  --  139 139 140  K 4.3  --  3.9 4.1 4.4  CL 99*  --  96* 100* 100*  CO2 34*  --  37* 33* 34*  GLUCOSE 134*  --  100* 152* 81  BUN 32*  --  19 11 20   CREATININE 0.94  --  0.88 0.92 1.03*  CALCIUM 8.8*  --  8.6* 8.6* 8.8*  MG 1.8 1.9  --   --   --    Liver Function Tests:  Recent Labs Lab 07/13/16 1335 07/14/16 0607  AST 11* 11*  ALT 10* 9*  ALKPHOS 49 51  BILITOT 0.2* 0.3  PROT 7.7 7.0  ALBUMIN 2.9* 2.7*   CBC:  Recent Labs Lab 07/13/16 1335 07/14/16 0607 07/17/16 0959 07/18/16 0313  WBC 6.6 5.9 6.6 7.4  NEUTROABS 3.9  --   --   --   HGB 9.1* 8.8* 8.8* 8.4*  HCT 30.8* 30.6* 30.5* 29.7*  MCV 85.6 86.2 85.4 86.1  PLT 298 280 236 253   Cardiac Enzymes:  Recent Labs Lab 07/13/16 1335 07/13/16 2206 07/14/16 0324 07/14/16 0607  TROPONINI <0.03 <0.03 <0.03 <0.03   BNP: BNP (last 3 results)  Recent Labs  10/06/15 1800 10/21/15 1235 07/13/16 1335  BNP 532.1* 152.1* 328.4*    Signed:  Barton Dubois MD.  [facility] 07/18/2016, 3:41 PM

## 2016-07-18 NOTE — Progress Notes (Signed)
Subjective:  She was moved to the floor overnight.  Currently no shortness of breath or chest pain.  Has remained in sinus rhythm.  Tolerated addition of heart failure medicines well.  Objective:  Vital Signs in the last 24 hours: BP 113/64 (BP Location: Left Arm)   Pulse 66   Temp 98.7 F (37.1 C) (Oral)   Resp 20   Ht 5' (1.524 m)   Wt 125.2 kg (276 lb)   SpO2 92%   BMI 53.90 kg/m   Physical Exam: Severely obese black female sitting up in bed side in chair. Lungs:  Clear Cardiac:  Regular rhythm, normal S1 and S2, no S3 Extremities:  No edema present  Intake/Output from previous day: 08/26 0701 - 08/27 0700 In: 840 [P.O.:840] Out: 150 [Urine:150]  Weight Filed Weights   07/13/16 1315 07/17/16 1334 07/18/16 0400  Weight: 117.9 kg (260 lb) 124.4 kg (274 lb 4.8 oz) 125.2 kg (276 lb)    Telemetry: Sinus rhythm since cardioversion   Assessment/Plan:  1.  Atypical atrial flutter currently maintaining sinus rhythm 2.  Systolic heart failure chronic may be due to tachycardia - appears compensated now.  Not really sure what to do with the weights as they don't appear to be consistent or accurate.   3.  Severe morbid obesity 4.  Hypertensive heart disease  Recommendations:  She feels well and is currently on the floor now.  She is okay to go home from a cardiovascular viewpoint today.  She needs to be established for care follow-up.  Cardiology follow-up in one to 2 weeks to follow-up amiodarone as well as the recent atrial flutter conversion.     Kerry Hough  MD [facility]  07/18/2016, 11:02 AM

## 2016-07-18 NOTE — Progress Notes (Signed)
CM met with pt and gave her two options to secure a PCP: Call the [facility] resource number on her AVS; follow the prompts to get a PCP;    OR Look on the back of her [facility] insurance card and call the 800/888 number and get a PCP.  Pt verbalized understanding.  No other CM needs were communicated.

## 2016-07-19 ENCOUNTER — Emergency Department (HOSPITAL_BASED_OUTPATIENT_CLINIC_OR_DEPARTMENT_OTHER): Payer: Medicare Other

## 2016-07-19 ENCOUNTER — Encounter (HOSPITAL_COMMUNITY): Payer: Self-pay | Admitting: Cardiology

## 2016-07-19 ENCOUNTER — Inpatient Hospital Stay (HOSPITAL_BASED_OUTPATIENT_CLINIC_OR_DEPARTMENT_OTHER)
Admission: EM | Admit: 2016-07-19 | Discharge: 2016-07-24 | DRG: 291 | Disposition: A | Discharge status: Skilled Nursing Facility | Payer: Medicare Other | Attending: Internal Medicine | Admitting: Internal Medicine

## 2016-07-19 DIAGNOSIS — Z8249 Family history of ischemic heart disease and other diseases of the circulatory system: Secondary | ICD-10-CM | POA: Diagnosis not present

## 2016-07-19 DIAGNOSIS — J9621 Acute and chronic respiratory failure with hypoxia: Secondary | ICD-10-CM

## 2016-07-19 DIAGNOSIS — I519 Heart disease, unspecified: Secondary | ICD-10-CM | POA: Diagnosis not present

## 2016-07-19 DIAGNOSIS — I2510 Atherosclerotic heart disease of native coronary artery without angina pectoris: Secondary | ICD-10-CM | POA: Diagnosis present

## 2016-07-19 DIAGNOSIS — Z7982 Long term (current) use of aspirin: Secondary | ICD-10-CM

## 2016-07-19 DIAGNOSIS — E872 Acidosis, unspecified: Secondary | ICD-10-CM

## 2016-07-19 DIAGNOSIS — Z6841 Body Mass Index (BMI) 40.0 and over, adult: Secondary | ICD-10-CM | POA: Diagnosis not present

## 2016-07-19 DIAGNOSIS — R079 Chest pain, unspecified: Secondary | ICD-10-CM

## 2016-07-19 DIAGNOSIS — Z9981 Dependence on supplemental oxygen: Secondary | ICD-10-CM

## 2016-07-19 DIAGNOSIS — Z880 Allergy status to penicillin: Secondary | ICD-10-CM

## 2016-07-19 DIAGNOSIS — Z8673 Personal history of transient ischemic attack (TIA), and cerebral infarction without residual deficits: Secondary | ICD-10-CM | POA: Diagnosis not present

## 2016-07-19 DIAGNOSIS — I483 Typical atrial flutter: Secondary | ICD-10-CM | POA: Diagnosis not present

## 2016-07-19 DIAGNOSIS — E039 Hypothyroidism, unspecified: Secondary | ICD-10-CM | POA: Diagnosis present

## 2016-07-19 DIAGNOSIS — R0902 Hypoxemia: Secondary | ICD-10-CM | POA: Diagnosis not present

## 2016-07-19 DIAGNOSIS — Q2112 Patent foramen ovale: Secondary | ICD-10-CM

## 2016-07-19 DIAGNOSIS — Q211 Atrial septal defect: Secondary | ICD-10-CM

## 2016-07-19 DIAGNOSIS — E662 Morbid (severe) obesity with alveolar hypoventilation: Secondary | ICD-10-CM | POA: Diagnosis present

## 2016-07-19 DIAGNOSIS — I5023 Acute on chronic systolic (congestive) heart failure: Secondary | ICD-10-CM

## 2016-07-19 DIAGNOSIS — I5043 Acute on chronic combined systolic (congestive) and diastolic (congestive) heart failure: Secondary | ICD-10-CM | POA: Diagnosis present

## 2016-07-19 DIAGNOSIS — Z22322 Carrier or suspected carrier of Methicillin resistant Staphylococcus aureus: Secondary | ICD-10-CM

## 2016-07-19 DIAGNOSIS — Z91010 Allergy to peanuts: Secondary | ICD-10-CM | POA: Diagnosis not present

## 2016-07-19 DIAGNOSIS — K649 Unspecified hemorrhoids: Secondary | ICD-10-CM | POA: Diagnosis present

## 2016-07-19 DIAGNOSIS — Z91018 Allergy to other foods: Secondary | ICD-10-CM

## 2016-07-19 DIAGNOSIS — Z9119 Patient's noncompliance with other medical treatment and regimen: Secondary | ICD-10-CM

## 2016-07-19 DIAGNOSIS — Z79899 Other long term (current) drug therapy: Secondary | ICD-10-CM

## 2016-07-19 DIAGNOSIS — Z85850 Personal history of malignant neoplasm of thyroid: Secondary | ICD-10-CM

## 2016-07-19 DIAGNOSIS — R29810 Facial weakness: Secondary | ICD-10-CM | POA: Diagnosis present

## 2016-07-19 DIAGNOSIS — D649 Anemia, unspecified: Secondary | ICD-10-CM | POA: Diagnosis present

## 2016-07-19 DIAGNOSIS — F319 Bipolar disorder, unspecified: Secondary | ICD-10-CM | POA: Diagnosis present

## 2016-07-19 DIAGNOSIS — J189 Pneumonia, unspecified organism: Secondary | ICD-10-CM | POA: Diagnosis not present

## 2016-07-19 DIAGNOSIS — D151 Benign neoplasm of heart: Secondary | ICD-10-CM | POA: Diagnosis not present

## 2016-07-19 DIAGNOSIS — J9622 Acute and chronic respiratory failure with hypercapnia: Secondary | ICD-10-CM | POA: Diagnosis not present

## 2016-07-19 DIAGNOSIS — Z885 Allergy status to narcotic agent status: Secondary | ICD-10-CM | POA: Diagnosis not present

## 2016-07-19 DIAGNOSIS — Z9229 Personal history of other drug therapy: Secondary | ICD-10-CM

## 2016-07-19 DIAGNOSIS — I4892 Unspecified atrial flutter: Secondary | ICD-10-CM | POA: Diagnosis present

## 2016-07-19 DIAGNOSIS — Z7901 Long term (current) use of anticoagulants: Secondary | ICD-10-CM | POA: Diagnosis not present

## 2016-07-19 DIAGNOSIS — J9600 Acute respiratory failure, unspecified whether with hypoxia or hypercapnia: Secondary | ICD-10-CM | POA: Diagnosis not present

## 2016-07-19 DIAGNOSIS — D72829 Elevated white blood cell count, unspecified: Secondary | ICD-10-CM

## 2016-07-19 DIAGNOSIS — D509 Iron deficiency anemia, unspecified: Secondary | ICD-10-CM | POA: Diagnosis not present

## 2016-07-19 DIAGNOSIS — G458 Other transient cerebral ischemic attacks and related syndromes: Secondary | ICD-10-CM | POA: Diagnosis not present

## 2016-07-19 DIAGNOSIS — I110 Hypertensive heart disease with heart failure: Principal | ICD-10-CM | POA: Diagnosis present

## 2016-07-19 LAB — CBC AND DIFFERENTIAL
HCT: 29 % — AB (ref 36–46)
HCT: 29 % — AB (ref 36–46)
HEMOGLOBIN: 8.4 g/dL — AB (ref 12.0–16.0)
Hemoglobin: 8.4 g/dL — AB (ref 12.0–16.0)
PLATELETS: 262 10*3/uL (ref 150–399)
Platelets: 262 10*3/uL (ref 150–399)
WBC: 13.5 10*3/mL
WBC: 13.5 10^3/mL

## 2016-07-19 LAB — COMPREHENSIVE METABOLIC PANEL
ALK PHOS: 53 U/L (ref 38–126)
ALT: 10 U/L — ABNORMAL LOW (ref 14–54)
ANION GAP: 8 (ref 5–15)
AST: 14 U/L — ABNORMAL LOW (ref 15–41)
Albumin: 2.9 g/dL — ABNORMAL LOW (ref 3.5–5.0)
BUN: 22 mg/dL — ABNORMAL HIGH (ref 6–20)
CALCIUM: 8.5 mg/dL — AB (ref 8.9–10.3)
CO2: 30 mmol/L (ref 22–32)
Chloride: 101 mmol/L (ref 101–111)
Creatinine, Ser: 0.87 mg/dL (ref 0.44–1.00)
GFR calc non Af Amer: >60 mL/min (ref >60)
Glucose, Bld: 109 mg/dL — ABNORMAL HIGH (ref 65–99)
Potassium: 4 mmol/L (ref 3.5–5.1)
SODIUM: 139 mmol/L (ref 135–145)
TOTAL PROTEIN: 7.5 g/dL (ref 6.5–8.1)
Total Bilirubin: 0.1 mg/dL — ABNORMAL LOW (ref 0.3–1.2)

## 2016-07-19 LAB — I-STAT ARTERIAL BLOOD GAS, ED
ACID-BASE EXCESS: 9 mmol/L — AB (ref 0.0–2.0)
ACID-BASE EXCESS: 13 mmol/L — AB (ref 0.0–2.0)
BICARBONATE: 35.5 meq/L — AB (ref 20.0–24.0)
Bicarbonate: 38.3 mEq/L — ABNORMAL HIGH (ref 20.0–24.0)
O2 SAT: 69 %
O2 Saturation: 88 %
PCO2 ART: 55.8 mmHg — AB (ref 35.0–45.0)
PH ART: 7.383 (ref 7.350–7.450)
PO2 ART: 36 mmHg — AB (ref 80.0–100.0)
TCO2: 37 mmol/L (ref 0–100)
TCO2: 40 mmol/L (ref 0–100)
pCO2 arterial: 59.6 mmHg (ref 35.0–45.0)
pH, Arterial: 7.444 (ref 7.350–7.450)
pO2, Arterial: 57 mmHg — ABNORMAL LOW (ref 80.0–100.0)

## 2016-07-19 LAB — URINALYSIS, ROUTINE W REFLEX MICROSCOPIC
Bilirubin Urine: NEGATIVE
Glucose, UA: NEGATIVE mg/dL
Hgb urine dipstick: NEGATIVE
Ketones, ur: NEGATIVE mg/dL
LEUKOCYTES UA: NEGATIVE
NITRITE: NEGATIVE
PH: 6 (ref 5.0–8.0)
Protein, ur: NEGATIVE mg/dL
SPECIFIC GRAVITY, URINE: 1.022 (ref 1.005–1.030)

## 2016-07-19 LAB — CBC WITH DIFFERENTIAL/PLATELET
BASOS PCT: 0 %
BLASTS: 0 %
Band Neutrophils: 0 %
Basophils Absolute: 0 10*3/uL (ref 0.0–0.1)
Eosinophils Absolute: 0 10*3/uL (ref 0.0–0.7)
Eosinophils Relative: 0 %
HEMATOCRIT: 28.9 % — AB (ref 36.0–46.0)
Hemoglobin: 8.4 g/dL — ABNORMAL LOW (ref 12.0–15.0)
LYMPHS PCT: 5 %
Lymphs Abs: 0.7 10*3/uL (ref 0.7–4.0)
MCH: 24.8 pg — AB (ref 26.0–34.0)
MCHC: 29.1 g/dL — AB (ref 30.0–36.0)
MCV: 85.3 fL (ref 78.0–100.0)
Metamyelocytes Relative: 0 %
Monocytes Absolute: 1.8 10*3/uL — ABNORMAL HIGH (ref 0.1–1.0)
Monocytes Relative: 13 %
Myelocytes: 0 %
NEUTROS ABS: 11 10*3/uL — AB (ref 1.7–7.7)
NEUTROS PCT: 82 %
NRBC: 0 /100{WBCs}
PROMYELOCYTES ABS: 0 %
Platelets: 262 10*3/uL (ref 150–400)
RBC: 3.39 MIL/uL — AB (ref 3.87–5.11)
RDW: 17.2 % — AB (ref 11.5–15.5)
WBC: 13.5 10*3/uL — AB (ref 4.0–10.5)

## 2016-07-19 LAB — MRSA PCR SCREENING: MRSA by PCR: NEGATIVE

## 2016-07-19 LAB — BASIC METABOLIC PANEL
BUN: 22 mg/dL — AB (ref 4–21)
BUN: 22 mg/dL — AB (ref 4–21)
CREATININE: 0.9 mg/dL (ref 0.5–1.1)
CREATININE: 0.9 mg/dL (ref 0.5–1.1)
GLUCOSE: 109 mg/dL
Glucose: 109 mg/dL
POTASSIUM: 4 mmol/L (ref 3.4–5.3)
POTASSIUM: 4 mmol/L (ref 3.4–5.3)
SODIUM: 139 mmol/L (ref 137–147)
SODIUM: 139 mmol/L (ref 137–147)

## 2016-07-19 LAB — HEPATIC FUNCTION PANEL
ALK PHOS: 53 U/L (ref 25–125)
ALT: 10 U/L (ref 7–35)
AST: 14 U/L (ref 13–35)
Bilirubin, Total: 0.1 mg/dL
Bilirubin, Total: 0.1 mg/dL

## 2016-07-19 LAB — PROTIME-INR
INR: 1.1
Prothrombin Time: 14.2 seconds (ref 11.4–15.2)

## 2016-07-19 LAB — BRAIN NATRIURETIC PEPTIDE: B Natriuretic Peptide: 348.4 pg/mL — ABNORMAL HIGH (ref 0.0–100.0)

## 2016-07-19 LAB — TROPONIN I

## 2016-07-19 LAB — I-STAT CG4 LACTIC ACID, ED: Lactic Acid, Venous: 2.29 mmol/L (ref 0.5–1.9)

## 2016-07-19 LAB — PROCALCITONIN

## 2016-07-19 MED ORDER — PIPERACILLIN-TAZOBACTAM 3.375 G IVPB 30 MIN
3.3750 g | Freq: Once | INTRAVENOUS | Status: AC
  Administered 2016-07-19: 3.375 g via INTRAVENOUS
  Filled 2016-07-19 (×2): qty 50

## 2016-07-19 MED ORDER — ALBUTEROL SULFATE (2.5 MG/3ML) 0.083% IN NEBU: 3.0000 mL | INHALATION_SOLUTION | Freq: Four times a day (QID) | RESPIRATORY_TRACT | Status: DC | PRN

## 2016-07-19 MED ORDER — RIVAROXABAN 20 MG PO TABS
20.0000 mg | ORAL_TABLET | Freq: Every day | ORAL | Status: DC
  Administered 2016-07-20 – 2016-07-21 (×2): 20 mg via ORAL
  Filled 2016-07-19 (×2): qty 1

## 2016-07-19 MED ORDER — AMIODARONE HCL 200 MG PO TABS
200.0000 mg | ORAL_TABLET | Freq: Two times a day (BID) | ORAL | Status: DC
  Administered 2016-07-19 – 2016-07-24 (×10): 200 mg via ORAL
  Filled 2016-07-19 (×10): qty 1

## 2016-07-19 MED ORDER — RISPERIDONE 2 MG PO TABS
2.0000 mg | ORAL_TABLET | Freq: Every day | ORAL | Status: DC
  Administered 2016-07-19 – 2016-07-23 (×5): 2 mg via ORAL
  Filled 2016-07-19 (×2): qty 1
  Filled 2016-07-19: qty 4
  Filled 2016-07-19 (×2): qty 1

## 2016-07-19 MED ORDER — ATORVASTATIN CALCIUM 20 MG PO TABS
20.0000 mg | ORAL_TABLET | Freq: Every day | ORAL | Status: DC
  Administered 2016-07-19 – 2016-07-23 (×5): 20 mg via ORAL
  Filled 2016-07-19 (×5): qty 1

## 2016-07-19 MED ORDER — POLYETHYLENE GLYCOL 3350 17 G PO PACK: 17.0000 g | PACK | Freq: Every day | ORAL | Status: DC | PRN

## 2016-07-19 MED ORDER — SODIUM CHLORIDE 0.9 % IV SOLN
2500.0000 mg | Freq: Once | INTRAVENOUS | Status: AC
  Administered 2016-07-19: 2500 mg via INTRAVENOUS
  Filled 2016-07-19: qty 2500

## 2016-07-19 MED ORDER — BISACODYL 5 MG PO TBEC: 5.0000 mg | DELAYED_RELEASE_TABLET | Freq: Two times a day (BID) | ORAL | Status: DC | PRN

## 2016-07-19 MED ORDER — LEVOTHYROXINE SODIUM 25 MCG PO TABS: 125.0000 ug | ORAL_TABLET | Freq: Every day | ORAL | Status: DC

## 2016-07-19 MED ORDER — FUROSEMIDE 10 MG/ML IJ SOLN
40.0000 mg | Freq: Once | INTRAMUSCULAR | Status: AC
  Administered 2016-07-19: 40 mg via INTRAVENOUS
  Filled 2016-07-19: qty 4

## 2016-07-19 MED ORDER — PIPERACILLIN-TAZOBACTAM 3.375 G IVPB
3.3750 g | Freq: Three times a day (TID) | INTRAVENOUS | Status: DC
  Administered 2016-07-19 – 2016-07-20 (×2): 3.375 g via INTRAVENOUS
  Filled 2016-07-19 (×4): qty 50

## 2016-07-19 MED ORDER — GABAPENTIN 600 MG PO TABS
600.0000 mg | ORAL_TABLET | Freq: Two times a day (BID) | ORAL | Status: DC
  Administered 2016-07-19 – 2016-07-24 (×10): 600 mg via ORAL
  Filled 2016-07-19 (×11): qty 1

## 2016-07-19 MED ORDER — SODIUM CHLORIDE 0.9 % IV BOLUS (SEPSIS)
1000.0000 mL | Freq: Once | INTRAVENOUS | Status: AC
  Administered 2016-07-19: 1000 mL via INTRAVENOUS

## 2016-07-19 MED ORDER — ZOLPIDEM TARTRATE 5 MG PO TABS
5.0000 mg | ORAL_TABLET | Freq: Every evening | ORAL | Status: DC | PRN
  Administered 2016-07-19 – 2016-07-22 (×2): 5 mg via ORAL
  Filled 2016-07-19 (×2): qty 1

## 2016-07-19 MED ORDER — DIVALPROEX SODIUM ER 500 MG PO TB24
1000.0000 mg | ORAL_TABLET | Freq: Two times a day (BID) | ORAL | Status: DC
  Administered 2016-07-19 – 2016-07-24 (×10): 1000 mg via ORAL
  Filled 2016-07-19 (×11): qty 2

## 2016-07-19 MED ORDER — METOPROLOL SUCCINATE ER 25 MG PO TB24
25.0000 mg | ORAL_TABLET | Freq: Every day | ORAL | Status: DC
  Administered 2016-07-19: 25 mg via ORAL
  Filled 2016-07-19: qty 1

## 2016-07-19 MED ORDER — ASPIRIN 81 MG PO CHEW
81.0000 mg | CHEWABLE_TABLET | Freq: Every day | ORAL | Status: DC
  Administered 2016-07-19 – 2016-07-21 (×3): 81 mg via ORAL
  Filled 2016-07-19 (×4): qty 1

## 2016-07-19 MED ORDER — SODIUM CHLORIDE 0.9 % IV BOLUS (SEPSIS): 1000.0000 mL | Freq: Once | INTRAVENOUS | Status: DC

## 2016-07-19 MED ORDER — FERROUS SULFATE 325 (65 FE) MG PO TABS: 325.0000 mg | ORAL_TABLET | Freq: Every day | ORAL | Status: DC

## 2016-07-19 MED ORDER — VANCOMYCIN HCL 500 MG IV SOLR
INTRAVENOUS | Status: AC
  Administered 2016-07-19: 15:00:00
  Filled 2016-07-19: qty 2500

## 2016-07-19 MED ORDER — VANCOMYCIN HCL IN DEXTROSE 1-5 GM/200ML-% IV SOLN
1000.0000 mg | Freq: Two times a day (BID) | INTRAVENOUS | Status: DC
  Administered 2016-07-20: 1000 mg via INTRAVENOUS
  Filled 2016-07-19 (×2): qty 200

## 2016-07-19 NOTE — ED Notes (Signed)
Hospitalist has bee repaged

## 2016-07-19 NOTE — ED Provider Notes (Addendum)
[facility] DEPT [facility] Provider Note   CSN: [id number] Arrival date & time: 07/19/16  1229     History   Chief Complaint Chief Complaint  Patient presents with  . Chest Pain  . Shortness of Breath    HPI Danell Covarrubias is a 65 y.o. female.  HPI  Remainder of history, ROS, and physical exam limited due to patient's condition (altered mental status - confusion). Additional information was obtained from either EMS or family.   Level V Caveat.   The history is provided by a relative.  Chest Pain   This is a recurrent problem. Associated symptoms include shortness of breath. Pertinent negatives include no fever and no sputum production.  Her past medical history is significant for CAD.  Shortness of Breath  This is a recurrent problem. Episode onset: Several hours. The problem has not changed since onset.Associated symptoms include chest pain and leg swelling. Pertinent negatives include no fever, no rhinorrhea and no sputum production. Associated medical issues include CAD and heart failure.  Altered Mental Status   This is a new problem. The current episode started 6 to 12 hours ago. The problem has not changed since onset.Associated symptoms include confusion.   Daughter reports that the patient's is on 3 L home oxygen 2/2 COPD however was found this morning not wearing her oxygen complaining of shortness of breath. The report the patient was taking Ambien to sleep and was heard roaming around the house overnight and found naked this am.  Past Medical History:  Diagnosis Date  . Arthritis   . Bipolar 1 disorder ([place])   . CAD (coronary artery disease)   . CHF (congestive heart failure) ([place])   . Depression   . Hypertension   . Hypothyroidism   . Obesity   . S/P CABG x 3   . Thyroid ca ([place])   . Thyroid disease     Patient Active Problem List   Diagnosis Date Noted  . Respiratory failure ([place]) 07/19/2016  . Chronic respiratory failure with hypoxia ([place])   .  Cerebral thrombosis with cerebral infarction 07/14/2016  . TIA (transient ischemic attack)   . Morbid obesity due to excess calories ([place])   . Chronic systolic congestive heart failure ([place])   . Atrial tachycardia ([place]) 07/13/2016  . Atypical atrial flutter ([place]) 07/13/2016  . Slurred speech 07/13/2016  . Chronic diastolic heart failure ([place]) 11/05/2015  . Facial droop 10/06/2015  . Acute on chronic diastolic heart failure ([place]) 10/06/2015  . Acute on chronic respiratory failure with hypoxia ([place]) 10/06/2015  . CHF exacerbation ([place]) 10/06/2015  . Hypothyroidism   . S/P CABG x 3   . CAD (coronary artery disease)   . Bipolar affective disorder, manic ([place]) 07/08/2015  . COPD (chronic obstructive pulmonary disease) ([place]) 06/26/2015  . Acute pulmonary edema ([place]) 06/24/2015  . Essential hypertension 06/23/2015  . Chronic anemia 06/23/2015  . Post-operative hypothyroidism 06/23/2015  . Shortness of breath 06/23/2015  . Pulmonary edema 06/22/2015    Past Surgical History:  Procedure Laterality Date  . ABDOMINAL HYSTERECTOMY    . CARDIOVERSION N/A 07/16/2016   Procedure: CARDIOVERSION;  Surgeon: Jerline Pain, MD;  Location: [facility];  Service: Cardiovascular;  Laterality: N/A;  . CORONARY ARTERY BYPASS GRAFT    . TEE WITHOUT CARDIOVERSION N/A 07/16/2016   Procedure: TRANSESOPHAGEAL ECHOCARDIOGRAM (TEE);  Surgeon: Jerline Pain, MD;  Location: [facility] ENDOSCOPY;  Service: Cardiovascular;  Laterality: N/A;  . TONSILLECTOMY      OB History  No data available       Home Medications    Prior to Admission medications   Medication Sig Start Date End Date Taking? Authorizing Provider  acetaminophen (RA ACETAMINOPHEN) 650 MG CR tablet Take 650 mg by mouth every 8 (eight) hours as needed for pain. pain 03/16/13  Yes Historical Provider, MD  albuterol (PROVENTIL HFA;VENTOLIN HFA) 108 (90 BASE) MCG/ACT inhaler Inhale 2 puffs into the lungs every 6 (six) hours as needed for wheezing or  shortness of breath. 06/26/15  Yes Donne Hazel, MD  amiodarone (PACERONE) 200 MG tablet Take 1 tablet (200 mg total) by mouth 2 (two) times daily. 07/18/16  Yes Barton Dubois, MD  aspirin EC 81 MG EC tablet Take 1 tablet (81 mg total) by mouth daily. 07/18/16  Yes Barton Dubois, MD  atorvastatin (LIPITOR) 20 MG tablet Take 1 tablet (20 mg total) by mouth at bedtime. 07/18/16  Yes Barton Dubois, MD  bisacodyl (DULCOLAX) 5 MG EC tablet Take 5 mg by mouth 2 (two) times daily as needed for mild constipation.   Yes Historical Provider, MD  divalproex (DEPAKOTE ER) 500 MG 24 hr tablet Take 1,000 mg by mouth 2 (two) times daily.   Yes Historical Provider, MD  ferrous sulfate 325 (65 FE) MG tablet Take 325 mg by mouth daily with breakfast.   Yes Historical Provider, MD  furosemide (LASIX) 40 MG tablet Take 0.5 tablets (20 mg total) by mouth daily. 07/18/16  Yes Barton Dubois, MD  gabapentin (NEURONTIN) 600 MG tablet Take 600 mg by mouth 2 (two) times daily.   Yes Historical Provider, MD  levothyroxine (SYNTHROID, LEVOTHROID) 125 MCG tablet Take 125 mcg by mouth daily before breakfast.   Yes Historical Provider, MD  lisinopril (PRINIVIL,ZESTRIL) 10 MG tablet Take 1 tablet (10 mg total) by mouth daily. 07/18/16  Yes Barton Dubois, MD  metoprolol succinate (TOPROL-XL) 25 MG 24 hr tablet Take 1 tablet (25 mg total) by mouth daily. 07/18/16  Yes Barton Dubois, MD  Omega-3 Fatty Acids (FISH OIL) 1000 MG CAPS Take 1,000 mg by mouth daily.   Yes Historical Provider, MD  OXYGEN Inhale 2 L into the lungs continuous.    Yes Historical Provider, MD  polyethylene glycol (MIRALAX / GLYCOLAX) packet Take 17 g by mouth daily as needed for mild constipation.    Yes Historical Provider, MD  potassium chloride SA (K-DUR,KLOR-CON) 20 MEQ tablet Take 1 tablet (20 mEq total) by mouth daily. 10/09/15  Yes Geradine Girt, DO  risperiDONE (RISPERDAL) 2 MG tablet Take 2 mg by mouth at bedtime.   Yes Historical Provider, MD  zolpidem  (AMBIEN) 5 MG tablet Take 5 mg by mouth at bedtime.   Yes Historical Provider, MD  rivaroxaban (XARELTO) 20 MG TABS tablet Take 1 tablet (20 mg total) by mouth daily with supper. 07/18/16   Barton Dubois, MD    Family History Family History  Problem Relation Age of Onset  . Hypertension Sister   . Bipolar disorder Other     Social History Social History  Substance Use Topics  . Smoking status: Never Smoker  . Smokeless tobacco: Never Used  . Alcohol use No     Allergies   Methimazole; Propylthiouracil; Chocolate; Other; Peanut-containing drug products; Penicillins; and Vicodin [hydrocodone-acetaminophen]   Review of Systems Review of Systems Unable to perform ROS: Mental status change  Constitutional: Negative for fever.  HENT: Negative for rhinorrhea.   Respiratory: Positive for shortness of breath. Negative for sputum production.  Cardiovascular: Positive for chest pain and leg swelling.  Psychiatric/Behavioral: Positive for confusion.    Physical Exam Updated Vital Signs BP 138/54  Pulse 69   Temp 99.8 F (37.7 C) (Oral)   Resp 28   Ht 5' (1.524 m)   Wt 276 lb (125.2 kg)   SpO2 75%   BMI 53.90 kg/m  Physical Exam  Constitutional: She appears well-developed and well-nourished. No distress.  obese  HENT:  Head: Normocephalic and atraumatic.  Right Ear: External ear normal.  Left Ear: External ear normal.  Nose: Nose normal.  Eyes: Conjunctivae and EOM are normal. Pupils are equal, round, and reactive to light. Right eye exhibits no discharge. Left eye exhibits no discharge. No scleral icterus.  Neck: Normal range of motion. Neck supple.  Cardiovascular: Normal rate, regular rhythm and normal heart sounds.  Exam reveals no gallop and no friction rub.   No murmur heard. Pulmonary/Chest: No stridor. Tachypnea noted. She is in respiratory distress. She has decreased breath sounds. She has no wheezes. She has no rales.  Abdominal: Soft. She exhibits no  distension. There is no tenderness.  Musculoskeletal: She exhibits no edema or tenderness.  2+ BLE pitting edema  Neurological: She is alert. She is disoriented.  Skin: Skin is warm and dry. No rash noted. She is not diaphoretic. No erythema.  Psychiatric: She has a normal mood and affect.     ED Treatments / Results  Labs (all labs ordered are listed, but only abnormal results are displayed) Labs Reviewed  COMPREHENSIVE METABOLIC PANEL - Abnormal; Notable for the following:       Result Value   Glucose, Bld 109 (*)    BUN 22 (*)    Calcium 8.5 (*)    Albumin 2.9 (*)    AST 14 (*)    ALT 10 (*)    Total Bilirubin 0.1 (*)    All other components within normal limits  CBC WITH DIFFERENTIAL/PLATELET - Abnormal; Notable for the following:    WBC 13.5 (*)    RBC 3.39 (*)    Hemoglobin 8.4 (*)    HCT 28.9 (*)    MCH 24.8 (*)    MCHC 29.1 (*)    RDW 17.2 (*)    Neutro Abs 11.0 (*)    Monocytes Absolute 1.8 (*)    All other components within normal limits  BRAIN NATRIURETIC PEPTIDE - Abnormal; Notable for the following:    B Natriuretic Peptide 348.4 (*)    All other components within normal limits  I-STAT CG4 LACTIC ACID, ED - Abnormal; Notable for the following:    Lactic Acid, Venous 2.29 (*)    All other components within normal limits  I-STAT ARTERIAL BLOOD GAS, ED - Abnormal; Notable for the following:    pCO2 arterial 59.6 (*)    pO2, Arterial 57.0 (*)    Bicarbonate 35.5 (*)    Acid-Base Excess 9.0 (*)    All other components within normal limits  I-STAT ARTERIAL BLOOD GAS, ED - Abnormal; Notable for the following:    pCO2 arterial 55.8 (*)    pO2, Arterial 36.0 (*)    Bicarbonate 38.3 (*)    Acid-Base Excess 13.0 (*)    All other components within normal limits  CULTURE, BLOOD (ROUTINE X 2)  CULTURE, BLOOD (ROUTINE X 2)  URINE CULTURE  MRSA PCR SCREENING  CULTURE, EXPECTORATED SPUTUM-ASSESSMENT  URINALYSIS, ROUTINE W REFLEX MICROSCOPIC (NOT AT [facility])    TROPONIN I  PROTIME-INR  CBC  BASIC METABOLIC PANEL  MAGNESIUM  LACTIC ACID, PLASMA  PROCALCITONIN  I-STAT CG4 LACTIC ACID, ED    EKG  EKG Interpretation  Date/Time:  Monday July 19 2016 12:45:21 EDT Ventricular Rate:  78 PR Interval:    QRS Duration: 102 QT Interval:  415 QTC Calculation: 476 R Axis:   61 Text Interpretation:  Sinus rhythm Low voltage, precordial leads Abnormal R-wave progression, early transition Borderline repolarization abnormality Confirmed by [facility] MD, PEDRO ([id number]) on 07/19/2016 2:08:37 PM       Radiology Dg Chest Port 1 View  Result Date: 07/19/2016 CLINICAL DATA:  Shortness of breath. EXAM: PORTABLE CHEST 1 VIEW COMPARISON:  07/13/2016 FINDINGS: Prior CABG. Mild cardiomegaly with vascular congestion. Low lung volumes. Linear areas of scarring or atelectasis in the lungs bilaterally. No effusions. No acute bony abnormality. IMPRESSION: Areas of mid lung atelectasis or scarring. Cardiomegaly, vascular congestion. Low lung volumes. Electronically Signed   By: Rolm Baptise M.D.   On: 07/19/2016 13:19    Procedures Procedures (including critical care time)  Medications Ordered in ED Medications  vancomycin (VANCOCIN) IVPB 1000 mg/200 mL premix (not administered)  piperacillin-tazobactam (ZOSYN) IVPB 3.375 g (not administered)  furosemide (LASIX) injection 40 mg (not administered)  albuterol (PROVENTIL) (2.5 MG/3ML) 0.083% nebulizer solution 3 mL (not administered)  amiodarone (PACERONE) tablet 200 mg (not administered)  aspirin EC tablet 81 mg (not administered)  atorvastatin (LIPITOR) tablet 20 mg (not administered)  bisacodyl (DULCOLAX) EC tablet 5 mg (not administered)  divalproex (DEPAKOTE ER) 24 hr tablet 1,000 mg (not administered)  ferrous sulfate tablet 325 mg (not administered)  gabapentin (NEURONTIN) tablet 600 mg (not administered)  levothyroxine (SYNTHROID, LEVOTHROID) tablet 125 mcg (not administered)  metoprolol succinate  (TOPROL-XL) 24 hr tablet 25 mg (not administered)  polyethylene glycol (MIRALAX / GLYCOLAX) packet 17 g (not administered)  risperiDONE (RISPERDAL) tablet 2 mg (not administered)  rivaroxaban (XARELTO) tablet 20 mg (not administered)  zolpidem (AMBIEN) tablet 5 mg (not administered)  vancomycin (VANCOCIN) 2,500 mg in sodium chloride 0.9 % 500 mL IVPB (2,500 mg Intravenous Transfusing/Transfer 07/19/16 1652)  piperacillin-tazobactam (ZOSYN) IVPB 3.375 g (0 g Intravenous Stopped 07/19/16 1436)  vancomycin (VANCOCIN) 500 MG powder (  Given 07/19/16 1452)  sodium chloride 0.9 % bolus 1,000 mL (1,000 mLs Intravenous Transfusing/Transfer 07/19/16 1652)     Initial Impression / Assessment and Plan / ED Course  I have reviewed the triage vital signs and the nursing notes.  Pertinent labs & imaging results that were available during my care of the patient were reviewed by me and considered in my medical decision making (see chart for details).  Clinical Course    Upon arrival patient was satting in the 70s on room air. She was immediately placed on nasal cannula and required 6 L for adequate saturations. Patient also noted to be confused and slightly obtunded; however patient was improving following appropriate oxygenation; decided to monitor closely. ABG revealed compensated respiratory acidosis; likely from COPD. Exam with evidence of volume overload. Chest x-ray revealed pulmonary edema/congestion. She was placed on BiPAP for COPD/CHF exacerbation. Lactic acid of 2.2. Given recent hospitalization will cover patient for possible superimposed pneumonia with vancomycin and Zosyn.   Mentation significantly improved following BiPAP. Blood pressures were normotensive initially, but did drop once being placed on BiPAP. She maintained MAPs above 65. Believe that the pressure drop was secondary to BiPAP administration resulting in decrease preload. Do not feel that this is secondary to possible infectious/sepsis  process. Patient was provided with  1 L of IV fluids.  I spoke with cardiology service who discharged the patient yesterday. They requested patient be admitted to hospitalist service given the possible superimposed infection and COPD. They will follow along regarding the patient's cardiac issues.  Spoke with Dr. Marily Memos who accepted the patient to the stepdown unit for further management.  CRITICAL CARE Performed by: Grayce Sessions Cardama Total critical care time: 35 minutes Critical care time was exclusive of separately billable procedures and treating other patients. Critical care was necessary to treat or prevent imminent or life-threatening deterioration. Critical care was time spent personally by me on the following activities: development of treatment plan with patient and/or surrogate as well as nursing, discussions with consultants, evaluation of patient's response to treatment, examination of patient, obtaining history from patient or surrogate, ordering and performing treatments and interventions, ordering and review of laboratory studies, ordering and review of radiographic studies, pulse oximetry and re-evaluation of patient's condition.   Final Clinical Impressions(s) / ED Diagnoses   Final diagnoses:  Acute on chronic combined systolic and diastolic congestive heart failure ([place])  Acute on chronic respiratory failure with hypoxia and hypercapnia ([facility])  Leukocytosis  Lactic acidosis    Disposition: Admit  Condition: fair      Fatima Blank, MD 07/19/16 2031

## 2016-07-19 NOTE — ED Notes (Signed)
EDP in room  

## 2016-07-19 NOTE — Progress Notes (Signed)
Pt took BIPAP mask off and refuses to allow [place] to place back on .  Placed pt on 5L [place] SPO2 96%.

## 2016-07-19 NOTE — ED Notes (Signed)
Pt states she feels much better on bipap. Pt is offered bedpan and specimen obtained.

## 2016-07-19 NOTE — Progress Notes (Signed)
Pt coming from [facility] for treatment of acute on chronic respiratory failure secondary to HCAP. Of note patient was discharged the day prior to admission after treatment for TIA, atrial flutter with rapid ventricular response. Pt currently requiring BIPAP. Accepted to stepdown.   Linna Darner, MD [facility] 07/19/2016, 4:09 PM

## 2016-07-19 NOTE — ED Triage Notes (Signed)
Got out of the hospital yesterday. Confused at 10am. Daughter states she not herself today. She had a TIA while in the hospital for bradycardia. Pt states she is having CP and SOB. She is alert.

## 2016-07-19 NOTE — ED Notes (Signed)
Attempted to call report to the floor but the receiving nurse is busy at this time.  She will call me back.

## 2016-07-19 NOTE — Consult Note (Signed)
CARDIOLOGY CONSULT NOTE   Patient ID: Tonya Hogan MRN: [id number], DOB/AGE: 02-16-1951   Admit date: 07/19/2016 Date of Consult: 07/19/2016   Primary Physician: No PCP Per Patient Primary Cardiologist: HF clinic - Amy Clegg  Pt. Profile  Tonya Hogan is a morbidly obese 65 yo AA female with PMH of HTN, bipolar disorder, h/o noncompliance, COPD, chronic anemia, chronic diastolic heart failure, thyroid CA --> hypothyroidism, and history of CAD s/p CABG on 01/31/2010 at [facility] presented with altered mental status and acute hypoxia the day after discharge after recent TEE cardioversion.  Problem List  Past Medical History:  Diagnosis Date  . Arthritis   . Bipolar 1 disorder ([place])   . CAD (coronary artery disease)   . CHF (congestive heart failure) ([place])   . Depression   . Hypertension   . Hypothyroidism   . Obesity   . S/P CABG x 3   . Thyroid ca ([place])   . Thyroid disease     Past Surgical History:  Procedure Laterality Date  . ABDOMINAL HYSTERECTOMY    . CARDIOVERSION N/A 07/16/2016   Procedure: CARDIOVERSION;  Surgeon: Jerline Pain, MD;  Location: [place];  Service: Cardiovascular;  Laterality: N/A;  . CORONARY ARTERY BYPASS GRAFT    . TEE WITHOUT CARDIOVERSION N/A 07/16/2016   Procedure: TRANSESOPHAGEAL ECHOCARDIOGRAM (TEE);  Surgeon: Jerline Pain, MD;  Location: [facility] ENDOSCOPY;  Service: Cardiovascular;  Laterality: N/A;  . TONSILLECTOMY       Allergies  Allergies  Allergen Reactions  . Methimazole Hives and Rash  . Propylthiouracil Hives  . Chocolate Other (See Comments)    Sneezing, coughing  . Other Other (See Comments)    Corn, cabbage, greens cause Rectal bleeding  . Peanut-Containing Drug Products Other (See Comments)    Sneezing,coughing  . Penicillins Itching, Other (See Comments) and Cough    Sneezing Has patient had a PCN reaction causing immediate rash, facial/tongue/throat swelling, SOB or lightheadedness with hypotension: Yes Has  patient had a PCN reaction causing severe rash involving mucus membranes or skin necrosis: Unknown Has patient had a PCN reaction that required hospitalization: Unknown Has patient had a PCN reaction occurring within the last 10 years: No If all of the above answers are "NO", then may proceed with Cephalosporin use.   . Vicodin [Hydrocodone-Acetaminophen] Rash    HPI   Tonya Hogan is a morbidly obese 65 yo AA female with PMH of HTN, bipolar disorder, h/o noncompliance, COPD, chronic anemia, chronic diastolic heart failure, thyroid CA --> hypothyroidism, and history of CAD s/p CABG on 01/31/2010 at [facility]. She had echocardiogram in November 2016 that showed normal LVEF. She was previously seen in the ED on 06/30/2016 for mania and transferred to [facility] for psychiatric treatment. She was admitted again on 07/13/2016 for slurred speech with questionable TIA. MRI of the brain obtained on 07/14/2016 showed no evidence of acute or subacute infarct or new intracranial hemorrhage. Echocardiogram obtained on 07/14/2016, however showed EF 30-35%. He was also noted to be in 2-1 atrial flutter with RVR. Cardiology was consulted for atrial flutter. She was placed on Xarelto given CHA2DS2-Vasc score of 6 (HTN, CAD, TIA/CVAx2, CHF, female). She eventually underwent successful TEE cardioversion on 07/16/2016 with restoration of normal sinus rhythm. She was discharged on Toprol-XL, amiodarone and Xarelto to follow-up with cardiology closely as outpatient. I did not see any ischemic workup for LV dysfunction. According to the patient, yesterday prior to discharge, she did have some chest tightness upon  standing. However she says this is different from her previous angina episodes prior to the previous CABG. She described her previous angina as a pain radiating back and forth between the left chest and right chest. She denies any shortness of breath, she says she has been using 2 L home oxygen at night at  home.  She felt fine yesterday going home, she had mild productive cough, however no fever or chill. This morning, he had another episode of chest pain. Patient was sent by his daughter to [place] ED again for altered mental status. On arrival, it appears patient was in acute respiratory acidosis. She was placed on BiPAP, however later was able to wean off BiPAP. Chest x-ray indicates possible congestion versus pneumonia, patient was admitted to hospitalist service after transfer from [place] to [facility]. Cardiology has been consulted for chest pain. The patient is currently in sinus rhythm with heart rate is in the 70s. She does have at least one to 2+ pitting edema in the legs, according to the patient this has been going on for a while. However interestingly enough, patient denies any obvious shortness of breath and does not remember altered mental status.    Inpatient Medications  . amiodarone  200 mg Oral BID  . aspirin  81 mg Oral Daily  . atorvastatin  20 mg Oral QHS  . divalproex  1,000 mg Oral BID  . [START ON 07/20/2016] ferrous sulfate  325 mg Oral Q breakfast  . furosemide  40 mg Intravenous Once  . gabapentin  600 mg Oral BID  . [START ON 07/20/2016] levothyroxine  125 mcg Oral QAC breakfast  . metoprolol succinate  25 mg Oral Daily  . piperacillin-tazobactam (ZOSYN)  IV  3.375 g Intravenous Q8H  . risperiDONE  2 mg Oral QHS  . [START ON 07/20/2016] rivaroxaban  20 mg Oral Q supper  . [START ON 07/20/2016] vancomycin  1,000 mg Intravenous Q12H    Family History Family History  Problem Relation Age of Onset  . Hypertension Sister   . Bipolar disorder Other      Social History Social History   Social History  . Marital status: Widowed    Spouse name: N/A  . Number of children: N/A  . Years of education: N/A   Occupational History  . Retired    Social History Main Topics  . Smoking status: Never Smoker  . Smokeless tobacco:  Never Used  . Alcohol use No  . Drug use: No  . Sexual activity: No   Other Topics Concern  . Not on file   Social History Narrative   Has a daughter in the area.     Review of Systems  General:  No chills, fever, night sweats or weight changes.  Cardiovascular:  No orthopnea, palpitations, paroxysmal nocturnal dyspnea. +chest pain, dyspnea on exertion, edema Dermatological: No rash, lesions/masses Respiratory: +productive cough, dyspnea Urologic: No hematuria, dysuria Abdominal:   No nausea, vomiting, diarrhea, bright red blood per rectum, melena, or hematemesis Neurologic:  No visual changes, wkns. +changes in mental status. All other systems reviewed and are otherwise negative except as noted above.  Physical Exam  Blood pressure 118/60, pulse 89, temperature 98.9 F (37.2 C), temperature source Axillary, resp. rate 15, height 5' (1.524 m), weight 283 lb 4.7 oz (128.5 kg), SpO2 96 %.  General: Pleasant, NAD Psych: Normal affect. Neuro: Alert and oriented X 3. Moves all extremities spontaneously. HEENT: Normal  Neck: Supple without  bruits or JVD. Lungs:  Resp regular and unlabored. Decreased breath sound in bilateral bases. Heart: RRR no s3, s4, or murmurs.  Abdomen: Soft, non-tender, non-distended, BS + x 4.  Extremities: No clubbing, cyanosis. DP/PT/Radials 2+ and equal bilaterally. 2+ pitting edema  Labs   Recent Labs  07/19/16 1250  TROPONINI <0.03   Lab Results  Component Value Date   WBC 13.5 (H) 07/19/2016   HGB 8.4 (L) 07/19/2016   HCT 28.9 (L) 07/19/2016   MCV 85.3 07/19/2016   PLT 262 07/19/2016    Recent Labs Lab 07/19/16 1240  NA 139  K 4.0  CL 101  CO2 30  BUN 22*  CREATININE 0.87  CALCIUM 8.5*  PROT 7.5  BILITOT 0.1*  ALKPHOS 53  ALT 10*  AST 14*  GLUCOSE 109*   Lab Results  Component Value Date   CHOL 190 07/14/2016   HDL 49 07/14/2016   LDLCALC 128 (H) 07/14/2016   TRIG 67 07/14/2016   No results found for:  DDIMER  Radiology/Studies  Dg Chest 2 View  Result Date: 07/13/2016 CLINICAL DATA:  Slurred speech. EXAM: CHEST  2 VIEW COMPARISON:  Radiographs of April 30, 2016. FINDINGS: Stable cardiomediastinal silhouette. Sternotomy wires are noted. No pneumothorax or pleural effusion is noted. Bony thorax is unremarkable. Slightly improved linear densities are noted in the perihilar regions consistent with improving subsegmental atelectasis. IMPRESSION: Mildly improved bilateral parahilar subsegmental atelectasis. Electronically Signed   By: Marijo Conception, M.D.   On: 07/13/2016 14:56   Dg Chest 2 View  Result Date: 06/30/2016 CLINICAL DATA:  Initial evaluation for medical clearance. Mental health admission. EXAM: CHEST  2 VIEW COMPARISON:  Prior radiograph from 04/07/2016. FINDINGS: Median sternotomy wires noted, stable. Mild cardiomegaly is unchanged. Mediastinal silhouette within normal limits. Atheromatous plaque within the aortic arch. Lungs are normally inflated. Mild perihilar vascular congestion without pulmonary edema. No pleural effusion. The scarring at the mid left lung again noted, similar to prior. Streaky right perihilar opacities also compatible with scarring and/or atelectasis. No consolidative airspace disease. No pneumothorax. Surgical clips overlie the lower neck. No acute osseous abnormality. IMPRESSION: 1. Mild perihilar vascular congestion without overt pulmonary edema. 2. Scarring at the left mid and lower lung, similar to prior. Streaky right perihilar opacities also compatible with atelectasis and/or scarring. 3. Aortic atherosclerosis. Electronically Signed   By: Jeannine Boga M.D.   On: 06/30/2016 20:48   Ct Head Wo Contrast  Result Date: 07/13/2016 CLINICAL DATA:  Slurred speech beginning last night. EXAM: CT HEAD WITHOUT CONTRAST TECHNIQUE: Contiguous axial images were obtained from the base of the skull through the vertex without intravenous contrast. COMPARISON:  MRI  10/07/2015 FINDINGS: Brain: No acute intracranial abnormality. Specifically, no hemorrhage, hydrocephalus, mass lesion, acute infarction, or significant intracranial injury. Vascular: No hyperdense vessel or unexpected calcification. Skull: No acute calvarial abnormality Sinuses/Orbits: Visualized paranasal sinuses and mastoids clear. Orbital soft tissues unremarkable. Other: None IMPRESSION: No acute intracranial abnormality. Electronically Signed   By: Rolm Baptise M.D.   On: 07/13/2016 14:45   Ct Angio Chest Pe W And/or Wo Contrast  Result Date: 07/13/2016 CLINICAL DATA:  Tachycardia, shoulder pain. EXAM: CT ANGIOGRAPHY CHEST WITH CONTRAST TECHNIQUE: Multidetector CT imaging of the chest was performed using the standard protocol during bolus administration of intravenous contrast. Multiplanar CT image reconstructions and MIPs were obtained to evaluate the vascular anatomy. CONTRAST:  100 mL of Isovue 370 intravenously. COMPARISON:  CT scan of June 22, 2015. FINDINGS: No pneumothorax or  pleural effusion is noted. Mild bibasilar subsegmental atelectasis is noted. There is no evidence of large central pulmonary embolus. However, due to limitation in terms of timing of contrast bolus, smaller peripheral emboli in lower lobe branches cannot be excluded on the basis of this exam. Status post coronary artery bypass graft. Atherosclerosis of thoracic aorta is noted without aneurysm or dissection. Visualized portion of upper abdomen is unremarkable. No mediastinal mass or adenopathy is noted. Review of the MIP images confirms the above findings. IMPRESSION: Aortic atherosclerosis. Mild bibasilar subsegmental atelectasis. No evidence of large central pulmonary embolus. However, due to limitations of contrast bolus, smaller peripheral emboli in lower lobe branches cannot be excluded on the basis of this exam. Electronically Signed   By: Marijo Conception, M.D.   On: 07/13/2016 17:11   Mr Brain Wo Contrast  Result  Date: 07/14/2016 CLINICAL DATA:  65 y/o F; presenting with right facial droop and slurred speech beginning 4 days ago. EXAM: MRI HEAD WITHOUT CONTRAST MRA HEAD WITHOUT CONTRAST TECHNIQUE: Multiplanar, multiecho pulse sequences of the brain and surrounding structures were obtained without intravenous contrast. Angiographic images of the head were obtained using MRA technique without contrast. COMPARISON:  CT of head dated 07/13/2016.  MRI brain 10/07/2015. FINDINGS: MRI HEAD FINDINGS Brain: No diffusion restriction to suggest acute infarct. There are punctate foci of susceptibility hypointensity within the cerebellum and bilateral frontal and parietal subcortical white matter without significant interval change compatible with prior microhemorrhage. Motion artifact partially degrades the FLAIR sequence. 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 The left parietal and temporal infarcts have mild foraminal 7 staining. No focal mass effect. Mild parenchymal atrophy. Extra-axial space: No hydrocephalus. No midline shift. No effacement of basilar cisterns. No extra-axial collection is identified. Proximal intracranial flow voids are maintained. No abnormality of the cervical medullary junction. Other: No abnormal signal of the paranasal sinuses. Partial opacification of right mastoid air cells. Orbits are unremarkable. Calvarium is unremarkable. 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 Anterior cerebral arteries:  Patent. Middle cerebral arteries: Patent. Anterior communicating artery: Patent. Posterior communicating arteries:  Patent. Posterior cerebral arteries: Patent bilaterally. Small left P1 segment with large left posterior communicating artery consistent with fetal circulation, normal variant.  Basilar artery:  Patent. Vertebral arteries:  Patent.  Left dominant. No evidence of significant stenosis or large vessel occlusion. IMPRESSION: 1. No evidence of acute/early subacute infarct or new intracranial hemorrhage. 2. 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 3. Stable parenchymal volume loss and chronic infarcts within left temporal, left parietal, and right cerebellum. 4. No significant stenosis or large vessel occlusion of the circle of Willis. These results will be called to the ordering clinician or representative by the Radiologist Assistant, and communication documented in the PACS or zVision Dashboard. Electronically Signed   By: Kristine Garbe M.D.   On: 07/14/2016 01:58   Dg Chest Port 1 View  Result Date: 07/19/2016 CLINICAL DATA:  Shortness of breath. EXAM: PORTABLE CHEST 1 VIEW COMPARISON:  07/13/2016 FINDINGS: Prior CABG. Mild cardiomegaly with vascular congestion. Low lung volumes. Linear areas of scarring or atelectasis in the lungs bilaterally. No effusions. No acute bony abnormality. IMPRESSION: Areas of mid lung atelectasis or scarring. Cardiomegaly, vascular congestion. Low lung volumes. Electronically Signed   By: Lennette Bihari  [place] M.D.   On: 07/19/2016 13:19   Mr Jodene Nam Head/brain [id number] Cm  Result Date: 07/14/2016 CLINICAL DATA:  65 y/o F; presenting with right facial droop and slurred speech beginning 4 days ago. EXAM: MRI HEAD WITHOUT CONTRAST MRA HEAD WITHOUT CONTRAST TECHNIQUE: Multiplanar, multiecho pulse sequences of the brain and surrounding structures were obtained without intravenous contrast. Angiographic images of the head were obtained using MRA technique without contrast. COMPARISON:  CT of head dated 07/13/2016.  MRI brain 10/07/2015. FINDINGS: MRI HEAD FINDINGS Brain: No diffusion restriction to suggest acute infarct. There are punctate foci of susceptibility hypointensity within the cerebellum and bilateral frontal  and parietal subcortical white matter without significant interval change compatible with prior microhemorrhage. Motion artifact partially degrades the FLAIR sequence. 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 The left parietal and temporal infarcts have mild foraminal 7 staining. No focal mass effect. Mild parenchymal atrophy. Extra-axial space: No hydrocephalus. No midline shift. No effacement of basilar cisterns. No extra-axial collection is identified. Proximal intracranial flow voids are maintained. No abnormality of the cervical medullary junction. Other: No abnormal signal of the paranasal sinuses. Partial opacification of right mastoid air cells. Orbits are unremarkable. Calvarium is unremarkable. 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 Anterior cerebral arteries:  Patent. Middle cerebral arteries: Patent. Anterior communicating artery: Patent. Posterior communicating arteries:  Patent. Posterior cerebral arteries: Patent bilaterally. Small left P1 segment with large left posterior communicating artery consistent with fetal circulation, normal variant. Basilar artery:  Patent. Vertebral arteries:  Patent.  Left dominant. No evidence of significant stenosis or large vessel occlusion. IMPRESSION: 1. No evidence of acute/early subacute infarct or new intracranial hemorrhage. 2. 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 3. Stable parenchymal volume loss and chronic infarcts within left temporal, left parietal, and right cerebellum. 4. No significant stenosis or large vessel occlusion of the circle of Willis. These results will be called to the ordering clinician or representative by the Radiologist Assistant, and communication documented in the  PACS or zVision Dashboard. Electronically Signed   By: Kristine Garbe M.D.   On: 07/14/2016 01:58    ECG  Normal sinus rhythm without significant ST-T wave changes.  ASSESSMENT AND PLAN  1. Atypical chest pain: Occurring at rest, feels different from her previous angina. Recommend observation with serial troponin. If negative and no further chest pain, consider repeat echocardiogram in 3 month after maximum medical therapy, if EF is still low, consider diagnostic cardiac catheterization.  2. Acute respiratory failure, likely multifactorial given obesity hypoventilation, possible pneumonia, and acute on chronic systolic heart failure  - Given antibiotic by hospitalist group. She did have mildly elevated lactic acid on arrival along with low-grade fever and elevated white blood cell count. Given recent hospitalization, need to treat for possible hospital-acquired pneumonia.  - Patient also has at least 2+ pitting edema in the bilateral lower extremity, it is difficult to assess her lung via physical exam and given morbid obesity, I think she is also in acute heart failure as well, however she does have decreased breath sound in bilateral bases. consider diuresis with IV Lasix.  3. CAD s/p CABG on 01/31/2010 at [facility]  4. H/o atrial myxoma, per [facility] cardiology note in December 2012, it appears she carries a diagnosis of  atrial myxoma?  5. Persistent atrial flutter s/p TEE DCCV 07/16/2016  - Controlled on amiodarone, Toprol XL and Xarelto  - This patients CHA2DS2-VASc Score and unadjusted Ischemic Stroke Rate (% per year) is equal to 9.7 % stroke rate/year from a score of 6  Above score calculated as 1 point each if present [CHF, HTN, DM, Vascular=MI/PAD/Aortic Plaque, Age if 65-74, or Female] Above score calculated as 2 points each if present [Age > 75, or Stroke/TIA/TE]    Signed, Almyra Deforest, PA-C 07/19/2016, 8:14 PM   I have seen and examined the patient along  with Almyra Deforest, PA-C.  I have reviewed the chart, notes and new data.  I agree with PA's note.  Key new complaints: denies dyspnea, mostly alert, but does get a little drowsy during lulls in conversation (with O2 sat dropping to 84%). When fully alert O2 sat 95%. No angina. Key examination changes: morbid obesity, bilateral edema, hard to see JVP due to obesity, diminished breath sounds in bases  Key new findings / data: CXR and CT chest without overt CHF or clear pneumonia. Mildly elevated WBC. ABG with evidence of chronic respiratory acidosis/metabolic compensation with acute worsening hypoxia and hypercapnia. Mildly elevated BNP (in 300s above baseline of roughly 150) and normal cardiac troponin.  PLAN: Acute on chronic hypoxic and hypercapnic respiratory failure. Obesity-hypoventilation syndrome (Pickwick syndrome). Decompensation possibly related to pneumonia (consider aspiration with recent sedation and TEE cardioversion) and/or heart failure. Maintaining sinus rhythm following electrical cardioversion for atrial flutter. 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   The diagnosis of coronary artery disease and bypass surgery is erroneous. The limited records available from [facility] describe previous resection of an atrial myxoma and repair of PFO. Review of the CT of her chest does not show any obvious bypass grafts. There is also minimal atherosclerosis in the aortic arch and no visible calcification in the distribution of the coronary arteries. The same records mention the fact that she underwent right heart catheterization and coronary angiography on 01/30/2010, prior to thyroid surgery. A notes dated 01/27/2010 rate counts "patent coronary arteries" on that study. The same records from [facility] in 2011 document cardioversion for postoperative atrial flutter and subsequent anticoagulation for atrial flutter with  rapid ventricular response. As far as I can glean from these limited records, the plan was to continue anticoagulation for only 6 weeks, probably estimating that the arrhythmia might have been only a postoperative phenomenon. I have removed the diagnoses of coronary artery disease and bypass surgery from the problem list.  Agree with antibiotics, tailored for a hospital-acquired infection, diuretics, continue treatment with amiodarone to maintain sinus rhythm, continue anticoagulation.  Sanda Klein, MD, [place] [phone] 07/19/2016, 8:53 PM

## 2016-07-19 NOTE — Progress Notes (Signed)
Patient called RN to bedside, when RN arrived to patient room, patient sitting on side of bed, BIPAP mask pulled off and in chair. Patient stated that she did not want to wear the BIPAP mask anymore. RN noted O2 sats to be 70%. O2 via nasal cannula placed on patient and respiratory called. O2 sats up to 89% on respiratory arrival. O2 titrated up to 5L [place], and patient sats in the mid 90's. Patient denied shortness of breath throughout this episode. Will continue to monitor patient.

## 2016-07-19 NOTE — ED Notes (Signed)
EDP aware of low BP. Fluids ordered.

## 2016-07-19 NOTE — Anesthesia Postprocedure Evaluation (Signed)
Anesthesia Post Note  Patient: Tonya Hogan  Procedure(s) Performed: Procedure(s) (LRB): TRANSESOPHAGEAL ECHOCARDIOGRAM (TEE) (N/A) CARDIOVERSION (N/A)  Patient location during evaluation: PACU Anesthesia Type: MAC Level of consciousness: awake and alert Pain management: pain level controlled Vital Signs Assessment: post-procedure vital signs reviewed and stable Respiratory status: spontaneous breathing, nonlabored ventilation, respiratory function stable and patient connected to nasal cannula oxygen Cardiovascular status: stable and blood pressure returned to baseline Anesthetic complications: no    Last Vitals:  Vitals:   07/18/16 1118 07/18/16 1147  BP:  (!) 107/53  Pulse: 70 61  Resp:  18  Temp:  37.1 C    Last Pain:  Vitals:   07/18/16 1147  TempSrc: Oral  PainSc:                  [name]

## 2016-07-19 NOTE — H&P (Signed)
History and Physical  Tonya Hogan [id number] DOB: [birth year] DOA: 07/19/2016  Referring physician: EDP PCP: No PCP Per Patient   Chief Complaint: sob, cough at night  HPI: Tonya Hogan is a 65 y.o. female  Obesity , hypertension, bipolar disorder, COPD on chronic home oxygen at night, CAD status post CABG, systolic CHF, , aflutter s/p cardioversion on 8/25, she was discharged from the hospital on 8/27 after TIA work up and treating aflutter /RVR, she  presents to [facility] with respiratory failure, she is found to be hypoxic in the 80's. cxr with bilateral infiltrate pulmonary edema vs pna, she does has leukocytosis, lactic acidosis, initial on presentation to ED she had brief hypotension required fluids boluses, she was put on bipap, hospitalist called who accepted the patient to be transferred to [facility] cone stepdown unit. She also reported chest tightness, [facility] EDP called cardiology as well. She was given vanc/zosyn and transferred to [place]. Patient currently report feeling better, denies fever, does report cough, more at night, currently chest pain free, able to talk in full sentences on 5liter oxygen, she does report lower extremity edema.   Review of Systems:  Detail per HPI, Review of systems are otherwise negative  Past Medical History:  Diagnosis Date  . Arthritis   . Bipolar 1 disorder ([place])   . CAD (coronary artery disease)   . CHF (congestive heart failure) ([place])   . Depression   . Hypertension   . Hypothyroidism   . Obesity   . S/P CABG x 3   . Thyroid ca ([place])   . Thyroid disease    Past Surgical History:  Procedure Laterality Date  . ABDOMINAL HYSTERECTOMY    . CARDIOVERSION N/A 07/16/2016   Procedure: CARDIOVERSION;  Surgeon: Jerline Pain, MD;  Location: [facility];  Service: Cardiovascular;  Laterality: N/A;  . CORONARY ARTERY BYPASS GRAFT    . TEE WITHOUT CARDIOVERSION N/A 07/16/2016   Procedure: TRANSESOPHAGEAL ECHOCARDIOGRAM (TEE);  Surgeon: Jerline Pain, MD;  Location: [facility] ENDOSCOPY;  Service: Cardiovascular;  Laterality: N/A;  . TONSILLECTOMY     Social History:  reports that she has never smoked. She has never used smokeless tobacco. She reports that she does not drink alcohol or use drugs. Patient lives at home& is able to participate in activities of daily living independently   Allergies  Allergen Reactions  . Methimazole Hives and Rash  . Propylthiouracil Hives  . Chocolate Other (See Comments)    Sneezing, coughing  . Other Other (See Comments)    Corn, cabbage, greens cause Rectal bleeding  . Peanut-Containing Drug Products Other (See Comments)    Sneezing,coughing  . Penicillins Itching, Other (See Comments) and Cough    Sneezing Has patient had a PCN reaction causing immediate rash, facial/tongue/throat swelling, SOB or lightheadedness with hypotension: Yes Has patient had a PCN reaction causing severe rash involving mucus membranes or skin necrosis: Unknown Has patient had a PCN reaction that required hospitalization: Unknown Has patient had a PCN reaction occurring within the last 10 years: No If all of the above answers are "NO", then may proceed with Cephalosporin use.   . Vicodin [Hydrocodone-Acetaminophen] Rash    Family History  Problem Relation Age of Onset  . Hypertension Sister   . Bipolar disorder Other       Prior to Admission medications   Medication Sig Start Date End Date Taking? Authorizing Provider  acetaminophen (RA ACETAMINOPHEN) 650 MG CR tablet Take 650 mg by mouth every 8 (eight)  hours as needed. pain 03/16/13  Yes Historical Provider, MD  albuterol (PROVENTIL HFA;VENTOLIN HFA) 108 (90 BASE) MCG/ACT inhaler Inhale 2 puffs into the lungs every 6 (six) hours as needed for wheezing or shortness of breath. 06/26/15  Yes Donne Hazel, MD  amiodarone (PACERONE) 200 MG tablet Take 1 tablet (200 mg total) by mouth 2 (two) times daily. 07/18/16  Yes Barton Dubois, MD  aspirin EC 81 MG EC tablet Take  1 tablet (81 mg total) by mouth daily. 07/18/16  Yes Barton Dubois, MD  atorvastatin (LIPITOR) 20 MG tablet Take 1 tablet (20 mg total) by mouth at bedtime. 07/18/16  Yes Barton Dubois, MD  bisacodyl (DULCOLAX) 5 MG EC tablet Take 5 mg by mouth 2 (two) times daily as needed for mild constipation.   Yes Historical Provider, MD  divalproex (DEPAKOTE ER) 500 MG 24 hr tablet Take 1,000 mg by mouth 2 (two) times daily.   Yes Historical Provider, MD  ferrous sulfate 325 (65 FE) MG tablet Take 325 mg by mouth daily with breakfast.   Yes Historical Provider, MD  furosemide (LASIX) 40 MG tablet Take 0.5 tablets (20 mg total) by mouth daily. 07/18/16  Yes Barton Dubois, MD  gabapentin (NEURONTIN) 600 MG tablet Take 600 mg by mouth 2 (two) times daily.   Yes Historical Provider, MD  levothyroxine (SYNTHROID, LEVOTHROID) 125 MCG tablet Take 125 mcg by mouth daily before breakfast.   Yes Historical Provider, MD  lisinopril (PRINIVIL,ZESTRIL) 10 MG tablet Take 1 tablet (10 mg total) by mouth daily. 07/18/16  Yes Barton Dubois, MD  metoprolol succinate (TOPROL-XL) 25 MG 24 hr tablet Take 1 tablet (25 mg total) by mouth daily. 07/18/16  Yes Barton Dubois, MD  Omega-3 Fatty Acids (FISH OIL) 1000 MG CAPS Take 1,000 mg by mouth daily.   Yes Historical Provider, MD  OXYGEN Inhale 2 L/min into the lungs as needed.   Yes Historical Provider, MD  polyethylene glycol (MIRALAX / GLYCOLAX) packet Take 17 g by mouth daily as needed for mild constipation.    Yes Historical Provider, MD  potassium chloride SA (K-DUR,KLOR-CON) 20 MEQ tablet Take 1 tablet (20 mEq total) by mouth daily. 10/09/15  Yes Geradine Girt, DO  risperiDONE (RISPERDAL) 2 MG tablet Take 2 mg by mouth at bedtime.   Yes Historical Provider, MD  rivaroxaban (XARELTO) 20 MG TABS tablet Take 1 tablet (20 mg total) by mouth daily with supper. 07/18/16  Yes Barton Dubois, MD  zolpidem (AMBIEN) 5 MG tablet Take 5 mg by mouth at bedtime.   Yes Historical Provider, MD     Physical Exam: BP 118/60 (BP Location: Right Wrist)   Pulse 89   Temp 98.9 F (37.2 C) (Axillary)   Resp 15   Ht 5' (1.524 m)   Wt 128.5 kg (283 lb 4.7 oz)   SpO2 96%   BMI 55.33 kg/m   General:  Obese, NAD, very pleasant Eyes: PERRL ENT: unremarkable Neck: supple, no JVD Cardiovascular: RRR Respiratory: diminished ,but no wheezing, no rales , no rhonchi Abdomen: soft/ND/ND, positive bowel sounds Skin: no rash Musculoskeletal:  +bilateral lower extremity edema, tight, more on the left leg Psychiatric: calm/cooperative Neurologic: no focal findings            Labs on Admission:  Basic Metabolic Panel:  Recent Labs Lab 07/13/16 1335 07/13/16 2206 07/14/16 0607 07/17/16 0959 07/18/16 0313 07/19/16 1240  NA 140  --  139 139 140 139  K 4.3  --  3.9  4.1 4.4 4.0  CL 99*  --  96* 100* 100* 101  CO2 34*  --  37* 33* 34* 30  GLUCOSE 134*  --  100* 152* 81 109*  BUN 32*  --  19 11 20  22*  CREATININE 0.94  --  0.88 0.92 1.03* 0.87  CALCIUM 8.8*  --  8.6* 8.6* 8.8* 8.5*  MG 1.8 1.9  --   --   --   --    Liver Function Tests:  Recent Labs Lab 07/13/16 1335 07/14/16 0607 07/19/16 1240  AST 11* 11* 14*  ALT 10* 9* 10*  ALKPHOS 49 51 53  BILITOT 0.2* 0.3 0.1*  PROT 7.7 7.0 7.5  ALBUMIN 2.9* 2.7* 2.9*   No results for input(s): LIPASE, AMYLASE in the last 168 hours. No results for input(s): AMMONIA in the last 168 hours. CBC:  Recent Labs Lab 07/13/16 1335 07/14/16 0607 07/17/16 0959 07/18/16 0313 07/19/16 1240  WBC 6.6 5.9 6.6 7.4 13.5*  NEUTROABS 3.9  --   --   --  11.0*  HGB 9.1* 8.8* 8.8* 8.4* 8.4*  HCT 30.8* 30.6* 30.5* 29.7* 28.9*  MCV 85.6 86.2 85.4 86.1 85.3  PLT 298 280 236 253 262   Cardiac Enzymes:  Recent Labs Lab 07/13/16 1335 07/13/16 2206 07/14/16 0324 07/14/16 0607 07/19/16 1250  TROPONINI <0.03 <0.03 <0.03 <0.03 <0.03    BNP (last 3 results)  Recent Labs  10/21/15 1235 07/13/16 1335 07/19/16 1250  BNP 152.1* 328.4*  348.4*    ProBNP (last 3 results) No results for input(s): PROBNP in the last 8760 hours.  CBG: No results for input(s): GLUCAP in the last 168 hours.  Radiological Exams on Admission: Dg Chest Port 1 View  Result Date: 07/19/2016 CLINICAL DATA:  Shortness of breath. EXAM: PORTABLE CHEST 1 VIEW COMPARISON:  07/13/2016 FINDINGS: Prior CABG. Mild cardiomegaly with vascular congestion. Low lung volumes. Linear areas of scarring or atelectasis in the lungs bilaterally. No effusions. No acute bony abnormality. IMPRESSION: Areas of mid lung atelectasis or scarring. Cardiomegaly, vascular congestion. Low lung volumes. Electronically Signed   By: Rolm Baptise M.D.   On: 07/19/2016 13:19    EKG: Independently reviewed. Sinus rhythm, no acute ST/T changes, QTC 467  Assessment/Plan Present on Admission: . Respiratory failure (HCC)   Acute on chronic hypoxic respiratory failure: initially required bipap. Better. Could be from chf exacerbation and HCAP, she does has leukocytosis, lactic acidosis, currently she does not look septic, but will continue vanc/zosyn for now, follow lactic acid/procalcitonin,  Low threshhold to narrow abx if patient continue to improve.  Sputum culture, mrsa screening, will give iv lasix 40mg  iv x1 now.  Acute on chronic systolic chf exacerbation: bnp elevated from basline. Troponin negative, ekg no acute changes, continue betablocker, hold acei for now due to tendency of hypotension, start iv lasix. Cardiology consulted.  H/o Aflutter/TIA, currently sinus rhythm, continue betablocker/amiodarone/xarelto/asa. Cardiology following  CAD s/p cabg: continue betablocker, statin, asa. Currently chest pain free, troponin negative, ekg no acute changes.  HTN: continue toprol-xl, lasix, hold lisinopril due to borderline low bp, restart when bp improves.   Morbid Obesity: Body mass index is 55.33 kg/m. life style modification.  Psych: stable , continue home meds  depakote/risperdal   DVT prophylaxis: on xarelto  Consultants:  cardiology  Code Status: full   Family Communication:  Patient   Disposition Plan: admit to stepdown  Time spent: 65mins  Serenity Batley MD, PhD [facility] Pager [phone] If 7PM-7AM, please contact  night-coverage at [email or web address], password [facility]

## 2016-07-19 NOTE — Progress Notes (Addendum)
Pharmacy Antibiotic Note  Tonya Hogan is a 65 y.o. female admitted on 07/19/2016 with sepsis.  Pharmacy has been consulted for Vancomycin and Zosyn dosing.  She was discharged from [facility] yesterday (8/27) after an admission for TIA/bradycardia. At [facility] today with confusion, CP and SOB. WBC elevated and she is hypertensive, tmax at [facility] 99.8.  Plan: -Vancomycin 2500mg  IV x1 as loading dose, then 1g  IV every 12 hours.  Goal trough 15-20 mcg/mL. -Zosyn 3.375g IV q8h (4 hour infusion).  Height: 5' (152.4 cm) Weight: 276 lb (125.2 kg) IBW/kg (Calculated) : 45.5  Temp (24hrs), Avg:99.8 F (37.7 C), Min:99.8 F (37.7 C), Max:99.8 F (37.7 C)   Recent Labs Lab 07/13/16 1335 07/13/16 1733 07/14/16 0607 07/17/16 0959 07/18/16 0313 07/19/16 1240 07/19/16 1252  WBC 6.6  --  5.9 6.6 7.4 13.5*  --   CREATININE 0.94  --  0.88 0.92 1.03* 0.87  --   LATICACIDVEN  --  1.28  --   --   --   --  2.29*    Estimated Creatinine Clearance: 79.8 mL/min (by C-G formula based on SCr of 0.87 mg/dL).    Allergies  Allergen Reactions  . Methimazole Hives and Rash  . Propylthiouracil Hives  . Chocolate     Sneezing, coughing  . Other Other (See Comments)    Corn, cabbage, greens = Rectal bleeding  . Peanut-Containing Drug Products     Sneezing,coughing  . Penicillins Itching and Cough    Sneezing Has patient had a PCN reaction causing immediate rash, facial/tongue/throat swelling, SOB or lightheadedness with hypotension: Yes Has patient had a PCN reaction causing severe rash involving mucus membranes or skin necrosis: Unknown Has patient had a PCN reaction that required hospitalization: Unknown Has patient had a PCN reaction occurring within the last 10 years: No If all of the above answers are "NO", then may proceed with Cephalosporin use.   . Vicodin [Hydrocodone-Acetaminophen] Rash    Antimicrobials this admission: Vanc 8/28 >>  Zosyn 8/28 >>   Dose adjustments this  admission: n/a  Microbiology results: 8/28 BCx: sent 8/28 UCx:     Thank you for allowing pharmacy to be a part of this patient's care.  Jacobus Colvin D. Ayse Mccartin, PharmD, BCPS Clinical Pharmacist Pager: [phone] 07/19/2016 2:11 PM

## 2016-07-19 NOTE — ED Notes (Signed)
O2 at 6 liters/min placed on pt by RT Nasal cannnula. Sats are in upper 80's. Pt daughter states pt had a TIA about 1 week ago and just got out of hospital yesterday.

## 2016-07-19 NOTE — ED Notes (Signed)
[facility] ---  transporting patient to [facility]-- room 2C11

## 2016-07-20 DIAGNOSIS — R0902 Hypoxemia: Secondary | ICD-10-CM

## 2016-07-20 DIAGNOSIS — G458 Other transient cerebral ischemic attacks and related syndromes: Secondary | ICD-10-CM

## 2016-07-20 DIAGNOSIS — J9600 Acute respiratory failure, unspecified whether with hypoxia or hypercapnia: Secondary | ICD-10-CM

## 2016-07-20 DIAGNOSIS — I519 Heart disease, unspecified: Secondary | ICD-10-CM

## 2016-07-20 DIAGNOSIS — I10 Essential (primary) hypertension: Secondary | ICD-10-CM

## 2016-07-20 DIAGNOSIS — I2510 Atherosclerotic heart disease of native coronary artery without angina pectoris: Secondary | ICD-10-CM

## 2016-07-20 LAB — CBC
HEMATOCRIT: 28.3 % — AB (ref 36.0–46.0)
Hemoglobin: 7.8 g/dL — ABNORMAL LOW (ref 12.0–15.0)
MCH: 23.9 pg — AB (ref 26.0–34.0)
MCHC: 27.6 g/dL — ABNORMAL LOW (ref 30.0–36.0)
MCV: 86.8 fL (ref 78.0–100.0)
PLATELETS: 238 10*3/uL (ref 150–400)
RBC: 3.26 MIL/uL — ABNORMAL LOW (ref 3.87–5.11)
RDW: 17.2 % — AB (ref 11.5–15.5)
WBC: 10.7 10*3/uL — AB (ref 4.0–10.5)

## 2016-07-20 LAB — CBC AND DIFFERENTIAL
HEMATOCRIT: 28 % — AB (ref 36–46)
HEMOGLOBIN: 7.8 g/dL — AB (ref 12.0–16.0)
Platelets: 238 10*3/uL (ref 150–399)
WBC: 10.7 10^3/mL

## 2016-07-20 LAB — BASIC METABOLIC PANEL
Anion gap: 4 — ABNORMAL LOW (ref 5–15)
BUN: 13 mg/dL (ref 4–21)
BUN: 13 mg/dL (ref 6–20)
CALCIUM: 8.3 mg/dL — AB (ref 8.9–10.3)
CO2: 38 mmol/L — AB (ref 22–32)
CREATININE: 0.86 mg/dL (ref 0.44–1.00)
Chloride: 100 mmol/L — ABNORMAL LOW (ref 101–111)
Creatinine: 0.9 mg/dL (ref 0.5–1.1)
GFR calc Af Amer: >60 mL/min (ref >60)
GLUCOSE: 102 mg/dL
GLUCOSE: 102 mg/dL — AB (ref 65–99)
POTASSIUM: 3.8 mmol/L (ref 3.4–5.3)
Potassium: 3.8 mmol/L (ref 3.5–5.1)
SODIUM: 142 mmol/L (ref 137–147)
Sodium: 142 mmol/L (ref 135–145)

## 2016-07-20 LAB — LACTIC ACID, PLASMA: Lactic Acid, Venous: 0.6 mmol/L (ref 0.5–1.9)

## 2016-07-20 LAB — MAGNESIUM: Magnesium: 2 mg/dL (ref 1.7–2.4)

## 2016-07-20 MED ORDER — FERROUS SULFATE 325 (65 FE) MG PO TABS
325.0000 mg | ORAL_TABLET | Freq: Every day | ORAL | Status: DC
  Administered 2016-07-20 – 2016-07-24 (×5): 325 mg via ORAL
  Filled 2016-07-20 (×5): qty 1

## 2016-07-20 MED ORDER — FUROSEMIDE 10 MG/ML IJ SOLN
60.0000 mg | Freq: Three times a day (TID) | INTRAMUSCULAR | Status: DC
  Administered 2016-07-20 – 2016-07-21 (×4): 60 mg via INTRAVENOUS
  Filled 2016-07-20 (×4): qty 6

## 2016-07-20 MED ORDER — FUROSEMIDE 10 MG/ML IJ SOLN: 60.0000 mg | Freq: Two times a day (BID) | INTRAMUSCULAR | Status: DC

## 2016-07-20 MED ORDER — LEVOTHYROXINE SODIUM 25 MCG PO TABS
125.0000 ug | ORAL_TABLET | Freq: Every day | ORAL | Status: DC
  Administered 2016-07-21 – 2016-07-24 (×4): 125 ug via ORAL
  Filled 2016-07-20 (×4): qty 1

## 2016-07-20 NOTE — H&P (Signed)
Patient to transfer to 3W-15. Report called to [place], Therapist, sports.

## 2016-07-20 NOTE — Plan of Care (Signed)
Problem: Education: Goal: Knowledge of [facility] Education information/materials will improve Outcome: Progressing Discussed with patient plan of care for the evening including need for high flow oxygen and bipap at bedtime with some teach back displayed

## 2016-07-20 NOTE — Progress Notes (Signed)
PROGRESS NOTE    Tonya Hogan  [id number] DOB: [birth year] DOA: 07/19/2016 PCP: No PCP Per Patient   Brief Narrative:   65 y.o. BF PMHx  Bipolar DO, TIA, Obesity , HTN hypertension,, COPD on chronic home oxygen at night, CAD native artery status post CABG, Chronic Systolic CHF, Atrial Flutter S/P cardioversion on 8/25, Hypothyroidism  she was discharged from the hospital on 8/27 after TIA work up and treating aflutter /RVR, she  presents to [facility] with respiratory failure, she is found to be hypoxic in the 80's. cxr with bilateral infiltrate pulmonary edema vs pna, she does has leukocytosis, lactic acidosis, initial on presentation to ED she had brief hypotension required fluids boluses, she was put on bipap, hospitalist called who accepted the patient to be transferred to [facility] cone stepdown unit. She also reported chest tightness, [facility] EDP called cardiology as well. She was given vanc/zosyn and transferred to [place]. Patient currently report feeling better, denies fever, does report cough, more at night, currently chest pain free, able to talk in full sentences on 5 liter oxygen, she does report lower extremity edema.   Subjective: 8/29 A/O 4, states on 2 L O2 at home. Does not ambulate much. Believes her dry weight is 125 kg   Assessment & Plan:   Active Problems:   Respiratory failure (HCC)   Hypoxia    Acute on chronic hypoxic respiratory failure: - initially required BiPAP. Better. -Most likely secondary to CHF. PCXR and patient's symptoms like consistent with infection. Stop antibiotics  Sputum culture, mrsa screening, will give iv lasix 40mg  iv x1 now.  Acute on Chronic Systolic CHF:  bnp elevated from basline. Troponin negative, ekg no acute changes, continue betablocker, hold acei for now due to tendency of hypotension, start iv lasix. Cardiology consulted. -Strict I&O since admission -4.8 L -Daily weight Filed Weights   07/19/16 1237 07/19/16 1824  Weight: 125.2  kg (276 lb) 128.5 kg (283 lb 4.7 oz)  -Lasix IV 60 mg TID -Discontinue metoprolol for now. Patient needs aggressive diuresis and BP/HR low -Continue amiodarone 200 mg BID -Patient's states base weight 125 kg   A-flutter/TIA,  -currently sinus rhythm, continue betablocker/amiodarone/xarelto/asa. Cardiology following  CAD native artery s/p cabg:  -continue betablocker, statin, asa. Currently chest pain free, troponin negative, ekg no acute changes.  HTN:  -See CHF  Morbid Obesity:  -Body mass index is 55.33 kg/m. life style modification.  Psych:  -stable , continue home meds depakote/risperdal    DVT prophylaxis: Xarelto Code Status: Full Family Communication: None Disposition Plan: Resolution CHF exacerbation   Consultants:  Dr.Jonathan J Gwenlyn [facility]    Procedures/Significant Events:  None   Cultures 8/22 MRSA by PCR positive 8/28 blood left forearm/right arm NGTD 8/28 urine pending 8/28 MRSA by PCR negative   Antimicrobials: Zosyn 8/28>> 8/29 Vancomycin 8/28>> 8/29   Devices    LINES / TUBES:      Continuous Infusions:    Objective: Vitals:   07/20/16 0500 07/20/16 0600 07/20/16 0900 07/20/16 1000  BP: (!) 89/60 104/65 (!) 151/123   Pulse: (!) 59 (!) 58    Resp: 16 17 20 16   Temp:   (!) 96.4 F (35.8 C)   TempSrc:   Axillary   SpO2: 100% 100% 100% 100%  Weight:      Height:        Intake/Output Summary (Last 24 hours) at 07/20/16 1139 Last data filed at 07/20/16 0600  Gross per 24 hour  Intake  540 ml  Output             5351 ml  Net            -4811 ml   Filed Weights   07/19/16 1237 07/19/16 1824  Weight: 125.2 kg (276 lb) 128.5 kg (283 lb 4.7 oz)    Examination:  General: A/O 4, positive acute on chronic respiratory distress Eyes: negative scleral hemorrhage, negative anisocoria, negative icterus ENT: Negative Runny nose, negative gingival bleeding, Neck:  Negative scars, masses, torticollis,  lymphadenopathy, JVD Lungs: secondary to morbid obesity extremely difficult to auscultate, but  appeared clear to auscultation bilateral, negative wheezing, negative crackles Cardiovascular: Regular rate and rhythm without murmur gallop or rub normal S1 and S2 Abdomen: MORBID OBESITY, negative abdominal pain, nondistended, positive soft, bowel sounds, no rebound, no ascites, no appreciable mass Extremities: No significant cyanosis, clubbing, or edema bilateral lower extremities Skin: Negative rashes, lesions, ulcers Psychiatric:  Negative depression, negative anxiety, negative fatigue, negative mania  Central nervous system:  Cranial nerves II through XII intact, tongue/uvula midline, all extremities muscle strength 5/5, sensation intact throughout, finger nose finger bilateral within normal limits, quick finger touch bilateral within normal limits,  negative dysarthria, negative expressive aphasia, negative receptive aphasia.  .     Data Reviewed: Care during the described time interval was provided by me .  I have reviewed this patient's available data, including medical history, events of note, physical examination, and all test results as part of my evaluation. I have personally reviewed and interpreted all radiology studies.  CBC:  Recent Labs Lab 07/13/16 1335 07/14/16 0607 07/17/16 0959 07/18/16 0313 07/19/16 1240 07/20/16 0347  WBC 6.6 5.9 6.6 7.4 13.5* 10.7*  NEUTROABS 3.9  --   --   --  11.0*  --   HGB 9.1* 8.8* 8.8* 8.4* 8.4* 7.8*  HCT 30.8* 30.6* 30.5* 29.7* 28.9* 28.3*  MCV 85.6 86.2 85.4 86.1 85.3 86.8  PLT 298 280 236 253 262 [id number]   Basic Metabolic Panel:  Recent Labs Lab 07/13/16 1335 07/13/16 2206 07/14/16 0607 07/17/16 0959 07/18/16 0313 07/19/16 1240 07/20/16 0347  NA 140  --  139 139 140 139 142  K 4.3  --  3.9 4.1 4.4 4.0 3.8  CL 99*  --  96* 100* 100* 101 100*  CO2 34*  --  37* 33* 34* 30 38*  GLUCOSE 134*  --  100* 152* 81 109* 102*  BUN 32*  --   19 11 20  22* 13  CREATININE 0.94  --  0.88 0.92 1.03* 0.87 0.86  CALCIUM 8.8*  --  8.6* 8.6* 8.8* 8.5* 8.3*  MG 1.8 1.9  --   --   --   --  2.0   GFR: Estimated Creatinine Clearance: 82.1 mL/min (by C-G formula based on SCr of 0.86 mg/dL). Liver Function Tests:  Recent Labs Lab 07/13/16 1335 07/14/16 0607 07/19/16 1240  AST 11* 11* 14*  ALT 10* 9* 10*  ALKPHOS 49 51 53  BILITOT 0.2* 0.3 0.1*  PROT 7.7 7.0 7.5  ALBUMIN 2.9* 2.7* 2.9*   No results for input(s): LIPASE, AMYLASE in the last 168 hours. No results for input(s): AMMONIA in the last 168 hours. Coagulation Profile:  Recent Labs Lab 07/19/16 1240  INR 1.10   Cardiac Enzymes:  Recent Labs Lab 07/13/16 1335 07/13/16 2206 07/14/16 0324 07/14/16 0607 07/19/16 1250  TROPONINI <0.03 <0.03 <0.03 <0.03 <0.03   BNP (last 3 results) No results for input(s): PROBNP  in the last 8760 hours. HbA1C: No results for input(s): HGBA1C in the last 72 hours. CBG: No results for input(s): GLUCAP in the last 168 hours. Lipid Profile: No results for input(s): CHOL, HDL, LDLCALC, TRIG, CHOLHDL, LDLDIRECT in the last 72 hours. Thyroid Function Tests: No results for input(s): TSH, T4TOTAL, FREET4, T3FREE, THYROIDAB in the last 72 hours. Anemia Panel: No results for input(s): VITAMINB12, FOLATE, FERRITIN, TIBC, IRON, RETICCTPCT in the last 72 hours. Urine analysis:    Component Value Date/Time   COLORURINE YELLOW 07/19/2016 1350   APPEARANCEUR CLEAR 07/19/2016 1350   LABSPEC 1.022 07/19/2016 1350   PHURINE 6.0 07/19/2016 1350   GLUCOSEU NEGATIVE 07/19/2016 1350   HGBUR NEGATIVE 07/19/2016 1350   BILIRUBINUR NEGATIVE 07/19/2016 1350   KETONESUR NEGATIVE 07/19/2016 1350   PROTEINUR NEGATIVE 07/19/2016 1350   UROBILINOGEN 1.0 10/06/2015 2010   NITRITE NEGATIVE 07/19/2016 1350   LEUKOCYTESUR NEGATIVE 07/19/2016 1350   Sepsis Labs: @LABRCNTIP (procalcitonin:4,lacticidven:4)  ) Recent Results (from the past 240 hour(s))    MRSA PCR Screening     Status: Abnormal   Collection Time: 07/13/16  6:46 PM  Result Value Ref Range Status   MRSA by PCR POSITIVE (A) NEGATIVE Final    Comment:        The GeneXpert MRSA Assay (FDA approved for NASAL specimens only), is one component of a comprehensive MRSA colonization surveillance program. It is not intended to diagnose MRSA infection nor to guide or monitor treatment for MRSA infections. RESULT CALLED TO, READ BACK BY AND VERIFIED WITH: T IRBY RN [id number] 07/13/16 A BROWNING   Blood Culture (routine x 2)     Status: None (Preliminary result)   Collection Time: 07/19/16  1:10 PM  Result Value Ref Range Status   Specimen Description BLOOD LEFT FOREARM  Final   Special Requests BOTTLES DRAWN AEROBIC AND ANAEROBIC 5CC EACH  Final   Culture   Final    NO GROWTH < 24 HOURS Performed at [facility]    Report Status PENDING  Incomplete  Blood Culture (routine x 2)     Status: None (Preliminary result)   Collection Time: 07/19/16  1:40 PM  Result Value Ref Range Status   Specimen Description BLOOD RIGHT ARM  Final   Special Requests BOTTLES DRAWN AEROBIC AND ANAEROBIC 5CC EACH  Final   Culture   Final    NO GROWTH < 24 HOURS Performed at [facility]    Report Status PENDING  Incomplete  MRSA PCR Screening     Status: None   Collection Time: 07/19/16  6:23 PM  Result Value Ref Range Status   MRSA by PCR NEGATIVE NEGATIVE Final    Comment:        The GeneXpert MRSA Assay (FDA approved for NASAL specimens only), is one component of a comprehensive MRSA colonization surveillance program. It is not intended to diagnose MRSA infection nor to guide or monitor treatment for MRSA infections.          Radiology Studies: Dg Chest Port 1 View  Result Date: 07/19/2016 CLINICAL DATA:  Shortness of breath. EXAM: PORTABLE CHEST 1 VIEW COMPARISON:  07/13/2016 FINDINGS: Prior CABG. Mild cardiomegaly with vascular congestion. Low lung volumes. Linear  areas of scarring or atelectasis in the lungs bilaterally. No effusions. No acute bony abnormality. IMPRESSION: Areas of mid lung atelectasis or scarring. Cardiomegaly, vascular congestion. Low lung volumes. Electronically Signed   By: Rolm Baptise M.D.   On: 07/19/2016 13:19  Scheduled Meds: . amiodarone  200 mg Oral BID  . aspirin  81 mg Oral Daily  . atorvastatin  20 mg Oral QHS  . divalproex  1,000 mg Oral BID  . ferrous sulfate  325 mg Oral Q breakfast  . furosemide  60 mg Intravenous TID  . gabapentin  600 mg Oral BID  . levothyroxine  125 mcg Oral QAC breakfast  . risperiDONE  2 mg Oral QHS  . rivaroxaban  20 mg Oral Q supper   Continuous Infusions:    LOS: 1 day    Time spent: 40 minutes    Judah Chevere, Geraldo Docker, MD [facility] Pager [phone]   If 7PM-7AM, please contact night-coverage [email or web address] Password [facility] 07/20/2016, 11:39 AM

## 2016-07-20 NOTE — Progress Notes (Signed)
Subjective:  Feeling much better on IV ATBX and diuretics. Maintaining NSR after recent TEE DCCV  Objective:  Temp:  [98 F (36.7 C)-99.8 F (37.7 C)] 98 F (36.7 C) (08/29 0341) Pulse Rate:  [57-94] 58 (08/29 0600) Resp:  [15-28] 17 (08/29 0600) BP: (89-154)/(37-101) 104/65 (08/29 0600) SpO2:  [75 %-100 %] 100 % (08/29 0600) Weight:  [276 lb (125.2 kg)-283 lb 4.7 oz (128.5 kg)] 283 lb 4.7 oz (128.5 kg) (08/28 1824) Weight change:   Intake/Output from previous day: 08/28 0701 - 08/29 0700 In: 540 [P.O.:240; IV Piggyback:300] Out: 5351 [Urine:5351]  Intake/Output from this shift: No intake/output data recorded.  Physical Exam: General appearance: alert and no distress Neck: no adenopathy, no carotid bruit, no JVD, supple, symmetrical, trachea midline and thyroid not enlarged, symmetric, no tenderness/mass/nodules Lungs: clear to auscultation bilaterally Heart: regular rate and rhythm, S1, S2 normal, no murmur, click, rub or gallop Extremities: extremities normal, atraumatic, no cyanosis or edema  Lab Results: Results for orders placed or performed during the hospital encounter of 07/19/16 (from the past 48 hour(s))  Comprehensive metabolic panel     Status: Abnormal   Collection Time: 07/19/16 12:40 PM  Result Value Ref Range   Sodium 139 135 - 145 mmol/L   Potassium 4.0 3.5 - 5.1 mmol/L   Chloride 101 101 - 111 mmol/L   CO2 30 22 - 32 mmol/L   Glucose, Bld 109 (H) 65 - 99 mg/dL   BUN 22 (H) 6 - 20 mg/dL   Creatinine, Ser 0.87 0.44 - 1.00 mg/dL   Calcium 8.5 (L) 8.9 - 10.3 mg/dL   Total Protein 7.5 6.5 - 8.1 g/dL   Albumin 2.9 (L) 3.5 - 5.0 g/dL   AST 14 (L) 15 - 41 U/L   ALT 10 (L) 14 - 54 U/L   Alkaline Phosphatase 53 38 - 126 U/L   Total Bilirubin 0.1 (L) 0.3 - 1.2 mg/dL   GFR calc non Af Amer >60 >60 mL/min   GFR calc Af Amer >60 >60 mL/min    Comment: (NOTE) The eGFR has been calculated using the CKD EPI equation. This calculation has not been  validated in all clinical situations. eGFR's persistently <60 mL/min signify possible Chronic Kidney Disease.    Anion gap 8 5 - 15  CBC WITH DIFFERENTIAL     Status: Abnormal   Collection Time: 07/19/16 12:40 PM  Result Value Ref Range   WBC 13.5 (H) 4.0 - 10.5 K/uL   RBC 3.39 (L) 3.87 - 5.11 MIL/uL   Hemoglobin 8.4 (L) 12.0 - 15.0 g/dL   HCT 28.9 (L) 36.0 - 46.0 %   MCV 85.3 78.0 - 100.0 fL   MCH 24.8 (L) 26.0 - 34.0 pg   MCHC 29.1 (L) 30.0 - 36.0 g/dL   RDW 17.2 (H) 11.5 - 15.5 %   Platelets 262 150 - 400 K/uL   Neutrophils Relative % 82 %   Lymphocytes Relative 5 %   Monocytes Relative 13 %   Eosinophils Relative 0 %   Basophils Relative 0 %   Band Neutrophils 0 %   Metamyelocytes Relative 0 %   Myelocytes 0 %   Promyelocytes Absolute 0 %   Blasts 0 %   nRBC 0 0 /100 WBC   Neutro Abs 11.0 (H) 1.7 - 7.7 K/uL   Lymphs Abs 0.7 0.7 - 4.0 K/uL   Monocytes Absolute 1.8 (H) 0.1 - 1.0 K/uL   Eosinophils Absolute 0.0 0.0 -  0.7 K/uL   Basophils Absolute 0.0 0.0 - 0.1 K/uL   Smear Review MORPHOLOGY UNREMARKABLE   Protime-INR     Status: None   Collection Time: 07/19/16 12:40 PM  Result Value Ref Range   Prothrombin Time 14.2 11.4 - 15.2 seconds   INR 1.10   Troponin I     Status: None   Collection Time: 07/19/16 12:50 PM  Result Value Ref Range   Troponin I <0.03 <0.03 ng/mL  Brain natriuretic peptide     Status: Abnormal   Collection Time: 07/19/16 12:50 PM  Result Value Ref Range   B Natriuretic Peptide 348.4 (H) 0.0 - 100.0 pg/mL  I-Stat CG4 Lactic Acid, ED  (not at  [facility])     Status: Abnormal   Collection Time: 07/19/16 12:52 PM  Result Value Ref Range   Lactic Acid, Venous 2.29 (HH) 0.5 - 1.9 mmol/L   Comment NOTIFIED PHYSICIAN   I-Stat arterial blood gas, ED ([facility], [facility])     Status: Abnormal   Collection Time: 07/19/16  1:11 PM  Result Value Ref Range   pH, Arterial 7.383 7.350 - 7.450   pCO2 arterial 59.6 (HH) 35.0 - 45.0 mmHg   pO2, Arterial 57.0 (L) 80.0 -  100.0 mmHg   Bicarbonate 35.5 (H) 20.0 - 24.0 mEq/L   TCO2 37 0 - 100 mmol/L   O2 Saturation 88.0 %   Acid-Base Excess 9.0 (H) 0.0 - 2.0 mmol/L   Patient temperature HIDE    Sample type ARTERIAL    Comment NOTIFIED PHYSICIAN   Blood Culture (routine x 2)     Status: None (Preliminary result)   Collection Time: 07/19/16  1:40 PM  Result Value Ref Range   Specimen Description      BLOOD RIGHT ARM Performed at [facility]    Special Requests BOTTLES DRAWN AEROBIC AND ANAEROBIC 5CC EACH    Culture PENDING    Report Status PENDING   Urinalysis, Routine w reflex microscopic (not at [facility])     Status: None   Collection Time: 07/19/16  1:50 PM  Result Value Ref Range   Color, Urine YELLOW YELLOW   APPearance CLEAR CLEAR   Specific Gravity, Urine 1.022 1.005 - 1.030   pH 6.0 5.0 - 8.0   Glucose, UA NEGATIVE NEGATIVE mg/dL   Hgb urine dipstick NEGATIVE NEGATIVE   Bilirubin Urine NEGATIVE NEGATIVE   Ketones, ur NEGATIVE NEGATIVE mg/dL   Protein, ur NEGATIVE NEGATIVE mg/dL   Nitrite NEGATIVE NEGATIVE   Leukocytes, UA NEGATIVE NEGATIVE    Comment: MICROSCOPIC NOT DONE ON URINES WITH NEGATIVE PROTEIN, BLOOD, LEUKOCYTES, NITRITE, OR GLUCOSE <1000 mg/dL.  I-Stat arterial blood gas, ED     Status: Abnormal   Collection Time: 07/19/16  4:07 PM  Result Value Ref Range   pH, Arterial 7.444 7.350 - 7.450   pCO2 arterial 55.8 (H) 35.0 - 45.0 mmHg   pO2, Arterial 36.0 (LL) 80.0 - 100.0 mmHg   Bicarbonate 38.3 (H) 20.0 - 24.0 mEq/L   TCO2 40 0 - 100 mmol/L   O2 Saturation 69.0 %   Acid-Base Excess 13.0 (H) 0.0 - 2.0 mmol/L   Patient temperature HIDE    Sample type ARTERIAL    Comment NOTIFIED PHYSICIAN   MRSA PCR Screening     Status: None   Collection Time: 07/19/16  6:23 PM  Result Value Ref Range   MRSA by PCR NEGATIVE NEGATIVE    Comment:        The GeneXpert  MRSA Assay (FDA approved for NASAL specimens only), is one component of a comprehensive MRSA  colonization surveillance program. It is not intended to diagnose MRSA infection nor to guide or monitor treatment for MRSA infections.   Procalcitonin - Baseline     Status: None   Collection Time: 07/19/16  7:31 PM  Result Value Ref Range   Procalcitonin <0.10 ng/mL    Comment:        Interpretation: PCT (Procalcitonin) <= 0.5 ng/mL: Systemic infection (sepsis) is not likely. Local bacterial infection is possible. (NOTE)         ICU PCT Algorithm               Non ICU PCT Algorithm    ----------------------------     ------------------------------         PCT < 0.25 ng/mL                 PCT < 0.1 ng/mL     Stopping of antibiotics            Stopping of antibiotics       strongly encouraged.               strongly encouraged.    ----------------------------     ------------------------------       PCT level decrease by               PCT < 0.25 ng/mL       >= 80% from peak PCT       OR PCT 0.25 - 0.5 ng/mL          Stopping of antibiotics                                             encouraged.     Stopping of antibiotics           encouraged.    ----------------------------     ------------------------------       PCT level decrease by              PCT >= 0.25 ng/mL       < 80% from peak PCT        AND PCT >= 0.5 ng/mL            Continuin g antibiotics                                              encouraged.       Continuing antibiotics            encouraged.    ----------------------------     ------------------------------     PCT level increase compared          PCT > 0.5 ng/mL         with peak PCT AND          PCT >= 0.5 ng/mL             Escalation of antibiotics                                          strongly encouraged.  Escalation of antibiotics        strongly encouraged.   CBC     Status: Abnormal   Collection Time: 07/20/16  3:47 AM  Result Value Ref Range   WBC 10.7 (H) 4.0 - 10.5 K/uL   RBC 3.26 (L) 3.87 - 5.11 MIL/uL   Hemoglobin 7.8 (L) 12.0 -  15.0 g/dL   HCT 28.3 (L) 36.0 - 46.0 %   MCV 86.8 78.0 - 100.0 fL   MCH 23.9 (L) 26.0 - 34.0 pg   MCHC 27.6 (L) 30.0 - 36.0 g/dL   RDW 17.2 (H) 11.5 - 15.5 %   Platelets 238 150 - 400 K/uL  Basic metabolic panel     Status: Abnormal   Collection Time: 07/20/16  3:47 AM  Result Value Ref Range   Sodium 142 135 - 145 mmol/L   Potassium 3.8 3.5 - 5.1 mmol/L   Chloride 100 (L) 101 - 111 mmol/L   CO2 38 (H) 22 - 32 mmol/L   Glucose, Bld 102 (H) 65 - 99 mg/dL   BUN 13 6 - 20 mg/dL   Creatinine, Ser 0.86 0.44 - 1.00 mg/dL   Calcium 8.3 (L) 8.9 - 10.3 mg/dL   GFR calc non Af Amer >60 >60 mL/min   GFR calc Af Amer >60 >60 mL/min    Comment: (NOTE) The eGFR has been calculated using the CKD EPI equation. This calculation has not been validated in all clinical situations. eGFR's persistently <60 mL/min signify possible Chronic Kidney Disease.    Anion gap 4 (L) 5 - 15  Magnesium     Status: None   Collection Time: 07/20/16  3:47 AM  Result Value Ref Range   Magnesium 2.0 1.7 - 2.4 mg/dL  Lactic acid, plasma     Status: None   Collection Time: 07/20/16  3:47 AM  Result Value Ref Range   Lactic Acid, Venous 0.6 0.5 - 1.9 mmol/L    Imaging: Imaging results have been reviewed   Tele- NSR in 70s   Assessment/Plan:   1. Active Problems: 2.   Respiratory failure ([place]) 3.   Hypoxia 4. LV dysfunction 5. Systolic CHF 6. HAP 7. H/O PA Flutter 8. Obesity Hypoventilation   Time Spent Directly with Patient:  20 minutes  Length of Stay:  LOS: 1 day   Pt admitted yesterday after being D/C from the hosp the prior day for chest pain and respiratory distress. Her WHC was mildly elevated as was Lactic acid with low grade fever and productive cough. BNP was mildly elevated. She came off BiPAP and supplemental O2. She has received IV ATBX and lasix (I/O neg 4.8 liters) . Her periph edema has resolved and lungs are clear. She remains in NSR on Amio and Xarelto. Continue current meds. Can  prob transition to PO lasix tomorrow. Will follow with you. Will need to recheck 2D in 3 months. I wonder whether LV dysfunction was related to PA Flutter.  Quay Burow 07/20/2016, 9:35 AM

## 2016-07-21 DIAGNOSIS — I483 Typical atrial flutter: Secondary | ICD-10-CM

## 2016-07-21 LAB — BASIC METABOLIC PANEL
Anion gap: 10 (ref 5–15)
BUN: 13 mg/dL (ref 4–21)
BUN: 13 mg/dL (ref 6–20)
CALCIUM: 8.5 mg/dL — AB (ref 8.9–10.3)
CO2: 38 mmol/L — ABNORMAL HIGH (ref 22–32)
CREATININE: 0.89 mg/dL (ref 0.44–1.00)
CREATININE: 0.9 mg/dL (ref 0.5–1.1)
Chloride: 92 mmol/L — ABNORMAL LOW (ref 101–111)
GFR calc Af Amer: >60 mL/min (ref >60)
Glucose, Bld: 170 mg/dL — ABNORMAL HIGH (ref 65–99)
Glucose: 170 mg/dL
POTASSIUM: 3.7 mmol/L (ref 3.4–5.3)
Potassium: 3.7 mmol/L (ref 3.5–5.1)
SODIUM: 140 mmol/L (ref 135–145)
Sodium: 140 mmol/L (ref 137–147)

## 2016-07-21 LAB — CBC AND DIFFERENTIAL
HCT: 30 % — AB (ref 36–46)
HEMOGLOBIN: 8.5 g/dL — AB (ref 12.0–16.0)
Platelets: 240 10*3/uL (ref 150–399)
WBC: 7.5 10*3/mL

## 2016-07-21 LAB — CBC
HCT: 30.4 % — ABNORMAL LOW (ref 36.0–46.0)
Hemoglobin: 8.5 g/dL — ABNORMAL LOW (ref 12.0–15.0)
MCH: 24.3 pg — AB (ref 26.0–34.0)
MCHC: 28 g/dL — AB (ref 30.0–36.0)
MCV: 86.9 fL (ref 78.0–100.0)
PLATELETS: 240 10*3/uL (ref 150–400)
RBC: 3.5 MIL/uL — ABNORMAL LOW (ref 3.87–5.11)
RDW: 17 % — AB (ref 11.5–15.5)
WBC: 7.5 10*3/uL (ref 4.0–10.5)

## 2016-07-21 LAB — PROCALCITONIN

## 2016-07-21 LAB — OCCULT BLOOD X 1 CARD TO LAB, STOOL: FECAL OCCULT BLD: POSITIVE — AB

## 2016-07-21 MED ORDER — FUROSEMIDE 40 MG PO TABS
40.0000 mg | ORAL_TABLET | Freq: Two times a day (BID) | ORAL | Status: DC
  Administered 2016-07-21: 40 mg via ORAL
  Filled 2016-07-21: qty 1

## 2016-07-21 MED ORDER — POLYETHYLENE GLYCOL 3350 17 G PO PACK
17.0000 g | PACK | Freq: Every day | ORAL | Status: DC
  Administered 2016-07-21 – 2016-07-23 (×3): 17 g via ORAL
  Filled 2016-07-21 (×4): qty 1

## 2016-07-21 MED ORDER — SENNA 8.6 MG PO TABS
1.0000 | ORAL_TABLET | Freq: Every day | ORAL | Status: DC
  Administered 2016-07-21 – 2016-07-24 (×5): 8.6 mg via ORAL
  Filled 2016-07-21 (×4): qty 1

## 2016-07-21 NOTE — Progress Notes (Signed)
PROGRESS NOTE    Tonya Hogan  [id number] DOB: 1950/12/29 DOA: 07/19/2016 PCP: No PCP Per Patient   Brief Narrative:   65 y.o. BF PMHx  Bipolar DO, TIA, Obesity , HTN hypertension,, COPD on chronic home oxygen at night, CAD native artery status post CABG, Chronic Systolic CHF, Atrial Flutter S/P cardioversion on 8/25, Hypothyroidism  she was discharged from the hospital on 8/27 after TIA work up and treating aflutter /RVR, she  presents to [facility] with respiratory failure, she is found to be hypoxic in the 80's. cxr with bilateral infiltrate pulmonary edema vs pna, she does has leukocytosis, lactic acidosis, initial on presentation to ED she had brief hypotension required fluids boluses, she was put on bipap, hospitalist called who accepted the patient to be transferred to [facility] stepdown unit. She also reported chest tightness, [facility] EDP called cardiology as well. She was given vanc/zosyn and transferred to [place]. Patient currently report feeling better, denies fever, does report cough, more at night, currently chest pain free, able to talk in full sentences on 5 liter oxygen, she does report lower extremity edema.   Subjective: 8/29 A/O 4, states on 2 L O2 at home. Does not ambulate much. Report feeling better.  Dyspnea improved.  Slurred speech since last admission after stroke.    Assessment & Plan:   Active Problems:   Respiratory failure (HCC)   Hypoxia    Acute on chronic hypoxic respiratory failure: - initially required BiPAP. Better. -Most likely secondary to CHF. PCXR and patient's symptoms like consistent with infection. Stop antibiotics    Acute on Chronic Systolic CHF:  bnp elevated from basline. Troponin negative, ekg no acute changes, continue betablocker, hold acei for now due to tendency of hypotension, start iv lasix. Cardiology consulted. -Strict I&O since admission -8.8 L -Daily weight Filed Weights   07/19/16 1237 07/19/16 1824 07/21/16 0454    Weight: 125.2 kg (276 lb) 128.5 kg (283 lb 4.7 oz) 121.1 kg (267 lb)  -Lasix change to 40 mg BID.  -Continue amiodarone 200 mg BID -Patient's states base weight 260 pounds.  -on Oxygen 6 L. Off BIPAP this am.   A-flutter/TIA,  - continue betablocker/amiodarone/xarelto/asa. Cardiology following  CAD native artery s/p cabg:  -continue betablocker, statin, asa. Currently chest pain free, troponin negative, ekg no acute changes.  HTN:  -See CHF  Morbid Obesity:  -Body mass index is 55.33 kg/m. life style modification.  Psych:  -stable , continue home meds depakote/risperdal    DVT prophylaxis: Xarelto Code Status: Full Family Communication: None Disposition Plan: Resolution CHF exacerbation, home    Consultants:  [facility]    Procedures/Significant Events:  None   Cultures 8/22 MRSA by PCR positive 8/28 blood left forearm/right arm NGTD 8/28 urine pending 8/28 MRSA by PCR negative   Antimicrobials: Zosyn 8/28>> 8/29 Vancomycin 8/28>> 8/29   Devices    LINES / TUBES:      Continuous Infusions:    Objective: Vitals:   07/21/16 0353 07/21/16 0400 07/21/16 0454 07/21/16 0802  BP: (!) 105/44 (!) 142/69    Pulse: 67 70  67  Resp: 20   19  Temp: 98.2 F (36.8 C)     TempSrc: Axillary     SpO2: 100% 100%  96%  Weight:   121.1 kg (267 lb)   Height:   5' (1.524 m)     Intake/Output Summary (Last 24 hours) at 07/21/16 0819 Last data filed at 07/21/16 0356  Gross per 24  hour  Intake              240 ml  Output             3645 ml  Net            -3405 ml   Filed Weights   07/19/16 1237 07/19/16 1824 07/21/16 0454  Weight: 125.2 kg (276 lb) 128.5 kg (283 lb 4.7 oz) 121.1 kg (267 lb)    Examination:  General: A/O 4, positive acute on chronic respiratory distress Eyes: negative scleral hemorrhage, negative anisocoria, negative icterus ENT: Negative Runny nose, negative gingival bleeding, Neck:  Negative scars,  masses, torticollis, lymphadenopathy, JVD Lungs: bilateral air movement, no wheezing.  Cardiovascular: Regular rate and rhythm without murmur gallop or rub normal S1 and S2 Abdomen: MORBID OBESITY, negative abdominal pain, nondistended, positive soft, bowel sounds, no rebound, no ascites, no appreciable mass Extremities: trace edema Central nervous system: slurred speech since last admission.  .   CBC:  Recent Labs Lab 07/17/16 0959 07/18/16 0313 07/19/16 1240 07/20/16 0347  WBC 6.6 7.4 13.5* 10.7*  NEUTROABS  --   --  11.0*  --   HGB 8.8* 8.4* 8.4* 7.8*  HCT 30.5* 29.7* 28.9* 28.3*  MCV 85.4 86.1 85.3 86.8  PLT 236 253 262 [id number]   Basic Metabolic Panel:  Recent Labs Lab 07/17/16 0959 07/18/16 0313 07/19/16 1240 07/20/16 0347  NA 139 140 139 142  K 4.1 4.4 4.0 3.8  CL 100* 100* 101 100*  CO2 33* 34* 30 38*  GLUCOSE 152* 81 109* 102*  BUN 11 20 22* 13  CREATININE 0.92 1.03* 0.87 0.86  CALCIUM 8.6* 8.8* 8.5* 8.3*  MG  --   --   --  2.0   GFR: Estimated Creatinine Clearance: 79 mL/min (by C-G formula based on SCr of 0.86 mg/dL). Liver Function Tests:  Recent Labs Lab 07/19/16 1240  AST 14*  ALT 10*  ALKPHOS 53  BILITOT 0.1*  PROT 7.5  ALBUMIN 2.9*   No results for input(s): LIPASE, AMYLASE in the last 168 hours. No results for input(s): AMMONIA in the last 168 hours. Coagulation Profile:  Recent Labs Lab 07/19/16 1240  INR 1.10   Cardiac Enzymes:  Recent Labs Lab 07/19/16 1250  TROPONINI <0.03   BNP (last 3 results) No results for input(s): PROBNP in the last 8760 hours. HbA1C: No results for input(s): HGBA1C in the last 72 hours. CBG: No results for input(s): GLUCAP in the last 168 hours. Lipid Profile: No results for input(s): CHOL, HDL, LDLCALC, TRIG, CHOLHDL, LDLDIRECT in the last 72 hours. Thyroid Function Tests: No results for input(s): TSH, T4TOTAL, FREET4, T3FREE, THYROIDAB in the last 72 hours. Anemia Panel: No results for  input(s): VITAMINB12, FOLATE, FERRITIN, TIBC, IRON, RETICCTPCT in the last 72 hours. Urine analysis:    Component Value Date/Time   COLORURINE YELLOW 07/19/2016 1350   APPEARANCEUR CLEAR 07/19/2016 1350   LABSPEC 1.022 07/19/2016 1350   PHURINE 6.0 07/19/2016 1350   GLUCOSEU NEGATIVE 07/19/2016 1350   HGBUR NEGATIVE 07/19/2016 1350   BILIRUBINUR NEGATIVE 07/19/2016 1350   KETONESUR NEGATIVE 07/19/2016 1350   PROTEINUR NEGATIVE 07/19/2016 1350   UROBILINOGEN 1.0 10/06/2015 2010   NITRITE NEGATIVE 07/19/2016 1350   LEUKOCYTESUR NEGATIVE 07/19/2016 1350   Sepsis Labs: @LABRCNTIP (procalcitonin:4,lacticidven:4)  ) Recent Results (from the past 240 hour(s))  MRSA PCR Screening     Status: Abnormal   Collection Time: 07/13/16  6:46 PM  Result Value Ref Range Status  MRSA by PCR POSITIVE (A) NEGATIVE Final    Comment:        The GeneXpert MRSA Assay (FDA approved for NASAL specimens only), is one component of a comprehensive MRSA colonization surveillance program. It is not intended to diagnose MRSA infection nor to guide or monitor treatment for MRSA infections. RESULT CALLED TO, READ BACK BY AND VERIFIED WITH: T IRBY RN [id number] 07/13/16 A BROWNING   Blood Culture (routine x 2)     Status: None (Preliminary result)   Collection Time: 07/19/16  1:10 PM  Result Value Ref Range Status   Specimen Description BLOOD LEFT FOREARM  Final   Special Requests BOTTLES DRAWN AEROBIC AND ANAEROBIC 5CC EACH  Final   Culture   Final    NO GROWTH < 24 HOURS Performed at [facility]    Report Status PENDING  Incomplete  Blood Culture (routine x 2)     Status: None (Preliminary result)   Collection Time: 07/19/16  1:40 PM  Result Value Ref Range Status   Specimen Description BLOOD RIGHT ARM  Final   Special Requests BOTTLES DRAWN AEROBIC AND ANAEROBIC 5CC EACH  Final   Culture   Final    NO GROWTH < 24 HOURS Performed at [facility]    Report Status PENDING  Incomplete    MRSA PCR Screening     Status: None   Collection Time: 07/19/16  6:23 PM  Result Value Ref Range Status   MRSA by PCR NEGATIVE NEGATIVE Final    Comment:        The GeneXpert MRSA Assay (FDA approved for NASAL specimens only), is one component of a comprehensive MRSA colonization surveillance program. It is not intended to diagnose MRSA infection nor to guide or monitor treatment for MRSA infections.          Radiology Studies: Dg Chest Port 1 View  Result Date: 07/19/2016 CLINICAL DATA:  Shortness of breath. EXAM: PORTABLE CHEST 1 VIEW COMPARISON:  07/13/2016 FINDINGS: Prior CABG. Mild cardiomegaly with vascular congestion. Low lung volumes. Linear areas of scarring or atelectasis in the lungs bilaterally. No effusions. No acute bony abnormality. IMPRESSION: Areas of mid lung atelectasis or scarring. Cardiomegaly, vascular congestion. Low lung volumes. Electronically Signed   By: Rolm Baptise M.D.   On: 07/19/2016 13:19        Scheduled Meds: . amiodarone  200 mg Oral BID  . aspirin  81 mg Oral Daily  . atorvastatin  20 mg Oral QHS  . divalproex  1,000 mg Oral BID  . ferrous sulfate  325 mg Oral Q breakfast  . furosemide  60 mg Intravenous TID  . gabapentin  600 mg Oral BID  . levothyroxine  125 mcg Oral QAC breakfast  . risperiDONE  2 mg Oral QHS  . rivaroxaban  20 mg Oral Q supper   Continuous Infusions:    LOS: 2 days    Time spent: 35 minutes    Elmarie Shiley, MD [facility] Pager [phone]  If 7PM-7AM, please contact night-coverage [email or web address] Password [email or web address] 07/21/2016, 8:19 AM

## 2016-07-21 NOTE — Evaluation (Signed)
Physical Therapy Evaluation Patient Details Name: Tonya Hogan MRN: [id number] DOB: [birth year] Today's Date: 07/21/2016   History of Present Illness  Mrs. Tonya Hogan is a morbidly obese 65 yo AA female with PMH of HTN, bipolar disorder, h/o noncompliance, COPD, chronic anemia, chronic diastolic heart failure, thyroid CA --> hypothyroidism, and history of CAD s/p CABG on 01/31/2010 8/28 with altered mental status and acute hypoxia the day after discharge after recent TEE cardioversion  Clinical Impression  The patient is  Participatory, currently on HFNC 6 liters to keep saturation up. She has family but limited assistance. Recommend SNF rehab.  Pt admitted with above diagnosis. Pt currently with functional limitations due to the deficits listed below (see PT Problem List).  Pt will benefit from skilled PT to increase their independence and safety with mobility to allow discharge to the venue listed below.       Follow Up Recommendations SNF;Supervision/Assistance - 24 hour    Equipment Recommendations  None recommended by PT    Recommendations for Other Services OT consult     Precautions / Restrictions Precautions Precautions: Fall Precaution Comments: on HFNC, monitor sats Restrictions Weight Bearing Restrictions: No      Mobility  Bed Mobility Overal bed mobility: Modified Independent             General bed mobility comments: pt gort self to bed edge   Transfers Overall transfer level: Needs assistance Equipment used: None Transfers: Sit to/from [employer] Transfers Sit to Stand: Supervision Stand pivot transfers: Supervision       General transfer comment: from bed to recliner, on HFNC 6 liters.  sats maintained in  90's  Ambulation/Gait                Stairs            Wheelchair Mobility    Modified Rankin (Stroke Patients Only)       Balance   Sitting-balance support: Feet supported;No upper extremity supported Sitting  balance-Leahy Scale: Good     Standing balance support: During functional activity;Single extremity supported Standing balance-Leahy Scale: Good                               Pertinent Vitals/Pain Pain Assessment: No/denies pain    Home Living Family/patient expects to be discharged to:: Private residence Living Arrangements: Children;Other relatives Available Help at Discharge: Family;Available PRN/intermittently Type of Home: House Home Access: Stairs to enter Entrance Stairs-Rails: Right Entrance Stairs-Number of Steps: 3 Home Layout: One level Home Equipment: Walker - 2 wheels      Prior Function Level of Independence: Independent         Comments: does not drive, and does not perform IADLs      Hand Dominance        Extremity/Trunk Assessment   Upper Extremity Assessment: Defer to OT evaluation           Lower Extremity Assessment: RLE deficits/detail RLE Deficits / Details: grossly 4/5, WFL for standing    Cervical / Trunk Assessment: Normal  Communication      Cognition Arousal/Alertness: Awake/alert Behavior During Therapy: WFL for tasks assessed/performed;Impulsive Overall Cognitive Status: Within Functional Limits for tasks assessed Area of Impairment: Attention;Safety/judgement;Awareness                    General Comments      Exercises        Assessment/Plan    PT Assessment  PT Diagnosis Difficulty walking;Abnormality of gait;Generalized weakness   PT Problem List Decreased strength;Decreased activity tolerance;Decreased mobility;Obesity;Cardiopulmonary status limiting activity;Decreased safety awareness  PT Treatment Interventions DME instruction;Gait training;Functional mobility training;Therapeutic activities;Therapeutic exercise;Patient/family education   PT Goals (Current goals can be found in the Care Plan section) Acute Rehab PT Goals Patient Stated Goal: to go somewhere to get better. PT Goal  Formulation: With patient Time For Goal Achievement: 08/04/16 Potential to Achieve Goals: Good    Frequency Min 3X/week   Barriers to discharge        Co-evaluation               End of Session Equipment Utilized During Treatment: Oxygen Activity Tolerance: Patient tolerated treatment well Patient left: in chair;with call bell/phone within reach;with chair alarm set Nurse Communication: Mobility status         Time: [id number] PT Time Calculation (min) (ACUTE ONLY): 42 min   Charges:   PT Evaluation $PT Eval Moderate Complexity: 1 Procedure PT Treatments $Therapeutic Exercise: 8-22 mins $Self Care/Home Management: 8-22   PT G Codes:        Claretha Cooper 07/21/2016, 11:51 AM Tresa Endo PT [phone]

## 2016-07-21 NOTE — Progress Notes (Addendum)
Patient Name: Tonya Hogan Date of Encounter: 07/21/2016   Primary Cardiologist: HF clinic - Amy Clegg  Pt. Profile  Tonya Hogan is a morbidly obese 65 yo AA female with PMH of HTN, bipolar disorder, h/o noncompliance, COPD, chronic anemia, chronic diastolic heart failure, thyroid CA --> hypothyroidism, history of atrial myxoma and PFO that were treated at [facility] previously. The patient presented 8/28 with altered mental status and acute hypoxia the day after discharge after recent TEE cardioversion.  SUBJECTIVE  Breathing improved.   CURRENT MEDS . amiodarone  200 mg Oral BID  . aspirin  81 mg Oral Daily  . atorvastatin  20 mg Oral QHS  . divalproex  1,000 mg Oral BID  . ferrous sulfate  325 mg Oral Q breakfast  . furosemide  60 mg Intravenous TID  . gabapentin  600 mg Oral BID  . levothyroxine  125 mcg Oral QAC breakfast  . polyethylene glycol  17 g Oral Daily  . risperiDONE  2 mg Oral QHS  . rivaroxaban  20 mg Oral Q supper  . senna  1 tablet Oral Daily    OBJECTIVE  Vitals:   07/21/16 0353 07/21/16 0400 07/21/16 0454 07/21/16 0802  BP: (!) 105/44 (!) 142/69    Pulse: 67 70  67  Resp: 20   19  Temp: 98.2 F (36.8 C)     TempSrc: Axillary     SpO2: 100% 100%  96%  Weight:   267 lb (121.1 kg)   Height:   5' (1.524 m)     Intake/Output Summary (Last 24 hours) at 07/21/16 0951 Last data filed at 07/21/16 0356  Gross per 24 hour  Intake              240 ml  Output             3645 ml  Net            -3405 ml   Filed Weights   07/19/16 1237 07/19/16 1824 07/21/16 0454  Weight: 276 lb (125.2 kg) 283 lb 4.7 oz (128.5 kg) 267 lb (121.1 kg)    PHYSICAL EXAM  General: Pleasant, obese NAD. Neuro: Alert and oriented X 3. Moves all extremities spontaneously. Psych: Normal affect. HEENT:  Normal  Neck: Supple without bruits or diminished breath sound bibasilar Lungs:  Resp regular and unlabored, CTA. Heart: RRR no s3, s4, or murmurs. Abdomen: Soft,  non-tender, non-distended, BS + x 4.  Extremities: No clubbing, cyanosis or edema. DP/PT/Radials 2+ and equal bilaterally.  Accessory Clinical Findings  CBC  Recent Labs  07/19/16 1240 07/20/16 0347  WBC 13.5* 10.7*  NEUTROABS 11.0*  --   HGB 8.4* 7.8*  HCT 28.9* 28.3*  MCV 85.3 86.8  PLT 262 [id number]   Basic Metabolic Panel  Recent Labs  07/19/16 1240 07/20/16 0347  NA 139 142  K 4.0 3.8  CL 101 100*  CO2 30 38*  GLUCOSE 109* 102*  BUN 22* 13  CREATININE 0.87 0.86  CALCIUM 8.5* 8.3*  MG  --  2.0   Liver Function Tests  Recent Labs  07/19/16 1240  AST 14*  ALT 10*  ALKPHOS 53  BILITOT 0.1*  PROT 7.5  ALBUMIN 2.9*   No results for input(s): LIPASE, AMYLASE in the last 72 hours. Cardiac Enzymes  Recent Labs  07/19/16 1250  TROPONINI <0.03   BNP Invalid input(s): POCBNP D-Dimer No results for input(s): DDIMER in the last 72 hours. Hemoglobin A1C No results for  input(s): HGBA1C in the last 72 hours. Fasting Lipid Panel No results for input(s): CHOL, HDL, LDLCALC, TRIG, CHOLHDL, LDLDIRECT in the last 72 hours. Thyroid Function Tests No results for input(s): TSH, T4TOTAL, T3FREE, THYROIDAB in the last 72 hours.  Invalid input(s): FREET3  TELE  Sinus rhythm  Radiology/Studies  Dg Chest 2 View  Result Date: 07/13/2016 CLINICAL DATA:  Slurred speech. EXAM: CHEST  2 VIEW COMPARISON:  Radiographs of April 30, 2016. FINDINGS: Stable cardiomediastinal silhouette. Sternotomy wires are noted. No pneumothorax or pleural effusion is noted. Bony thorax is unremarkable. Slightly improved linear densities are noted in the perihilar regions consistent with improving subsegmental atelectasis. IMPRESSION: Mildly improved bilateral parahilar subsegmental atelectasis. Electronically Signed   By: Marijo Conception, M.D.   On: 07/13/2016 14:56   Dg Chest 2 View  Result Date: 06/30/2016 CLINICAL DATA:  Initial evaluation for medical clearance. Mental health admission. EXAM:  CHEST  2 VIEW COMPARISON:  Prior radiograph from 04/07/2016. FINDINGS: Median sternotomy wires noted, stable. Mild cardiomegaly is unchanged. Mediastinal silhouette within normal limits. Atheromatous plaque within the aortic arch. Lungs are normally inflated. Mild perihilar vascular congestion without pulmonary edema. No pleural effusion. The scarring at the mid left lung again noted, similar to prior. Streaky right perihilar opacities also compatible with scarring and/or atelectasis. No consolidative airspace disease. No pneumothorax. Surgical clips overlie the lower neck. No acute osseous abnormality. IMPRESSION: 1. Mild perihilar vascular congestion without overt pulmonary edema. 2. Scarring at the left mid and lower lung, similar to prior. Streaky right perihilar opacities also compatible with atelectasis and/or scarring. 3. Aortic atherosclerosis. Electronically Signed   By: Jeannine Boga M.D.   On: 06/30/2016 20:48   Ct Head Wo Contrast  Result Date: 07/13/2016 CLINICAL DATA:  Slurred speech beginning last night. EXAM: CT HEAD WITHOUT CONTRAST TECHNIQUE: Contiguous axial images were obtained from the base of the skull through the vertex without intravenous contrast. COMPARISON:  MRI 10/07/2015 FINDINGS: Brain: No acute intracranial abnormality. Specifically, no hemorrhage, hydrocephalus, mass lesion, acute infarction, or significant intracranial injury. Vascular: No hyperdense vessel or unexpected calcification. Skull: No acute calvarial abnormality Sinuses/Orbits: Visualized paranasal sinuses and mastoids clear. Orbital soft tissues unremarkable. Other: None IMPRESSION: No acute intracranial abnormality. Electronically Signed   By: Rolm Baptise M.D.   On: 07/13/2016 14:45   Ct Angio Chest Pe W And/or Wo Contrast  Result Date: 07/13/2016 CLINICAL DATA:  Tachycardia, shoulder pain. EXAM: CT ANGIOGRAPHY CHEST WITH CONTRAST TECHNIQUE: Multidetector CT imaging of the chest was performed using the  standard protocol during bolus administration of intravenous contrast. Multiplanar CT image reconstructions and MIPs were obtained to evaluate the vascular anatomy. CONTRAST:  100 mL of Isovue 370 intravenously. COMPARISON:  CT scan of June 22, 2015. FINDINGS: No pneumothorax or pleural effusion is noted. Mild bibasilar subsegmental atelectasis is noted. There is no evidence of large central pulmonary embolus. However, due to limitation in terms of timing of contrast bolus, smaller peripheral emboli in lower lobe branches cannot be excluded on the basis of this exam. Status post coronary artery bypass graft. Atherosclerosis of thoracic aorta is noted without aneurysm or dissection. Visualized portion of upper abdomen is unremarkable. No mediastinal mass or adenopathy is noted. Review of the MIP images confirms the above findings. IMPRESSION: Aortic atherosclerosis. Mild bibasilar subsegmental atelectasis. No evidence of large central pulmonary embolus. However, due to limitations of contrast bolus, smaller peripheral emboli in lower lobe branches cannot be excluded on the basis of this exam. Electronically Signed  By: Marijo Conception, M.D.   On: 07/13/2016 17:11   Mr Brain Wo Contrast  Result Date: 07/14/2016 CLINICAL DATA:  65 y/o F; presenting with right facial droop and slurred speech beginning 4 days ago. EXAM: MRI HEAD WITHOUT CONTRAST MRA HEAD WITHOUT CONTRAST TECHNIQUE: Multiplanar, multiecho pulse sequences of the brain and surrounding structures were obtained without intravenous contrast. Angiographic images of the head were obtained using MRA technique without contrast. COMPARISON:  CT of head dated 07/13/2016.  MRI brain 10/07/2015. FINDINGS: MRI HEAD FINDINGS Brain: No diffusion restriction to suggest acute infarct. There are punctate foci of susceptibility hypointensity within the cerebellum and bilateral frontal and parietal subcortical white matter without significant interval change compatible  with prior microhemorrhage. Motion artifact partially degrades the FLAIR sequence. 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 The left parietal and temporal infarcts have mild foraminal 7 staining. No focal mass effect. Mild parenchymal atrophy. Extra-axial space: No hydrocephalus. No midline shift. No effacement of basilar cisterns. No extra-axial collection is identified. Proximal intracranial flow voids are maintained. No abnormality of the cervical medullary junction. Other: No abnormal signal of the paranasal sinuses. Partial opacification of right mastoid air cells. Orbits are unremarkable. Calvarium is unremarkable. 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 Anterior cerebral arteries:  Patent. Middle cerebral arteries: Patent. Anterior communicating artery: Patent. Posterior communicating arteries:  Patent. Posterior cerebral arteries: Patent bilaterally. Small left P1 segment with large left posterior communicating artery consistent with fetal circulation, normal variant. Basilar artery:  Patent. Vertebral arteries:  Patent.  Left dominant. No evidence of significant stenosis or large vessel occlusion. IMPRESSION: 1. No evidence of acute/early subacute infarct or new intracranial hemorrhage. 2. 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 3. Stable parenchymal volume loss and chronic infarcts within left temporal, left parietal, and right cerebellum. 4. No significant stenosis or large vessel occlusion of the circle of Willis. These results will be called to the ordering clinician or representative by the Radiologist Assistant, and communication documented in the PACS or zVision Dashboard. Electronically Signed   By: Kristine Garbe M.D.    On: 07/14/2016 01:58   Dg Chest Port 1 View  Result Date: 07/19/2016 CLINICAL DATA:  Shortness of breath. EXAM: PORTABLE CHEST 1 VIEW COMPARISON:  07/13/2016 FINDINGS: Prior CABG. Mild cardiomegaly with vascular congestion. Low lung volumes. Linear areas of scarring or atelectasis in the lungs bilaterally. No effusions. No acute bony abnormality. IMPRESSION: Areas of mid lung atelectasis or scarring. Cardiomegaly, vascular congestion. Low lung volumes. Electronically Signed   By: Rolm Baptise M.D.   On: 07/19/2016 13:19   Mr Jodene Nam Head/brain [id number] Cm  Result Date: 07/14/2016 CLINICAL DATA:  65 y/o F; presenting with right facial droop and slurred speech beginning 4 days ago. EXAM: MRI HEAD WITHOUT CONTRAST MRA HEAD WITHOUT CONTRAST TECHNIQUE: Multiplanar, multiecho pulse sequences of the brain and surrounding structures were obtained without intravenous contrast. Angiographic images of the head were obtained using MRA technique without contrast. COMPARISON:  CT of head dated 07/13/2016.  MRI brain 10/07/2015. FINDINGS: MRI HEAD FINDINGS Brain: No diffusion restriction to suggest acute infarct. There are punctate foci of susceptibility hypointensity within the cerebellum and bilateral frontal and parietal subcortical white matter without significant interval change compatible with prior microhemorrhage. Motion artifact partially degrades  the FLAIR sequence. 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 The left parietal and temporal infarcts have mild foraminal 7 staining. No focal mass effect. Mild parenchymal atrophy. Extra-axial space: No hydrocephalus. No midline shift. No effacement of basilar cisterns. No extra-axial collection is identified. Proximal intracranial flow voids are maintained. No abnormality of the cervical medullary junction. Other: No abnormal signal of the paranasal sinuses. Partial opacification of right mastoid air  cells. Orbits are unremarkable. Calvarium is unremarkable. 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 Anterior cerebral arteries:  Patent. Middle cerebral arteries: Patent. Anterior communicating artery: Patent. Posterior communicating arteries:  Patent. Posterior cerebral arteries: Patent bilaterally. Small left P1 segment with large left posterior communicating artery consistent with fetal circulation, normal variant. Basilar artery:  Patent. Vertebral arteries:  Patent.  Left dominant. No evidence of significant stenosis or large vessel occlusion. IMPRESSION: 1. No evidence of acute/early subacute infarct or new intracranial hemorrhage. 2. 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 3. Stable parenchymal volume loss and chronic infarcts within left temporal, left parietal, and right cerebellum. 4. No significant stenosis or large vessel occlusion of the circle of Willis. These results will be called to the ordering clinician or representative by the Radiologist Assistant, and communication documented in the PACS or zVision Dashboard. Electronically Signed   By: Kristine Garbe M.D.   On: 07/14/2016 01:58    ASSESSMENT AND PLAN   1. Acute respiratory failure, likely multifactorial given obesity hypoventilation, possible pneumonia, and acute on chronic systolic heart failure - Feeling better after IV Abx. On supplemental oxygen.  2. Acute on chronic systolic CHF - BNP 0000000. Net I & O of negative 8.2L with 9lb weight loss. Recent echo 07/16/16 with EF of 35-40%. Likley due to tachycardia. Appear very close to euvolemic. Received Iv lasix 60mg  this morning. Will switch to po lasix 40mg  BID. At home she was on lasix 20mg  qd. Will need to recheck 2D in 3 months - consider restarting BB and ACE - BP has been stable.    History of atrial myxoma and PFO treated at [facility] in the past  4. Persistent atrial flutter s/p TEE DCCV 07/16/2016 - Maintaining sinus rhythm. Continue amiodarone, Toprol XL and Xarelto                   5. Anemia - Hgb of 7.8 today. It has been gradually declined from 9.1 since 07/13/16. It was 9.9 06/30/16. No BM since this admission. On Senna. Will get stool guaiac. (Tyron Manetta: It is important to evaluate her anemia at this time so that decisions can be made concerning the ongoing use of her Xarelto.)  (Raileigh Sabater)Anticoagulation:    The patient currently is on Xarelto. It has been noted that the plan at [facility] was to continue anticoagulant for 6 weeks after her TEE cardioversion. She remains on anticoagulation. However her anemia is concerning. It is important to assess her anemia further and then decide if we must stop her anticoagulation.  Jarrett Soho PA-C Pager [phone] Patient seen and examined. I agree with the assessment and plan as detailed above. See also my additional thoughts below.   Today we are switching her diuretic to oral. I'm concerned about the drop in her hemoglobin. I have mentioned above in the assessment and  plan we will continue her anticoagulation today. However we need to decide if it has to be stopped. The initial plan was to use anticoagulation for 6 weeks after her TEE cardioversion.  Dola Argyle, MD, [facility] 07/21/2016 10:57 AM

## 2016-07-21 NOTE — Evaluation (Signed)
Clinical/Bedside Swallow Evaluation Patient Details  Name: Tonya Hogan MRN: [id number] Date of Birth: Mar 13, 1951  Today's Date: 07/21/2016 Time: SLP Start Time (ACUTE ONLY): 1009 SLP Stop Time (ACUTE ONLY): 1025 SLP Time Calculation (min) (ACUTE ONLY): 16 min  Past Medical History:  Past Medical History:  Diagnosis Date  . Arthritis   . Bipolar 1 disorder ([place])   . CAD (coronary artery disease)   . CHF (congestive heart failure) ([place])   . Depression   . Hypertension   . Hypothyroidism   . Obesity   . S/P CABG x 3   . Thyroid ca ([place])   . Thyroid disease    Past Surgical History:  Past Surgical History:  Procedure Laterality Date  . ABDOMINAL HYSTERECTOMY    . CARDIOVERSION N/A 07/16/2016   Procedure: CARDIOVERSION;  Surgeon: Jerline Pain, MD;  Location: [place];  Service: Cardiovascular;  Laterality: N/A;  . CORONARY ARTERY BYPASS GRAFT    . TEE WITHOUT CARDIOVERSION N/A 07/16/2016   Procedure: TRANSESOPHAGEAL ECHOCARDIOGRAM (TEE);  Surgeon: Jerline Pain, MD;  Location: [facility] ENDOSCOPY;  Service: Cardiovascular;  Laterality: N/A;  . TONSILLECTOMY     HPI:  Pt is 65 y.o female with PMH of HTN, bipolar disorder, h/o noncompliance, COPD, chronic anemia, chronic diastolic heart failure, thyroid CA -->hypothyroidism, and history of CAD s/p CABG on 01/31/2010 at [facility] presented 8/28 with altered mental status and acute hypoxia the day after discharge after recent TEE cardioversion   Assessment / Plan / Recommendation Clinical Impression  Pt consumed ice chips, thin liquids, purees and solid foods with no s/s of aspiration. Pt displayed normal vocal quality throughout swallowing evaluation. Pt answered all questions and followed commands with min cueing. Recommend pt continue with regular diet and thin liquids. SLP will s/o at this time.    Aspiration Risk  No limitations    Diet Recommendation Regular;Thin liquid   Liquid Administration via: Straw Medication  Administration: Whole meds with liquid Supervision: Patient able to self feed Compensations: Small sips/bites;Slow rate;Minimize environmental distractions Postural Changes: Remain upright for at least 30 minutes after po intake;Seated upright at 90 degrees    Other  Recommendations Oral Care Recommendations: Oral care BID   Follow up Recommendations  None    Frequency and Duration            Prognosis Prognosis for Safe Diet Advancement: Good Barriers to Reach Goals: Cognitive deficits      Swallow Study   General HPI: Pt is 65 y.o female with PMH of HTN, bipolar disorder, h/o noncompliance, COPD, chronic anemia, chronic diastolic heart failure, thyroid CA -->hypothyroidism, and history of CAD s/p CABG on 01/31/2010 at [facility] presented 8/28 with altered mental status and acute hypoxia the day after discharge after recent TEE cardioversion Type of Study: Bedside Swallow Evaluation Diet Prior to this Study: Regular;Thin liquids Temperature Spikes Noted: No Respiratory Status: Nasal cannula History of Recent Intubation: No Behavior/Cognition: Alert;Cooperative;Pleasant mood Oral Cavity Assessment: Within Functional Limits Oral Care Completed by SLP: No Oral Cavity - Dentition: Edentulous Vision: Functional for self-feeding Self-Feeding Abilities: Able to feed self Patient Positioning: Upright in bed Baseline Vocal Quality: Normal Volitional Cough: Strong Volitional Swallow: Able to elicit    Oral/Motor/Sensory Function Overall Oral Motor/Sensory Function: Within functional limits   Ice Chips Ice chips: Within functional limits Presentation: Self Fed;Spoon   Thin Liquid Thin Liquid: Within functional limits Presentation: Straw;Self Fed    Nectar Thick Nectar Thick Liquid: Not tested   Honey Thick  Honey Thick Liquid: Not tested   Puree Puree: Within functional limits Presentation: Self Fed;Spoon   Solid   GO   [name], Student SLP Solid: Within  functional limits Presentation: [place] 07/21/2016,11:18 AM

## 2016-07-21 NOTE — Clinical Social Work Note (Signed)
CSW tried calling patient's daughter for assessment and to discuss SNF because patient not oriented to situation. No answer and voicemail not set up. Will try again tomorrow.  Dayton Scrape, [place]

## 2016-07-21 NOTE — Progress Notes (Addendum)
RN paged pt's latest Hgb and a positive occult stool. NP reviewed chart and called RN. RN states Hgb has been trended due to pt being on Xeralto and having noted hematuria. This positive stool is the first one. Pt has already had dose of Xeralto today. Pt is hemodynamically stable without complaints.  Plan tonight: recheck H/H q6hr, first one at 0200. RN to call for any further bloody stools, dizziness, tachycardia, and/or hypotension and txt page Hgb results at 0200. Should pt have further bleeding prior to 0200, will revise plan. For now, clear liquid diet. Plan discussed with RN.  KJKG, NP [facility] Update: Pt has not had any further melana. Her BP has been low between 76-100, better after one bolus. She is making urine. She has no complaints. Last Hgb stable at 8. Ordered another one for 7am. Pt is not tachycardic.  Will leave sign out to attending. [name] to be held until use approved by attending this am.  Tonya Duel, NP [facility]

## 2016-07-22 DIAGNOSIS — D509 Iron deficiency anemia, unspecified: Secondary | ICD-10-CM | POA: Diagnosis not present

## 2016-07-22 DIAGNOSIS — D151 Benign neoplasm of heart: Secondary | ICD-10-CM | POA: Diagnosis not present

## 2016-07-22 DIAGNOSIS — Q211 Atrial septal defect: Secondary | ICD-10-CM

## 2016-07-22 DIAGNOSIS — Z9229 Personal history of other drug therapy: Secondary | ICD-10-CM

## 2016-07-22 DIAGNOSIS — Q2112 Patent foramen ovale: Secondary | ICD-10-CM

## 2016-07-22 LAB — BASIC METABOLIC PANEL
Anion gap: 7 (ref 5–15)
BUN: 18 mg/dL (ref 4–21)
BUN: 18 mg/dL (ref 6–20)
CHLORIDE: 92 mmol/L — AB (ref 101–111)
CO2: 41 mmol/L — AB (ref 22–32)
CREATININE: 0.91 mg/dL (ref 0.44–1.00)
Calcium: 8.3 mg/dL — ABNORMAL LOW (ref 8.9–10.3)
Creatinine: 0.9 mg/dL (ref 0.5–1.1)
GFR calc Af Amer: >60 mL/min (ref >60)
GFR calc non Af Amer: >60 mL/min (ref >60)
GLUCOSE: 75 mg/dL
Glucose, Bld: 75 mg/dL (ref 65–99)
Potassium: 3.6 mmol/L (ref 3.4–5.3)
Potassium: 3.6 mmol/L (ref 3.5–5.1)
Sodium: 140 mmol/L (ref 135–145)
Sodium: 140 mmol/L (ref 137–147)

## 2016-07-22 LAB — CBC AND DIFFERENTIAL
HEMATOCRIT: 31 % — AB (ref 36–46)
HEMATOCRIT: 32 % — AB (ref 36–46)
HEMOGLOBIN: 8.9 g/dL — AB (ref 12.0–16.0)
Hemoglobin: 8.7 g/dL — AB (ref 12.0–16.0)
PLATELETS: 250 10*3/uL (ref 150–399)
WBC: 6.3 10*3/mL

## 2016-07-22 LAB — CBC
HEMATOCRIT: 30.9 % — AB (ref 36.0–46.0)
HEMOGLOBIN: 8.7 g/dL — AB (ref 12.0–15.0)
MCH: 24.4 pg — AB (ref 26.0–34.0)
MCHC: 28.2 g/dL — ABNORMAL LOW (ref 30.0–36.0)
MCV: 86.6 fL (ref 78.0–100.0)
Platelets: 250 10*3/uL (ref 150–400)
RBC: 3.57 MIL/uL — AB (ref 3.87–5.11)
RDW: 17 % — ABNORMAL HIGH (ref 11.5–15.5)
WBC: 6.3 10*3/uL (ref 4.0–10.5)

## 2016-07-22 LAB — HEMOGLOBIN AND HEMATOCRIT, BLOOD
HCT: 31.8 % — ABNORMAL LOW (ref 36.0–46.0)
HEMATOCRIT: 28.9 % — AB (ref 36.0–46.0)
Hemoglobin: 8 g/dL — ABNORMAL LOW (ref 12.0–15.0)
Hemoglobin: 8.9 g/dL — ABNORMAL LOW (ref 12.0–15.0)

## 2016-07-22 MED ORDER — FUROSEMIDE 40 MG PO TABS
40.0000 mg | ORAL_TABLET | Freq: Two times a day (BID) | ORAL | Status: DC
  Administered 2016-07-22 – 2016-07-24 (×4): 40 mg via ORAL
  Filled 2016-07-22 (×5): qty 1

## 2016-07-22 MED ORDER — HYDROCORTISONE ACETATE 25 MG RE SUPP
25.0000 mg | Freq: Two times a day (BID) | RECTAL | Status: AC
  Administered 2016-07-22 – 2016-07-23 (×4): 25 mg via RECTAL
  Filled 2016-07-22 (×4): qty 1

## 2016-07-22 MED ORDER — SODIUM CHLORIDE 0.9 % IV BOLUS (SEPSIS)
500.0000 mL | Freq: Once | INTRAVENOUS | Status: AC
  Administered 2016-07-22: 500 mL via INTRAVENOUS

## 2016-07-22 MED ORDER — RIVAROXABAN 20 MG PO TABS
20.0000 mg | ORAL_TABLET | Freq: Every day | ORAL | Status: DC
  Administered 2016-07-22 – 2016-07-23 (×2): 20 mg via ORAL
  Filled 2016-07-22 (×2): qty 1

## 2016-07-22 NOTE — Evaluation (Signed)
Occupational Therapy Evaluation Patient Details Name: Tonya Hogan MRN: [id number] DOB: 1951-01-19 Today's Date: 07/22/2016    History of Present Illness Mrs. Kuwahara is a morbidly obese 65 yo AA female with PMH of HTN, bipolar disorder, h/o noncompliance, COPD, chronic anemia, chronic diastolic heart failure, thyroid CA --> hypothyroidism, and history of CAD s/p CABG on 01/31/2010 8/28 with altered mental status and acute hypoxia the day after discharge after recent TEE cardioversion   Clinical Impression   PTA, pt was independent with basic ADLs and used RW for mobility. Pt currently requires min assist for seated LB ADLs and supervision for basic transfers primarily for O2 line management. Pt plans to d/c home with 24/7 assistance from her daughter and grandsons. Pt will benefit from continued acute OT to increase independence and safety with ADLs and mobility to allow for safe discharge home. No OT follow up or DME recommended at this time.    Follow Up Recommendations  No OT follow up;Supervision - Intermittent    Equipment Recommendations  None recommended by OT    Recommendations for Other Services       Precautions / Restrictions Precautions Precautions: Fall Precaution Comments: on HFNC, monitor sats Restrictions Weight Bearing Restrictions: No      Mobility Bed Mobility Overal bed mobility: Modified Independent Bed Mobility: Supine to Sit;Sit to Supine              Transfers Overall transfer level: Needs assistance Equipment used: Rolling walker (2 wheeled) Transfers: Sit to/from [facility] Sit to Stand: Supervision Stand pivot transfers: Supervision       General transfer comment: Supervision primarily for O2 line management    Balance Overall balance assessment: Needs assistance Sitting-balance support: No upper extremity supported;Feet supported Sitting balance-Leahy Scale: Good     Standing balance support: No upper extremity  supported;During functional activity Standing balance-Leahy Scale: Good                              ADL Overall ADL's : Needs assistance/impaired     Grooming: Wash/dry hands;Wash/dry face;Supervision/safety;Standing   Upper Body Bathing: Supervision/ safety;Sitting   Lower Body Bathing: Minimal assistance;Sit to/from stand Lower Body Bathing Details (indicate cue type and reason): due to body habitus         Toilet Transfer: Supervision/safety   Toileting- Clothing Manipulation and Hygiene: Supervision/safety;Sitting/lateral lean       Functional mobility during ADLs: Supervision/safety;Rolling walker       Vision Vision Assessment?: No apparent visual deficits   Perception     Praxis      Pertinent Vitals/Pain Pain Assessment: 0-10 Pain Score: 2  Pain Location: LLE Pain Descriptors / Indicators: Aching Pain Intervention(s): Monitored during session;Repositioned     Hand Dominance Right   Extremity/Trunk Assessment Upper Extremity Assessment Upper Extremity Assessment: Overall WFL for tasks assessed   Lower Extremity Assessment Lower Extremity Assessment: Defer to PT evaluation   Cervical / Trunk Assessment Cervical / Trunk Assessment: Normal   Communication Communication Communication: No difficulties   Cognition Arousal/Alertness: Awake/alert Behavior During Therapy: WFL for tasks assessed/performed                       General Comments       Exercises       Shoulder Instructions      Home Living Family/patient expects to be discharged to:: Private residence Living Arrangements: Children;Other relatives Available Help at Discharge:  Family;Available PRN/intermittently Type of Home: House Home Access: Stairs to enter [employer] of Steps: 3 Entrance Stairs-Rails: Right Home Layout: One level     Bathroom Shower/Tub: Occupational psychologist: Standard     Home Equipment: Environmental consultant - 2 wheels    Additional Comments: Pt reports that her daughter has order her a shower chair, handheld showerhead and BSC      Prior Functioning/Environment Level of Independence: Independent with assistive device(s)        Comments: Uses RW at all times, does not drive; does most cooking for family    OT Diagnosis: Generalized weakness;Acute pain   OT Problem List: Decreased activity tolerance;Impaired balance (sitting and/or standing);Decreased safety awareness;Decreased knowledge of use of DME or AE;Obesity;Pain;Cardiopulmonary status limiting activity   OT Treatment/Interventions: Self-care/ADL training;Therapeutic exercise;Energy conservation;DME and/or AE instruction;Therapeutic activities;Patient/family education;Balance training    OT Goals(Current goals can be found in the care plan section) Acute Rehab OT Goals Patient Stated Goal: to go home OT Goal Formulation: With patient Time For Goal Achievement: 08/05/16 Potential to Achieve Goals: Good ADL Goals Pt Will Perform Lower Body Dressing: with modified independence;with adaptive equipment;sit to/from stand Pt Will Perform Tub/Shower Transfer: Shower transfer;with modified independence;ambulating;shower seat;rolling walker Additional ADL Goal #1: Pt will verbalize and/or utilize 3 energy conservation strategies to maximize independence with ADL tasks.  OT Frequency: Min 2X/week   Barriers to D/C:            Co-evaluation              End of Session Equipment Utilized During Treatment: Surveyor, mining Communication: Mobility status  Activity Tolerance: Patient tolerated treatment well Patient left: in bed;with call bell/phone within reach;with bed alarm set   Time: [id number] OT Time Calculation (min): 38 min Charges:  OT General Charges $OT Visit: 1 Procedure OT Evaluation $OT Eval Moderate Complexity: 1 Procedure OT Treatments $Self Care/Home Management : 23-37 mins G-Codes:    Redmond Baseman, OTR/L [phone] 07/22/2016, 4:41 PM

## 2016-07-22 NOTE — Clinical Social Work Note (Signed)
Clinical Social Work Assessment  Patient Details  Name: Tonya Hogan MRN: [id number] Date of Birth: Oct 30, 1951  Date of referral:  07/22/16               Reason for consult:  Facility Placement                Permission sought to share information with:  Family Supports Permission granted to share information::  Yes, Verbal Permission Granted  Name::     Tonya Hogan   Relationship::  Daughter  Contact Information:  [phone]  Housing/Transportation Living arrangements for the past 2 months:  Single Family Home Source of Information:  Patient Patient Interpreter Needed:  None Criminal Activity/Legal Involvement Pertinent to Current Situation/Hospitalization:  No - Comment as needed Significant Relationships:  Adult Children, Other Family Members Lives with:  Adult Children Do you feel safe going back to the place where you live?  Yes Need for family participation in patient care:  Yes (Comment)  Care giving concerns:  No family/friends at bedside, however patient states that her daughter is currently injured and limited on how much assistance she can provide at discharge.   Social Worker assessment / plan:  Holiday representative met with patient at bedside to offer support and discuss patient needs at discharge.  Patient states that she currently lives at home with her daughter.  Patient is agreeable with SNF placement and states that she is currently living in [facility] so would like to remain close.  CSW initiated SNF search and will follow up with patient regarding available bed offers.  CSW remains available for support and to facilitate patient discharge needs once medically stable.  Employment status:  Disabled (Comment on whether or not currently receiving Disability) Insurance information:  Managed Medicare PT Recommendations:  [place] / Referral to community resources:  [place]  Patient/Family's Response to care:  Patient  verbalizes understanding and appreciation for CSW role and support.  Patient is agreeable with SNF placement once medically ready.    Patient/Family's Understanding of and Emotional Response to Diagnosis, Current Treatment, and Prognosis:  Patient with inconsistent conversation and thought process but does understanding the reason for SNF and remains agreeable.  Patient does not express any conversation regarding her current medical conditions.  Emotional Assessment Appearance:  Appears stated age Attitude/Demeanor/Rapport:   (Engaged and Appropriate) Affect (typically observed):  Accepting, Appropriate, Happy Orientation:  Oriented to Self, Oriented to Situation, Oriented to Place, Oriented to  Time Alcohol / Substance use:  Not Applicable Psych involvement (Current and /or in the community):  No (Comment)  Discharge Needs  Concerns to be addressed:  Discharge Planning Concerns Readmission within the last 30 days:  No Current discharge risk:  Physical Impairment Barriers to Discharge:  Continued Medical Work up  [employer], [place]

## 2016-07-22 NOTE — Progress Notes (Signed)
Patient's HGB this morning was 7.8, re-checked 8.5. Fecal occult blood test which came back positive. Did not have any anti-coagulation ordered during my shift. Paged NP Baltazar Najjar to inform her of patient's status. She spoke with me on the phone and I explained how this was her first positive fecal occult stool. She also had her first BM during my shift which was reddish brown and her urine also. She ordered an H&H q6h and a clear liquid diet. Around 2115, Patient's BP became low 78/69, re-checked 98/60 then again 91/47 HR 62. Paged NP Baltazar Najjar and she ordered a one time dose of normal saline bolus 500 cc and a STAT H&H. Patient received bolus. Re-checked BP 105/52. HR 64. Around 0040, BP dipped down to 76/40 MAP 50. Received h&h results back, HGB 8.0 HCT 28.9. Paged NP Baltazar Najjar to let her know of the BP and H&H. Received orders to take manual BP now and every 2 hours x3 and page her the results. I have taken the BP manually: 83/38 HR 67. Paged NP Baltazar Najjar about the BP. Will continue to monitor.

## 2016-07-22 NOTE — Progress Notes (Signed)
PROGRESS NOTE    Tonya Hogan  [id number] DOB: 01-10-1951 DOA: 07/19/2016 PCP: No PCP Per Patient   Brief Narrative:   65 y.o. BF PMHx  Bipolar DO, TIA, Obesity , HTN hypertension,, COPD on chronic home oxygen at night, CAD native artery status post CABG, Chronic Systolic CHF, Atrial Flutter S/P cardioversion on 8/25, Hypothyroidism  she was discharged from the hospital on 8/27 after TIA work up and treating aflutter /RVR, she  presents to [facility] with respiratory failure, she is found to be hypoxic in the 80's. cxr with bilateral infiltrate pulmonary edema vs pna, she does has leukocytosis, lactic acidosis, initial on presentation to ED she had brief hypotension required fluids boluses, she was put on bipap, hospitalist called who accepted the patient to be transferred to [facility] cone stepdown unit. She also reported chest tightness, [facility] EDP called cardiology as well. She was given vanc/zosyn and transferred to [place]. Patient currently report feeling better, denies fever, does report cough, more at night, currently chest pain free, able to talk in full sentences on 5 liter oxygen, she does report lower extremity edema.   Subjective: Feeling better. Breathing better.  Report blood in the stool last night after having hard stool BM. No further blood since last night. She has had problem in the past with hemorrhoids.    Assessment & Plan:   Active Problems:   Respiratory failure (HCC)   Hypoxia    Acute on Chronic Hypoxic Respiratory Failure: Uses 2 L oxygen at home  - initially required BiPAP. Better. -Most likely secondary to CHF. PCXR and patient's symptoms like consistent with infection. Stop antibiotics   Acute on Chronic Systolic CHF:  bnp elevated from basline. Troponin negative, ekg no acute changes, continue betablocker, hold acei for now due to tendency of hypotension, start iv lasix. Cardiology consulted. -Strict I&O since admission -8.8 L -Daily weight Filed Weights     07/19/16 1824 07/21/16 0454 07/22/16 0402  Weight: 128.5 kg (283 lb 4.7 oz) 121.1 kg (267 lb) 121.9 kg (268 lb 11.2 oz)  -Continue amiodarone 200 mg BID -Patient's states base weight 260 pounds.  -On Oxygen 4 L. Off BIPAP this am.  -Hypotension overnight, will ordered BP parameters for lasix.   A-flutter/TIA,  - continue betablocker/amiodarone/. Cardiology following -will discussed with Dr Ron Parker, regarding anticoagulation in setting of possible hemorrhoids bleed.   Probably hemorrhoids bleed: Continue with Miralax.  Report blood in the stool after having hard, BM.  Will order sitz bath, hydrocortisone suppository.  Cycle hemoglobin.   CAD native artery s/p cabg:  -continue betablocker, statin, hold asa. Currently chest pain free, troponin negative, ekg no acute changes.  HTN:  -See CHF  Morbid Obesity:  -Body mass index is 55.33 kg/m. life style modification.  Psych:  -stable , continue home meds depakote/risperdal    DVT prophylaxis: Xarelto Code Status: Full Family Communication: None Disposition Plan: Resolution CHF exacerbation, home    Consultants:  Dr.Jonathan J Gwenlyn [facility]    Procedures/Significant Events:  None   Cultures 8/22 MRSA by PCR positive 8/28 blood left forearm/right arm NGTD 8/28 urine pending 8/28 MRSA by PCR negative   Antimicrobials: Zosyn 8/28>> 8/29 Vancomycin 8/28>> 8/29   Devices    LINES / TUBES:      Continuous Infusions:    Objective: Vitals:   07/22/16 0151 07/22/16 0220 07/22/16 0402 07/22/16 0601  BP: (!) 83/32 (!) 83/38 (!) 102/50 (!) 104/52  Pulse: 61 67 64 67  Resp: 15 (!) 21 17  15  Temp:   97.8 F (36.6 C)   TempSrc:   Oral   SpO2: 96% 96% 94%   Weight:   121.9 kg (268 lb 11.2 oz)   Height:   5' (1.524 m)     Intake/Output Summary (Last 24 hours) at 07/22/16 0825 Last data filed at 07/22/16 0423  Gross per 24 hour  Intake                0 ml  Output              400 ml  Net              -400 ml   Filed Weights   07/19/16 1824 07/21/16 0454 07/22/16 0402  Weight: 128.5 kg (283 lb 4.7 oz) 121.1 kg (267 lb) 121.9 kg (268 lb 11.2 oz)    Examination:  General: A/O 4, positive acute on chronic respiratory distress Eyes: negative scleral hemorrhage, negative anisocoria, negative icterus ENT: Negative Runny nose, negative gingival bleeding, Neck:  Negative scars, masses, torticollis, lymphadenopathy, JVD Lungs: bilateral air movement, no wheezing.  Cardiovascular: Regular rate and rhythm without murmur gallop or rub normal S1 and S2 Abdomen: MORBID OBESITY, negative abdominal pain, nondistended, positive soft, bowel sounds, no rebound, no ascites, no appreciable mass Extremities: trace edema Central nervous system: speech more clear.  .   CBC:  Recent Labs Lab 07/18/16 0313 07/19/16 1240 07/20/16 0347 07/21/16 1000 07/22/16 0147 07/22/16 0627  WBC 7.4 13.5* 10.7* 7.5  --  6.3  NEUTROABS  --  11.0*  --   --   --   --   HGB 8.4* 8.4* 7.8* 8.5* 8.0* 8.7*  HCT 29.7* 28.9* 28.3* 30.4* 28.9* 30.9*  MCV 86.1 85.3 86.8 86.9  --  86.6  PLT 253 262 238 240  --  [id number]   Basic Metabolic Panel:  Recent Labs Lab 07/18/16 0313 07/19/16 1240 07/20/16 0347 07/21/16 1000 07/22/16 0146  NA 140 139 142 140 140  K 4.4 4.0 3.8 3.7 3.6  CL 100* 101 100* 92* 92*  CO2 34* 30 38* 38* 41*  GLUCOSE 81 109* 102* 170* 75  BUN 20 22* 13 13 18   CREATININE 1.03* 0.87 0.86 0.89 0.91  CALCIUM 8.8* 8.5* 8.3* 8.5* 8.3*  MG  --   --  2.0  --   --    GFR: Estimated Creatinine Clearance: 75 mL/min (by C-G formula based on SCr of 0.91 mg/dL). Liver Function Tests:  Recent Labs Lab 07/19/16 1240  AST 14*  ALT 10*  ALKPHOS 53  BILITOT 0.1*  PROT 7.5  ALBUMIN 2.9*   No results for input(s): LIPASE, AMYLASE in the last 168 hours. No results for input(s): AMMONIA in the last 168 hours. Coagulation Profile:  Recent Labs Lab 07/19/16 1240  INR 1.10   Cardiac  Enzymes:  Recent Labs Lab 07/19/16 1250  TROPONINI <0.03   BNP (last 3 results) No results for input(s): PROBNP in the last 8760 hours. HbA1C: No results for input(s): HGBA1C in the last 72 hours. CBG: No results for input(s): GLUCAP in the last 168 hours. Lipid Profile: No results for input(s): CHOL, HDL, LDLCALC, TRIG, CHOLHDL, LDLDIRECT in the last 72 hours. Thyroid Function Tests: No results for input(s): TSH, T4TOTAL, FREET4, T3FREE, THYROIDAB in the last 72 hours. Anemia Panel: No results for input(s): VITAMINB12, FOLATE, FERRITIN, TIBC, IRON, RETICCTPCT in the last 72 hours. Urine analysis:    Component Value Date/Time   COLORURINE YELLOW 07/19/2016  [place] 07/19/2016 1350   LABSPEC 1.022 07/19/2016 1350   PHURINE 6.0 07/19/2016 1350   GLUCOSEU NEGATIVE 07/19/2016 1350   HGBUR NEGATIVE 07/19/2016 [place] 07/19/2016 1350   KETONESUR NEGATIVE 07/19/2016 1350   PROTEINUR NEGATIVE 07/19/2016 1350   UROBILINOGEN 1.0 10/06/2015 2010   NITRITE NEGATIVE 07/19/2016 1350   LEUKOCYTESUR NEGATIVE 07/19/2016 1350   Sepsis Labs: @LABRCNTIP (procalcitonin:4,lacticidven:4)  ) Recent Results (from the past 240 hour(s))  MRSA PCR Screening     Status: Abnormal   Collection Time: 07/13/16  6:46 PM  Result Value Ref Range Status   MRSA by PCR POSITIVE (A) NEGATIVE Final    Comment:        The GeneXpert MRSA Assay (FDA approved for NASAL specimens only), is one component of a comprehensive MRSA colonization surveillance program. It is not intended to diagnose MRSA infection nor to guide or monitor treatment for MRSA infections. RESULT CALLED TO, READ BACK BY AND VERIFIED WITH: T IRBY RN [id number] 07/13/16 A BROWNING   Blood Culture (routine x 2)     Status: None (Preliminary result)   Collection Time: 07/19/16  1:10 PM  Result Value Ref Range Status   Specimen Description BLOOD LEFT FOREARM  Final   Special Requests BOTTLES DRAWN AEROBIC  AND ANAEROBIC 5CC EACH  Final   Culture   Final    NO GROWTH 2 DAYS Performed at [facility]    Report Status PENDING  Incomplete  Blood Culture (routine x 2)     Status: None (Preliminary result)   Collection Time: 07/19/16  1:40 PM  Result Value Ref Range Status   Specimen Description BLOOD RIGHT ARM  Final   Special Requests BOTTLES DRAWN AEROBIC AND ANAEROBIC 5CC EACH  Final   Culture   Final    NO GROWTH 2 DAYS Performed at [facility]    Report Status PENDING  Incomplete  MRSA PCR Screening     Status: None   Collection Time: 07/19/16  6:23 PM  Result Value Ref Range Status   MRSA by PCR NEGATIVE NEGATIVE Final    Comment:        The GeneXpert MRSA Assay (FDA approved for NASAL specimens only), is one component of a comprehensive MRSA colonization surveillance program. It is not intended to diagnose MRSA infection nor to guide or monitor treatment for MRSA infections.          Radiology Studies: No results found.      Scheduled Meds: . amiodarone  200 mg Oral BID  . atorvastatin  20 mg Oral QHS  . divalproex  1,000 mg Oral BID  . ferrous sulfate  325 mg Oral Q breakfast  . gabapentin  600 mg Oral BID  . hydrocortisone  25 mg Rectal BID  . levothyroxine  125 mcg Oral QAC breakfast  . polyethylene glycol  17 g Oral Daily  . risperiDONE  2 mg Oral QHS  . senna  1 tablet Oral Daily   Continuous Infusions:    LOS: 3 days    Time spent: 35 minutes    Elmarie Shiley, MD [facility] Pager [phone]  If 7PM-7AM, please contact night-coverage [email or web address] Password [email or web address] 07/22/2016, 8:25 AM

## 2016-07-22 NOTE — Progress Notes (Signed)
Pt on 2L [place], changed to regular [place], sat 100%.

## 2016-07-22 NOTE — Progress Notes (Signed)
Patient Name:  Tonya Hogan, DOB: [birth year], MRN: [id number] Primary Doctor: No PCP Per Patient Primary Cardiologist:   Date: 07/22/2016   SUBJECTIVE:  The patient continues to diuresis respiratory status is improving. She is holding sinus rhythm.   Past Medical History:  Diagnosis Date  . Arthritis   . Bipolar 1 disorder ([place])   . CAD (coronary artery disease)   . CHF (congestive heart failure) ([place])   . Depression   . Hypertension   . Hypothyroidism   . Obesity   . S/P CABG x 3   . Thyroid ca ([place])   . Thyroid disease    Vitals:   07/22/16 0220 07/22/16 0402 07/22/16 0601 07/22/16 0847  BP: (!) 83/38 (!) 102/50 (!) 104/52 (!) 118/44  Pulse: 67 64 67 65  Resp: (!) 21 17 15    Temp:  97.8 F (36.6 C)  98.3 F (36.8 C)  TempSrc:  Oral  Oral  SpO2: 96% 94%  100%  Weight:  268 lb 11.2 oz (121.9 kg)    Height:  5' (1.524 m)      Intake/Output Summary (Last 24 hours) at 07/22/16 1153 Last data filed at 07/22/16 1100  Gross per 24 hour  Intake              380 ml  Output             1400 ml  Net            -1020 ml   Filed Weights   07/19/16 1824 07/21/16 0454 07/22/16 0402  Weight: 283 lb 4.7 oz (128.5 kg) 267 lb (121.1 kg) 268 lb 11.2 oz (121.9 kg)     LABS: Basic Metabolic Panel:  Recent Labs  07/20/16 0347 07/21/16 1000 07/22/16 0146  NA 142 140 140  K 3.8 3.7 3.6  CL 100* 92* 92*  CO2 38* 38* 41*  GLUCOSE 102* 170* 75  BUN 13 13 18   CREATININE 0.86 0.89 0.91  CALCIUM 8.3* 8.5* 8.3*  MG 2.0  --   --    Liver Function Tests:  Recent Labs  07/19/16 1240  AST 14*  ALT 10*  ALKPHOS 53  BILITOT 0.1*  PROT 7.5  ALBUMIN 2.9*   No results for input(s): LIPASE, AMYLASE in the last 72 hours. CBC:  Recent Labs  07/19/16 1240  07/21/16 1000 07/22/16 0147 07/22/16 0627  WBC 13.5*  < > 7.5  --  6.3  NEUTROABS 11.0*  --   --   --   --   HGB 8.4*  < > 8.5* 8.0* 8.7*  HCT 28.9*  < > 30.4* 28.9* 30.9*  MCV 85.3  < > 86.9  --  86.6    PLT 262  < > 240  --  250  < > = values in this interval not displayed. Cardiac Enzymes:  Recent Labs  07/19/16 1250  TROPONINI <0.03   BNP: Invalid input(s): POCBNP D-Dimer: No results for input(s): DDIMER in the last 72 hours. Thyroid Function Tests: No results for input(s): TSH, T4TOTAL, T3FREE, THYROIDAB in the last 72 hours.  Invalid input(s): FREET3  RADIOLOGY: Dg Chest 2 View  Result Date: 07/13/2016 CLINICAL DATA:  Slurred speech. EXAM: CHEST  2 VIEW COMPARISON:  Radiographs of April 30, 2016. FINDINGS: Stable cardiomediastinal silhouette. Sternotomy wires are noted. No pneumothorax or pleural effusion is noted. Bony thorax is unremarkable. Slightly improved linear densities are noted in the perihilar regions consistent with improving subsegmental atelectasis.  IMPRESSION: Mildly improved bilateral parahilar subsegmental atelectasis. Electronically Signed   By: Marijo Conception, M.D.   On: 07/13/2016 14:56   Dg Chest 2 View  Result Date: 06/30/2016 CLINICAL DATA:  Initial evaluation for medical clearance. Mental health admission. EXAM: CHEST  2 VIEW COMPARISON:  Prior radiograph from 04/07/2016. FINDINGS: Median sternotomy wires noted, stable. Mild cardiomegaly is unchanged. Mediastinal silhouette within normal limits. Atheromatous plaque within the aortic arch. Lungs are normally inflated. Mild perihilar vascular congestion without pulmonary edema. No pleural effusion. The scarring at the mid left lung again noted, similar to prior. Streaky right perihilar opacities also compatible with scarring and/or atelectasis. No consolidative airspace disease. No pneumothorax. Surgical clips overlie the lower neck. No acute osseous abnormality. IMPRESSION: 1. Mild perihilar vascular congestion without overt pulmonary edema. 2. Scarring at the left mid and lower lung, similar to prior. Streaky right perihilar opacities also compatible with atelectasis and/or scarring. 3. Aortic atherosclerosis.  Electronically Signed   By: Jeannine Boga M.D.   On: 06/30/2016 20:48   Ct Head Wo Contrast  Result Date: 07/13/2016 CLINICAL DATA:  Slurred speech beginning last night. EXAM: CT HEAD WITHOUT CONTRAST TECHNIQUE: Contiguous axial images were obtained from the base of the skull through the vertex without intravenous contrast. COMPARISON:  MRI 10/07/2015 FINDINGS: Brain: No acute intracranial abnormality. Specifically, no hemorrhage, hydrocephalus, mass lesion, acute infarction, or significant intracranial injury. Vascular: No hyperdense vessel or unexpected calcification. Skull: No acute calvarial abnormality Sinuses/Orbits: Visualized paranasal sinuses and mastoids clear. Orbital soft tissues unremarkable. Other: None IMPRESSION: No acute intracranial abnormality. Electronically Signed   By: Rolm Baptise M.D.   On: 07/13/2016 14:45   Ct Angio Chest Pe W And/or Wo Contrast  Result Date: 07/13/2016 CLINICAL DATA:  Tachycardia, shoulder pain. EXAM: CT ANGIOGRAPHY CHEST WITH CONTRAST TECHNIQUE: Multidetector CT imaging of the chest was performed using the standard protocol during bolus administration of intravenous contrast. Multiplanar CT image reconstructions and MIPs were obtained to evaluate the vascular anatomy. CONTRAST:  100 mL of Isovue 370 intravenously. COMPARISON:  CT scan of June 22, 2015. FINDINGS: No pneumothorax or pleural effusion is noted. Mild bibasilar subsegmental atelectasis is noted. There is no evidence of large central pulmonary embolus. However, due to limitation in terms of timing of contrast bolus, smaller peripheral emboli in lower lobe branches cannot be excluded on the basis of this exam. Status post coronary artery bypass graft. Atherosclerosis of thoracic aorta is noted without aneurysm or dissection. Visualized portion of upper abdomen is unremarkable. No mediastinal mass or adenopathy is noted. Review of the MIP images confirms the above findings. IMPRESSION: Aortic  atherosclerosis. Mild bibasilar subsegmental atelectasis. No evidence of large central pulmonary embolus. However, due to limitations of contrast bolus, smaller peripheral emboli in lower lobe branches cannot be excluded on the basis of this exam. Electronically Signed   By: Marijo Conception, M.D.   On: 07/13/2016 17:11   Mr Brain Wo Contrast  Result Date: 07/14/2016 CLINICAL DATA:  65 y/o F; presenting with right facial droop and slurred speech beginning 4 days ago. EXAM: MRI HEAD WITHOUT CONTRAST MRA HEAD WITHOUT CONTRAST TECHNIQUE: Multiplanar, multiecho pulse sequences of the brain and surrounding structures were obtained without intravenous contrast. Angiographic images of the head were obtained using MRA technique without contrast. COMPARISON:  CT of head dated 07/13/2016.  MRI brain 10/07/2015. FINDINGS: MRI HEAD FINDINGS Brain: No diffusion restriction to suggest acute infarct. There are punctate foci of susceptibility hypointensity within the cerebellum and bilateral  frontal and parietal subcortical white matter without significant interval change compatible with prior microhemorrhage. Motion artifact partially degrades the FLAIR sequence. 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 The left parietal and temporal infarcts have mild foraminal 7 staining. No focal mass effect. Mild parenchymal atrophy. Extra-axial space: No hydrocephalus. No midline shift. No effacement of basilar cisterns. No extra-axial collection is identified. Proximal intracranial flow voids are maintained. No abnormality of the cervical medullary junction. Other: No abnormal signal of the paranasal sinuses. Partial opacification of right mastoid air cells. Orbits are unremarkable. Calvarium is unremarkable. MRA HEAD FINDINGS Internal carotid arteries: Broad base posteriorly directed aneurysm of proximal right cavernous segment measuring 4 mm at the base and 5 mm  to dome, series 5, image 111. Anterior cerebral arteries:  Patent. Middle cerebral arteries: Patent. Anterior communicating artery: Patent. Posterior communicating arteries:  Patent. Posterior cerebral arteries: Patent bilaterally. Small left P1 segment with large left posterior communicating artery consistent with fetal circulation, normal variant. Basilar artery:  Patent. Vertebral arteries:  Patent.  Left dominant. No evidence of significant stenosis or large vessel occlusion. IMPRESSION: 1. No evidence of acute/early subacute infarct or new intracranial hemorrhage. 2. 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 3. Stable parenchymal volume loss and chronic infarcts within left temporal, left parietal, and right cerebellum. 4. No significant stenosis or large vessel occlusion of the circle of Willis. These results will be called to the ordering clinician or representative by the Radiologist Assistant, and communication documented in the PACS or zVision Dashboard. Electronically Signed   By: Kristine Garbe M.D.   On: 07/14/2016 01:58   Dg Chest Port 1 View  Result Date: 07/19/2016 CLINICAL DATA:  Shortness of breath. EXAM: PORTABLE CHEST 1 VIEW COMPARISON:  07/13/2016 FINDINGS: Prior CABG. Mild cardiomegaly with vascular congestion. Low lung volumes. Linear areas of scarring or atelectasis in the lungs bilaterally. No effusions. No acute bony abnormality. IMPRESSION: Areas of mid lung atelectasis or scarring. Cardiomegaly, vascular congestion. Low lung volumes. Electronically Signed   By: Rolm Baptise M.D.   On: 07/19/2016 13:19   Mr Jodene Nam Head/brain [id number] Cm  Result Date: 07/14/2016 CLINICAL DATA:  65 y/o F; presenting with right facial droop and slurred speech beginning 4 days ago. EXAM: MRI HEAD WITHOUT CONTRAST MRA HEAD WITHOUT CONTRAST TECHNIQUE: Multiplanar, multiecho pulse sequences of the brain and surrounding structures were obtained without  intravenous contrast. Angiographic images of the head were obtained using MRA technique without contrast. COMPARISON:  CT of head dated 07/13/2016.  MRI brain 10/07/2015. FINDINGS: MRI HEAD FINDINGS Brain: No diffusion restriction to suggest acute infarct. There are punctate foci of susceptibility hypointensity within the cerebellum and bilateral frontal and parietal subcortical white matter without significant interval change compatible with prior microhemorrhage. Motion artifact partially degrades the FLAIR sequence. 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 The left parietal and temporal infarcts have mild foraminal 7 staining. No focal mass effect. Mild parenchymal atrophy. Extra-axial space: No hydrocephalus. No midline shift. No effacement of basilar cisterns. No extra-axial collection is identified. Proximal intracranial flow voids are maintained. No abnormality of the cervical medullary junction. Other: No abnormal signal of the paranasal sinuses. Partial opacification of right mastoid air cells. Orbits are unremarkable. Calvarium is unremarkable. MRA HEAD FINDINGS Internal carotid arteries: Broad base posteriorly directed aneurysm of proximal right cavernous segment measuring 4 mm at the base and  5 mm to dome, series 5, image 111. Anterior cerebral arteries:  Patent. Middle cerebral arteries: Patent. Anterior communicating artery: Patent. Posterior communicating arteries:  Patent. Posterior cerebral arteries: Patent bilaterally. Small left P1 segment with large left posterior communicating artery consistent with fetal circulation, normal variant. Basilar artery:  Patent. Vertebral arteries:  Patent.  Left dominant. No evidence of significant stenosis or large vessel occlusion. IMPRESSION: 1. No evidence of acute/early subacute infarct or new intracranial hemorrhage. 2. 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 3. Stable parenchymal volume loss and chronic infarcts within left temporal, left parietal, and right cerebellum. 4. No significant stenosis or large vessel occlusion of the circle of Willis. These results will be called to the ordering clinician or representative by the Radiologist Assistant, and communication documented in the PACS or zVision Dashboard. Electronically Signed   By: Kristine Garbe M.D.   On: 07/14/2016 01:58    PHYSICAL EXAM patient is resting. Lungs reveal scattered rhonchi. Cardiac exam reveals S1 and S2. There is trace peripheral edema.  I have reviewed telemetry today July 22, 2016. There is normal sinus rhythm.   ASSESSMENT AND PLAN:    Facial droop     The patient had a recent facial droop that is improved. It was thought that this was related to the atrial flutter. The patient's TEE cardioversion done very recently revealed no obvious atrial clot. However with a history of a neurologic event, we would still want to do everything possible to continue her anticoagulation at this time if possible.    Atrial flutter ([place])     She is holding sinus rhythm.    Respiratory failure ([place])      This is improving with diuresis.    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 The patient did not have coronary bypass surgery at that time.    Anemia     This is a very key issue. I am hopeful that this situation can be stabilized without stopping her anticoagulation. The patient had been on aspirin along with Xarelto. The aspirin has been stopped and I agree with this.    PFO (patent foramen ovale)      The patient had a PFO repaired at the time that the atrial myxoma was removed at [facility]. The patient did not have coronary bypass surgery.    HX: anticoagulation      The patient needs anticoagulation. There was a very recent TEE cardioversion. At that time the TEE showed no recurrent myxoma. There was  no atrial appendage clot. However the patient did have some facial drooping recently. We need to do everything possible to continue her anticoagulation if possible.  The overall plan will be to treat the patient's volume status and stabilize her respiratory status. It is crucial to be sure that her anemia has stabilized. It would be good if this turns out that she had hemorrhoidal bleeding and not other sources of bleeding. We want to continue anticoagulation if it is possible.   Dola Argyle 07/22/2016 11:53 AM

## 2016-07-22 NOTE — NC FL2 (Signed)
[place] MEDICAID FL2 LEVEL OF CARE SCREENING TOOL     IDENTIFICATION  Patient Name: Tonya Hogan Birthdate: 06-14-1951 Sex: female Admission Date (Current Location): 07/19/2016  [facility] and [place] Number:  Herbalist and Address:  [facility], [place] [address] [place], [place] [address]      Provider Number: [id number]  Attending Physician Name and Address:  Elmarie Shiley, MD  Relative Name and Phone Number:       Current Level of Care: Hospital Recommended Level of Care: [place] Prior Approval Number:    Date Approved/Denied:   PASRR Number:    Discharge Plan: SNF    Current Diagnoses: Patient Active Problem List   Diagnosis Date Noted  . 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 Anemia 07/22/2016  . PFO (patent foramen ovale) 07/22/2016  . HX: anticoagulation 07/22/2016  . Respiratory failure ([place]) 07/19/2016  . Hypoxia 07/19/2016  . Chronic respiratory failure with hypoxia ([place])   . Cerebral thrombosis with cerebral infarction 07/14/2016  . Transient cerebral ischemia   . Morbid obesity ([place])   . Acute on chronic systolic CHF (congestive heart failure) ([place])   . Atrial tachycardia ([place]) 07/13/2016  . Atrial flutter ([place]) 07/13/2016  . Slurred speech 07/13/2016  . Chronic diastolic heart failure ([place]) 11/05/2015  . Facial droop 10/06/2015  . Acute on chronic diastolic heart failure ([place]) 10/06/2015  . Acute on chronic respiratory failure with hypoxia ([place]) 10/06/2015  . CHF exacerbation ([place]) 10/06/2015  . Hypothyroidism   . CAD in native artery   . Bipolar affective disorder, manic ([place]) 07/08/2015  . COPD (chronic obstructive pulmonary disease) ([place]) 06/26/2015  . Acute pulmonary edema ([place]) 06/24/2015  . Essential hypertension 06/23/2015  . Chronic anemia 06/23/2015  . Post-operative hypothyroidism 06/23/2015  . Shortness of breath 06/23/2015  . Pulmonary edema 06/22/2015    Orientation RESPIRATION  BLADDER Height & Weight     Self, Time, Situation, Place  O2 (HFNC 3L/ min) Continent Weight: 268 lb 11.2 oz (121.9 kg) Height:  5' (152.4 cm)  BEHAVIORAL SYMPTOMS/MOOD NEUROLOGICAL BOWEL NUTRITION STATUS   (none)  (none) Continent  (Clear Liquids)  AMBULATORY STATUS COMMUNICATION OF NEEDS Skin   Limited Assist Verbally Normal                       Personal Care Assistance Level of Assistance  Bathing, Feeding, Dressing Bathing Assistance: Limited assistance Feeding assistance: Independent Dressing Assistance: Limited assistance     Functional Limitations Info  Sight, Hearing, Speech Sight Info: Adequate Hearing Info: Adequate Speech Info: Adequate    SPECIAL CARE FACTORS FREQUENCY  PT (By licensed PT), OT (By licensed OT)     PT Frequency: 5/ week OT Frequency: 5/ week            Contractures Contractures Info: Not present    Additional Factors Info  Code Status, Psychotropic, Isolation Precautions, Allergies Code Status Info: Full Allergies Info: Methimazole, Propylthiouracil, Chocolate, Other, Peanut-containing Drug Products, Penicillins, Vicodin Hydrocodone-acetaminophen Psychotropic Info: gabapentin (NEURONTIN) tablet 600 mg Dose: 600 mg Freq: 2 times daily Route: PO, divalproex (DEPAKOTE ER) 24 hr tablet 1,000 mg Dose: 1,000 mg Freq: 2 times daily Route: PO, risperiDONE (RISPERDAL) tablet 2 mg Dose: 2 mg Freq: Daily at bedtime Route: PO   Isolation Precautions Info: MRSA     Current Medications (07/22/2016):  This is the current hospital active medication list Current Facility-Administered Medications  Medication Dose Route Frequency Provider Last Rate  Last Dose  . albuterol (PROVENTIL) (2.5 MG/3ML) 0.083% nebulizer solution 3 mL  3 mL Inhalation Q6H PRN Florencia Reasons, MD      . amiodarone (PACERONE) tablet 200 mg  200 mg Oral BID Florencia Reasons, MD   200 mg at 07/22/16 0757  . atorvastatin (LIPITOR) tablet 20 mg  20 mg Oral QHS Florencia Reasons, MD   20 mg at 07/21/16 2301  .  bisacodyl (DULCOLAX) EC tablet 5 mg  5 mg Oral BID PRN Florencia Reasons, MD      . divalproex (DEPAKOTE ER) 24 hr tablet 1,000 mg  1,000 mg Oral BID Florencia Reasons, MD   1,000 mg at 07/22/16 0756  . ferrous sulfate tablet 325 mg  325 mg Oral Q breakfast Florencia Reasons, MD   325 mg at 07/22/16 0756  . furosemide (LASIX) tablet 40 mg  40 mg Oral BID Belkys A Regalado, MD   40 mg at 07/22/16 0856  . gabapentin (NEURONTIN) tablet 600 mg  600 mg Oral BID Florencia Reasons, MD   600 mg at 07/22/16 0757  . hydrocortisone (ANUSOL-HC) suppository 25 mg  25 mg Rectal BID Belkys A Regalado, MD   25 mg at 07/22/16 0857  . levothyroxine (SYNTHROID, LEVOTHROID) tablet 125 mcg  125 mcg Oral QAC breakfast Florencia Reasons, MD   125 mcg at 07/22/16 0756  . polyethylene glycol (MIRALAX / GLYCOLAX) packet 17 g  17 g Oral Daily Belkys A Regalado, MD   17 g at 07/22/16 0756  . risperiDONE (RISPERDAL) tablet 2 mg  2 mg Oral QHS Florencia Reasons, MD   2 mg at 07/21/16 2302  . rivaroxaban (XARELTO) tablet 20 mg  20 mg Oral Q supper Belkys A Regalado, MD      . senna (SENOKOT) tablet 8.6 mg  1 tablet Oral Daily Belkys A Regalado, MD   8.6 mg at 07/22/16 1000  . zolpidem (AMBIEN) tablet 5 mg  5 mg Oral QHS PRN Florencia Reasons, MD   5 mg at 07/19/16 2321     Discharge Medications: Please see discharge summary for a list of discharge medications.  Relevant Imaging Results:  Relevant Lab Results:   Additional Information [id number]  Samule Dry, LCSW

## 2016-07-22 NOTE — Progress Notes (Signed)
ANTICOAGULATION CONSULT NOTE - Follow Up Consult  Pharmacy Consult for Xarelto Indication: atrial fibrillation  Allergies  Allergen Reactions  . Methimazole Hives and Rash  . Propylthiouracil Hives  . Chocolate Other (See Comments)    Sneezing, coughing  . Other Other (See Comments)    Corn, cabbage, greens cause Rectal bleeding  . Peanut-Containing Drug Products Other (See Comments)    Sneezing,coughing  . Penicillins Itching, Other (See Comments) and Cough    Sneezing Has patient had a PCN reaction causing immediate rash, facial/tongue/throat swelling, SOB or lightheadedness with hypotension: Yes Has patient had a PCN reaction causing severe rash involving mucus membranes or skin necrosis: Unknown Has patient had a PCN reaction that required hospitalization: Unknown Has patient had a PCN reaction occurring within the last 10 years: No If all of the above answers are "NO", then may proceed with Cephalosporin use.   . Vicodin [Hydrocodone-Acetaminophen] Rash    Patient Measurements: Height: 5' (152.4 cm) Weight: 268 lb 11.2 oz (121.9 kg) IBW/kg (Calculated) : 45.5   Vital Signs: Temp: 97.5 F (36.4 C) (08/31 1255) Temp Source: Oral (08/31 1255) BP: 118/63 (08/31 1255) Pulse Rate: 65 (08/31 1255)  Labs:  Recent Labs  07/20/16 0347 07/21/16 1000 07/22/16 0146 07/22/16 0147 07/22/16 0627  HGB 7.8* 8.5*  --  8.0* 8.7*  HCT 28.3* 30.4*  --  28.9* 30.9*  PLT 238 240  --   --  250  CREATININE 0.86 0.89 0.91  --   --     Estimated Creatinine Clearance: 75 mL/min (by C-G formula based on SCr of 0.91 mg/dL).   Medications:  See EMR  Assessment: 33 YOF presents admitted with respiratory failure. Xarelto PTA for Afib/Aflutter. To resume therapy after temporary hold due to rectal bleeding. CBC low throughout admission but overall stable - Hgb 8.7, plt 250. No overt bleeding reported.   Plan:  -Continue Xarelto 20mg  daily as per PTA dose -Monitor CBC, s/sx  bleeding  Pharmacy will sign off for now but monitor peripherally  Stephens November, PharmD Clinical Pharmacist 1:17 PM, 07/22/2016

## 2016-07-22 NOTE — Clinical Social Work Placement (Signed)
   CLINICAL SOCIAL WORK PLACEMENT  NOTE  Date:  07/22/2016  Patient Details  Name: Cloa Bertz MRN: [id number] Date of Birth: [birth year]  Clinical Social Work is seeking post-discharge placement for this patient at the [place] level of care (*CSW will initial, date and re-position this form in  chart as items are completed):  Yes   Patient/family provided with [facility] Work Department's list of facilities offering this level of care within the geographic area requested by the patient (or if unable, by the patient's family).  Yes   Patient/family informed of their freedom to choose among providers that offer the needed level of care, that participate in Medicare, Medicaid or managed care program needed by the patient, have an available bed and are willing to accept the patient.  Yes   Patient/family informed of [facility]'s ownership interest in [facility] and [facility], as well as of the fact that they are under no obligation to receive care at these facilities.  PASRR submitted to EDS on 07/22/16     PASRR number received on       Existing PASRR number confirmed on       FL2 transmitted to all facilities in geographic area requested by pt/family on 07/22/16     FL2 transmitted to all facilities within larger geographic area on       Patient informed that his/her managed care company has contracts with or will negotiate with certain facilities, including the following:            Patient/family informed of bed offers received.  Patient chooses bed at       Physician recommends and patient chooses bed at      Patient to be transferred to   on  .  Patient to be transferred to facility by       Patient family notified on   of transfer.  Name of family member notified:        PHYSICIAN Please sign FL2     Additional Comment:   Barbette Or, [place]

## 2016-07-23 DIAGNOSIS — Z7901 Long term (current) use of anticoagulants: Secondary | ICD-10-CM

## 2016-07-23 LAB — CBC
HCT: 32.5 % — ABNORMAL LOW (ref 36.0–46.0)
HEMOGLOBIN: 9.1 g/dL — AB (ref 12.0–15.0)
MCH: 24.1 pg — ABNORMAL LOW (ref 26.0–34.0)
MCHC: 28 g/dL — ABNORMAL LOW (ref 30.0–36.0)
MCV: 86.2 fL (ref 78.0–100.0)
Platelets: 266 10*3/uL (ref 150–400)
RBC: 3.77 MIL/uL — AB (ref 3.87–5.11)
RDW: 17 % — ABNORMAL HIGH (ref 11.5–15.5)
WBC: 6.3 10*3/uL (ref 4.0–10.5)

## 2016-07-23 LAB — CBC AND DIFFERENTIAL
HEMATOCRIT: 30 % — AB (ref 36–46)
HEMATOCRIT: 33 % — AB (ref 36–46)
HEMOGLOBIN: 8.5 g/dL — AB (ref 12.0–16.0)
Hemoglobin: 9.1 g/dL — AB (ref 12.0–16.0)
PLATELETS: 266 10*3/uL (ref 150–399)
WBC: 6.3 10*3/mL

## 2016-07-23 LAB — BASIC METABOLIC PANEL
Anion gap: 6 (ref 5–15)
BUN: 7 mg/dL (ref 4–21)
BUN: 7 mg/dL (ref 6–20)
CHLORIDE: 92 mmol/L — AB (ref 101–111)
CO2: 42 mmol/L — ABNORMAL HIGH (ref 22–32)
CREATININE: 0.92 mg/dL (ref 0.44–1.00)
Calcium: 8.7 mg/dL — ABNORMAL LOW (ref 8.9–10.3)
Creatinine: 0.9 mg/dL (ref 0.5–1.1)
GLUCOSE: 108 mg/dL
Glucose, Bld: 108 mg/dL — ABNORMAL HIGH (ref 65–99)
POTASSIUM: 3.7 mmol/L (ref 3.4–5.3)
POTASSIUM: 3.7 mmol/L (ref 3.5–5.1)
SODIUM: 140 mmol/L (ref 135–145)
Sodium: 140 mmol/L (ref 137–147)

## 2016-07-23 LAB — HEMOGLOBIN AND HEMATOCRIT, BLOOD
HEMATOCRIT: 29.7 % — AB (ref 36.0–46.0)
Hemoglobin: 8.5 g/dL — ABNORMAL LOW (ref 12.0–15.0)

## 2016-07-23 LAB — PROCALCITONIN

## 2016-07-23 MED ORDER — ACETAMINOPHEN 325 MG PO TABS
650.0000 mg | ORAL_TABLET | Freq: Four times a day (QID) | ORAL | Status: DC | PRN
Start: 2016-07-23 — End: 2016-07-24

## 2016-07-23 MED ORDER — TRAMADOL HCL 50 MG PO TABS: 50.0000 mg | ORAL_TABLET | Freq: Three times a day (TID) | ORAL | Status: DC | PRN

## 2016-07-23 NOTE — Progress Notes (Addendum)
Patient Name: Tonya Hogan Date of Encounter: 07/23/2016  Active Problems:   Facial droop   Atrial flutter (HCC)   Respiratory failure (HCC)   Hypoxia   Atrial myxoma   Anemia   PFO (patent foramen ovale)   HX: anticoagulation   Primary Cardiologist: CHF clinic, Darrick Grinder, NP Patient Profile: morbidly obese 65 yo AA female hx HTN, bipolar disorder, h/o noncompliance, COPD, chronic anemia, chronic diastolic heart failure, thyroid CA --> hypothyroid, s/p OHS for myxoma with PFO closure on 01/31/2010 at [facility] presented with AMS and acute hypoxia 08/28 after TEE cardioversion 08/25  SUBJECTIVE: Pt feels well, home O2 is 2lpm, here she is on 5. Breathing well, no chest pain or palps  OBJECTIVE Vitals:   07/23/16 0338 07/23/16 0351 07/23/16 0744 07/23/16 0800  BP:  (!) 99/50 124/81 124/81  Pulse:  64  74  Resp:  18  (!) 24  Temp:  98.2 F (36.8 C)  97.4 F (36.3 C)  TempSrc:  Oral  Oral  SpO2:  99%  98%  Weight: 269 lb 4.8 oz (122.2 kg)     Height:        Intake/Output Summary (Last 24 hours) at 07/23/16 [id number] Last data filed at 07/23/16 0600  Gross per 24 hour  Intake             1380 ml  Output             2000 ml  Net             -620 ml   Filed Weights   07/21/16 0454 07/22/16 0402 07/23/16 0338  Weight: 267 lb (121.1 kg) 268 lb 11.2 oz (121.9 kg) 269 lb 4.8 oz (122.2 kg)    PHYSICAL EXAM General: Well developed, well nourished, female in no acute distress. Head: Normocephalic, atraumatic.  Neck: Supple without bruits, JVD not well seen 2nd body habitus. Lungs:  Resp regular and unlabored, CTA. Heart: RRR, S1, S2, no S3, S4, or murmur; no rub. Abdomen: Soft, non-tender, non-distended, BS + x 4.  Extremities: No clubbing, cyanosis, edema.  Neuro: Alert and oriented X 3. Moves all extremities spontaneously. Psych: Normal affect.  LABS: CBC: Recent Labs  07/21/16 1000  07/22/16 0627 07/22/16 1706 07/23/16 0017  WBC 7.5  --  6.3  --   --     HGB 8.5*  < > 8.7* 8.9* 8.5*  HCT 30.4*  < > 30.9* 31.8* 29.7*  MCV 86.9  --  86.6  --   --   PLT 240  --  250  --   --   < > = values in this interval not displayed.  Basic Metabolic Panel: Recent Labs  07/21/16 1000 07/22/16 0146  NA 140 140  K 3.7 3.6  CL 92* 92*  CO2 38* 41*  GLUCOSE 170* 75  BUN 13 18  CREATININE 0.89 0.91  CALCIUM 8.5* 8.3*   BNP:  B Natriuretic Peptide  Date/Time Value Ref Range Status  07/19/2016 12:50 PM 348.4 (H) 0.0 - 100.0 pg/mL Final  07/13/2016 01:35 PM 328.4 (H) 0.0 - 100.0 pg/mL Final   TELE:   SR     Dg Chest Port 1 View Result Date: 07/19/2016 CLINICAL DATA:  Shortness of breath. EXAM: PORTABLE CHEST 1 VIEW COMPARISON:  07/13/2016 FINDINGS: Prior CABG. Mild cardiomegaly with vascular congestion. Low lung volumes. Linear areas of scarring or atelectasis in the lungs bilaterally. No effusions. No acute bony abnormality. IMPRESSION: Areas of  mid lung atelectasis or scarring. Cardiomegaly, vascular congestion. Low lung volumes. Electronically Signed   By: Rolm Baptise M.D.   On: 07/19/2016 13:19    Current Medications:  . amiodarone  200 mg Oral BID  . atorvastatin  20 mg Oral QHS  . divalproex  1,000 mg Oral BID  . ferrous sulfate  325 mg Oral Q breakfast  . furosemide  40 mg Oral BID  . gabapentin  600 mg Oral BID  . hydrocortisone  25 mg Rectal BID  . levothyroxine  125 mcg Oral QAC breakfast  . polyethylene glycol  17 g Oral Daily  . risperiDONE  2 mg Oral QHS  . rivaroxaban  20 mg Oral Q supper  . senna  1 tablet Oral Daily      ASSESSMENT AND PLAN:    Facial droop     The patient had a recent facial droop that is improved. It was thought that this was related to the atrial flutter. The patient's TEE cardioversion done very recently revealed no obvious atrial clot. However with a history of a neurologic event, we would still want to do everything possible to continue her anticoagulation at this time    Atrial flutter [facility])      She is holding sinus rhythm.    Respiratory failure ([place])      Her oxygen need is above baseline, however, no obvious overload by exam.      Lasix changed to po on 08/30    Atrial myxoma     There is history of an atrial myxoma that was removed at [facility]. No CABG     Anemia     This is a very key issue. I am hopeful that this situation can be stabilized without stopping her anticoagulation. The patient had been on aspirin along with Xarelto. The aspirin has been stopped    PFO (patent foramen ovale)      The patient had a PFO repaired at the time that the atrial myxoma was removed at [facility].    HX: anticoagulation      The patient needs anticoagulation. There was a very recent TEE cardioversion. At that time the TEE showed no recurrent myxoma. There was no atrial appendage clot. However the patient did have some facial drooping recently. We need to do everything possible to continue her anticoagulation if possible.   Signed, Rosaria Ferries , PA-C 8:08 AM 07/23/2016 Agree with note by Rosaria Ferries PA-C  Maintaining NSR on Amio and Xarelto. S/P recent TEE DCCV. EF 35 %. Hopefully this will improve. Good diuresis now on PO lasix. Clinically improved. Foolw CBC. Repeat 2D in 3 months. No further rec at this time.   Lorretta Harp, M.D., [place], [facility], [place]ta [place] [address] [place], [place]  [address]  [phone] 07/23/2016 9:56 AM

## 2016-07-23 NOTE — Progress Notes (Signed)
Patient placed on BIPAP HS with no complications. Nurse aware.

## 2016-07-23 NOTE — Progress Notes (Signed)
Report received in patient's room via Jo RN using SBAR format, reviewed orders, labs, VS, meds and patient's general condition, assumed care of patient 

## 2016-07-23 NOTE — Progress Notes (Deleted)
Patient to CT scan for a CTA with monitor and transport via bed in stable condition, awaiting her return and will restart IVF's. IV team took awhile to get a new IV in her, was unable to place another one.

## 2016-07-23 NOTE — Clinical Social Work Note (Signed)
Clinical Social Worker continuing to follow patient and family for support and discharge planning needs.  CSW spoke with patient at bedside and patient daughter over the phone to further discuss bed offers.  Patient daughter states that with current offers, [facility] would be primary choice, however requested CSW to continue to pursue facilities closer to home.  CSW did receive an additional bed offer from [facility].  CSW attempted to contact patient daughter to update, however no voicemail set up at this time.  CSW to follow up with patient and patient daughter tomorrow to further discuss facility option and facilitate patient discharge needs.  CSW remains available for support.  Barbette Or, [place]

## 2016-07-23 NOTE — Progress Notes (Signed)
Physical Therapy Treatment Patient Details Name: Tonya Hogan MRN: [id number] DOB: August 14, 1951 Today's Date: 08-16-16    History of Present Illness Pt adm with AMS and acute hypoxia. Pt Dc'd day prior to admit after afib with rvr, cardioversion and workup for TIA. PMH - morbid obesity, HTN, copd, anemia, chf, thyroid CA, open heart surgery for removal of myxoma and PFO repair    PT Comments    Pt making good progress. Feel she will still benefit from ST-SNF to maximize her independence and decr burden of care at home.  Follow Up Recommendations  SNF     Equipment Recommendations  None recommended by PT    Recommendations for Other Services       Precautions / Restrictions Precautions Precautions: Fall Precaution Comments: on HFNC, monitor sats Restrictions Weight Bearing Restrictions: No    Mobility  Bed Mobility               General bed mobility comments: Pt sitting EOB  Transfers Overall transfer level: Needs assistance Equipment used: Rolling walker (2 wheeled) Transfers: Sit to/from Stand Sit to Stand: Min assist         General transfer comment: Assist for balance  Ambulation/Gait Ambulation/Gait assistance: Min assist Ambulation Distance (Feet): 200 Feet Assistive device: Rolling walker (2 wheeled) Gait Pattern/deviations: Step-through pattern;Decreased stride length;Drifts right/left;Wide base of support Gait velocity: decr Gait velocity interpretation: Below normal speed for age/gender General Gait Details: Assist for balance and support. Amb on 6L of O2. Unable to get accurate SpO2 reading during gait. After gait SpO2 97%.   Stairs            Wheelchair Mobility    Modified Rankin (Stroke Patients Only)       Balance Overall balance assessment: Needs assistance Sitting-balance support: No upper extremity supported Sitting balance-Leahy Scale: Good     Standing balance support: No upper extremity supported Standing  balance-Leahy Scale: Fair                      Cognition Arousal/Alertness: Awake/alert Behavior During Therapy: WFL for tasks assessed/performed Overall Cognitive Status: No family/caregiver present to determine baseline cognitive functioning                      Exercises      General Comments        Pertinent Vitals/Pain Pain Assessment: No/denies pain    Home Living                      Prior Function            PT Goals (current goals can now be found in the care plan section) Acute Rehab PT Goals Patient Stated Goal: to go home Progress towards PT goals: Progressing toward goals    Frequency  Min 3X/week    PT Plan Current plan remains appropriate    Co-evaluation             End of Session Equipment Utilized During Treatment: Oxygen Activity Tolerance: Patient tolerated treatment well Patient left: in chair;with call bell/phone within reach;with chair alarm set     Time: 1030-1056 PT Time Calculation (min) (ACUTE ONLY): 26 min  Charges:  $Gait Training: 23-37 mins                    G Codes:      Tonya Hogan 2016/08/16, 12:27 PM [employer] PT [phone]

## 2016-07-23 NOTE — Care Management Note (Signed)
Case Management Note  Patient Details  Name: Tonya Hogan MRN: [id number] Date of Birth: July 18, 1951  Subjective/Objective:  Pt presented for Respiratory Failure. Pt is from home with DME 02 & RW.  PT recommendations for SNF. Pt is agreeable to [facility] placement.                 Action/Plan: CSW aware and working with pt in regards to disposition needs. No further needs from CM at this time.   Expected Discharge Date:  07/22/16               Expected Discharge Plan:  Skilled Nursing Facility  In-House Referral:  Clinical Social Work  Discharge planning Services  CM Consult  Post Acute Care Choice:  NA Choice offered to:  NA  DME Arranged:  N/A DME Agency:  NA  HH Arranged:  NA HH Agency:  NA  Status of Service:  Completed, signed off  If discussed at [employer] of Stay Meetings, dates discussed:    Additional Comments:  Bethena Roys, RN 07/23/2016, 11:17 AM

## 2016-07-23 NOTE — Progress Notes (Signed)
PROGRESS NOTE    Tonya Hogan  [id number] DOB: 02-03-1951 DOA: 07/19/2016 PCP: No PCP Per Patient   Brief Narrative:   65 y.o. BF PMHx  Bipolar DO, TIA, Obesity , HTN hypertension,, COPD on chronic home oxygen at night, CAD native artery status post CABG, Chronic Systolic CHF, Atrial Flutter S/P cardioversion on 8/25, Hypothyroidism  she was discharged from the hospital on 8/27 after TIA work up and treating aflutter /RVR, she  presents to [facility] with respiratory failure, she is found to be hypoxic in the 80's. cxr with bilateral infiltrate pulmonary edema vs pna, she does has leukocytosis, lactic acidosis, initial on presentation to ED she had brief hypotension required fluids boluses, she was put on bipap, hospitalist called who accepted the patient to be transferred to [facility] cone stepdown unit. She also reported chest tightness, [facility] EDP called cardiology as well. She was given vanc/zosyn and transferred to [place]. Patient currently report feeling better, denies fever, does report cough, more at night, currently chest pain free, able to talk in full sentences on 5 liter oxygen, she does report lower extremity edema.   Subjective: Denies dyspnea. No further blood BM./    Assessment & Plan:   Active Problems:   Facial droop   Atrial flutter (HCC)   Respiratory failure (HCC)   Hypoxia   Atrial myxoma   Anemia   PFO (patent foramen ovale)   HX: anticoagulation    Acute on Chronic Hypoxic Respiratory Failure: Uses 2 L oxygen at home  - initially required BiPAP. Better. -Most likely secondary to CHF. PCXR and patient's symptoms like consistent with infection. Stop antibiotics   Acute on Chronic Systolic CHF:  bnp elevated from basline. Troponin negative, ekg no acute changes, continue betablocker, hold acei for now due to tendency of hypotension, start iv lasix. Cardiology consulted. -Strict I&O since admission -8.8 L -Daily weight Filed Weights   07/21/16 0454 07/22/16  0402 07/23/16 0338  Weight: 121.1 kg (267 lb) 121.9 kg (268 lb 11.2 oz) 122.2 kg (269 lb 4.8 oz)  -Continue amiodarone 200 mg BID -Patient's states base weight 260 pounds.  -On Oxygen 4 L. Off BIPAP this am.  -Hypotension resolved.  On oral lasix.   A-flutter/TIA,  - continue betablocker/amiodarone/. Cardiology following -monitor hb on xarelto.   Probably hemorrhoids bleed: Continue with Miralax.  Report blood in the stool after having hard, BM.  sitz bath, hydrocortisone suppository.  Hb stable. No further bleeding.  Advance diet   CAD native artery s/p cabg:  -continue betablocker, statin, hold asa. Currently chest pain free, troponin negative, ekg no acute changes.  HTN:  -See CHF  Morbid Obesity:  -Body mass index is 55.33 kg/m. life style modification.  Psych:  -stable , continue home meds depakote/risperdal    DVT prophylaxis: Xarelto Code Status: Full Family Communication: None Disposition Plan: discharge 9-02 if hb remain stable    Consultants:  Dr.Jonathan J Gwenlyn [facility]    Procedures/Significant Events:  None   Cultures 8/22 MRSA by PCR positive 8/28 blood left forearm/right arm NGTD 8/28 urine pending 8/28 MRSA by PCR negative   Antimicrobials: Zosyn 8/28>> 8/29 Vancomycin 8/28>> 8/29   Devices    LINES / TUBES:      Continuous Infusions:    Objective: Vitals:   07/23/16 0338 07/23/16 0351 07/23/16 0744 07/23/16 0800  BP:  (!) 99/50 124/81 124/81  Pulse:  64  74  Resp:  18  (!) 24  Temp:  98.2 F (36.8 C)  97.4 F (36.3 C)  TempSrc:  Oral  Oral  SpO2:  99%  98%  Weight: 122.2 kg (269 lb 4.8 oz)     Height:        Intake/Output Summary (Last 24 hours) at 07/23/16 1243 Last data filed at 07/23/16 0831  Gross per 24 hour  Intake             1160 ml  Output             1300 ml  Net             -140 ml   Filed Weights   07/21/16 0454 07/22/16 0402 07/23/16 0338  Weight: 121.1 kg (267 lb) 121.9 kg (268 lb  11.2 oz) 122.2 kg (269 lb 4.8 oz)    Examination:  General: A/O 4, positive acute on chronic respiratory distress Eyes: negative scleral hemorrhage, negative anisocoria, negative icterus ENT: Negative Runny nose, negative gingival bleeding, Neck:  Negative scars, masses, torticollis, lymphadenopathy, JVD Lungs: bilateral air movement, no wheezing.  Cardiovascular: Regular rate and rhythm without murmur gallop or rub normal S1 and S2 Abdomen: MORBID OBESITY, negative abdominal pain, nondistended, positive soft, bowel sounds, no rebound, no ascites, no appreciable mass Extremities: trace edema Central nervous system: speech more clear.  .   CBC:  Recent Labs Lab 07/19/16 1240 07/20/16 0347 07/21/16 1000 07/22/16 0147 07/22/16 0627 07/22/16 1706 07/23/16 0017 07/23/16 0752  WBC 13.5* 10.7* 7.5  --  6.3  --   --  6.3  NEUTROABS 11.0*  --   --   --   --   --   --   --   HGB 8.4* 7.8* 8.5* 8.0* 8.7* 8.9* 8.5* 9.1*  HCT 28.9* 28.3* 30.4* 28.9* 30.9* 31.8* 29.7* 32.5*  MCV 85.3 86.8 86.9  --  86.6  --   --  86.2  PLT 262 238 240  --  250  --   --  123456   Basic Metabolic Panel:  Recent Labs Lab 07/18/16 0313 07/19/16 1240 07/20/16 0347 07/21/16 1000 07/22/16 0146  NA 140 139 142 140 140  K 4.4 4.0 3.8 3.7 3.6  CL 100* 101 100* 92* 92*  CO2 34* 30 38* 38* 41*  GLUCOSE 81 109* 102* 170* 75  BUN 20 22* 13 13 18   CREATININE 1.03* 0.87 0.86 0.89 0.91  CALCIUM 8.8* 8.5* 8.3* 8.5* 8.3*  MG  --   --  2.0  --   --    GFR: Estimated Creatinine Clearance: 75.1 mL/min (by C-G formula based on SCr of 0.91 mg/dL). Liver Function Tests:  Recent Labs Lab 07/19/16 1240  AST 14*  ALT 10*  ALKPHOS 53  BILITOT 0.1*  PROT 7.5  ALBUMIN 2.9*   No results for input(s): LIPASE, AMYLASE in the last 168 hours. No results for input(s): AMMONIA in the last 168 hours. Coagulation Profile:  Recent Labs Lab 07/19/16 1240  INR 1.10   Cardiac Enzymes:  Recent Labs Lab  07/19/16 1250  TROPONINI <0.03   BNP (last 3 results) No results for input(s): PROBNP in the last 8760 hours. HbA1C: No results for input(s): HGBA1C in the last 72 hours. CBG: No results for input(s): GLUCAP in the last 168 hours. Lipid Profile: No results for input(s): CHOL, HDL, LDLCALC, TRIG, CHOLHDL, LDLDIRECT in the last 72 hours. Thyroid Function Tests: No results for input(s): TSH, T4TOTAL, FREET4, T3FREE, THYROIDAB in the last 72 hours. Anemia Panel: No results for input(s): VITAMINB12, FOLATE, FERRITIN, TIBC,  IRON, RETICCTPCT in the last 72 hours. Urine analysis:    Component Value Date/Time   COLORURINE YELLOW 07/19/2016 1350   APPEARANCEUR CLEAR 07/19/2016 1350   LABSPEC 1.022 07/19/2016 1350   PHURINE 6.0 07/19/2016 1350   GLUCOSEU NEGATIVE 07/19/2016 1350   HGBUR NEGATIVE 07/19/2016 1350   BILIRUBINUR NEGATIVE 07/19/2016 1350   KETONESUR NEGATIVE 07/19/2016 1350   PROTEINUR NEGATIVE 07/19/2016 1350   UROBILINOGEN 1.0 10/06/2015 2010   NITRITE NEGATIVE 07/19/2016 1350   LEUKOCYTESUR NEGATIVE 07/19/2016 1350   Sepsis Labs: @LABRCNTIP (procalcitonin:4,lacticidven:4)  ) Recent Results (from the past 240 hour(s))  MRSA PCR Screening     Status: Abnormal   Collection Time: 07/13/16  6:46 PM  Result Value Ref Range Status   MRSA by PCR POSITIVE (A) NEGATIVE Final    Comment:        The GeneXpert MRSA Assay (FDA approved for NASAL specimens only), is one component of a comprehensive MRSA colonization surveillance program. It is not intended to diagnose MRSA infection nor to guide or monitor treatment for MRSA infections. RESULT CALLED TO, READ BACK BY AND VERIFIED WITH: T IRBY RN [id number] 07/13/16 A BROWNING   Blood Culture (routine x 2)     Status: None (Preliminary result)   Collection Time: 07/19/16  1:10 PM  Result Value Ref Range Status   Specimen Description BLOOD LEFT FOREARM  Final   Special Requests BOTTLES DRAWN AEROBIC AND ANAEROBIC 5CC EACH  Final    Culture   Final    NO GROWTH 4 DAYS Performed at [facility]    Report Status PENDING  Incomplete  Blood Culture (routine x 2)     Status: None (Preliminary result)   Collection Time: 07/19/16  1:40 PM  Result Value Ref Range Status   Specimen Description BLOOD RIGHT ARM  Final   Special Requests BOTTLES DRAWN AEROBIC AND ANAEROBIC 5CC EACH  Final   Culture   Final    NO GROWTH 4 DAYS Performed at [facility]    Report Status PENDING  Incomplete  MRSA PCR Screening     Status: None   Collection Time: 07/19/16  6:23 PM  Result Value Ref Range Status   MRSA by PCR NEGATIVE NEGATIVE Final    Comment:        The GeneXpert MRSA Assay (FDA approved for NASAL specimens only), is one component of a comprehensive MRSA colonization surveillance program. It is not intended to diagnose MRSA infection nor to guide or monitor treatment for MRSA infections.          Radiology Studies: No results found.      Scheduled Meds: . amiodarone  200 mg Oral BID  . atorvastatin  20 mg Oral QHS  . divalproex  1,000 mg Oral BID  . ferrous sulfate  325 mg Oral Q breakfast  . furosemide  40 mg Oral BID  . gabapentin  600 mg Oral BID  . hydrocortisone  25 mg Rectal BID  . levothyroxine  125 mcg Oral QAC breakfast  . polyethylene glycol  17 g Oral Daily  . risperiDONE  2 mg Oral QHS  . rivaroxaban  20 mg Oral Q supper  . senna  1 tablet Oral Daily   Continuous Infusions:    LOS: 4 days    Time spent: 35 minutes    Elmarie Shiley, MD [facility] Pager [phone]  If 7PM-7AM, please contact night-coverage [email or web address] Password [facility] 07/23/2016, 12:43 PM

## 2016-07-24 LAB — CULTURE, BLOOD (ROUTINE X 2)
Culture: NO GROWTH
Culture: NO GROWTH

## 2016-07-24 LAB — CBC
HCT: 32.8 % — ABNORMAL LOW (ref 36.0–46.0)
Hemoglobin: 9.1 g/dL — ABNORMAL LOW (ref 12.0–15.0)
MCH: 24.2 pg — AB (ref 26.0–34.0)
MCHC: 27.7 g/dL — AB (ref 30.0–36.0)
MCV: 87.2 fL (ref 78.0–100.0)
PLATELETS: 275 10*3/uL (ref 150–400)
RBC: 3.76 MIL/uL — ABNORMAL LOW (ref 3.87–5.11)
RDW: 17.3 % — ABNORMAL HIGH (ref 11.5–15.5)
WBC: 6.1 10*3/uL (ref 4.0–10.5)

## 2016-07-24 LAB — CBC AND DIFFERENTIAL: WBC: 6.1 10^3/mL

## 2016-07-24 MED ORDER — TRAMADOL HCL 50 MG PO TABS: 50.0000 mg | ORAL_TABLET | Freq: Three times a day (TID) | ORAL | 0 refills | Status: DC | PRN

## 2016-07-24 MED ORDER — FUROSEMIDE 40 MG PO TABS
40.0000 mg | ORAL_TABLET | Freq: Two times a day (BID) | ORAL | 0 refills | Status: DC
Start: 2016-07-24 — End: 2016-08-18

## 2016-07-24 MED ORDER — POLYETHYLENE GLYCOL 3350 17 G PO PACK: 17.0000 g | PACK | Freq: Every day | ORAL | 0 refills | Status: AC

## 2016-07-24 NOTE — Progress Notes (Signed)
Updated report received via Denice Paradise RN in patient's room using SBAR format, updated VS, meds, orders and tests that were done today along with patient's general condition and any changes in condition, assumed care of patient.

## 2016-07-24 NOTE — Clinical Social Work Placement (Signed)
   CLINICAL SOCIAL WORK PLACEMENT  NOTE  Date:  07/24/2016  Patient Details  Name: Tonya Hogan MRN: [id number] Date of Birth: [birth year]  Clinical Social Work is seeking post-discharge placement for this patient at the [place] level of care (*CSW will initial, date and re-position this form in  chart as items are completed):  Yes   Patient/family provided with [facility] Work Department's list of facilities offering this level of care within the geographic area requested by the patient (or if unable, by the patient's family).  Yes   Patient/family informed of their freedom to choose among providers that offer the needed level of care, that participate in Medicare, Medicaid or managed care program needed by the patient, have an available bed and are willing to accept the patient.  Yes   Patient/family informed of [facility]'s ownership interest in [facility] and [facility], as well as of the fact that they are under no obligation to receive care at these facilities.  PASRR submitted to EDS on 07/22/16     PASRR number received on       Existing PASRR number confirmed on 07/24/16     FL2 transmitted to all facilities in geographic area requested by pt/family on 07/22/16     FL2 transmitted to all facilities within larger geographic area on       Patient informed that his/her managed care company has contracts with or will negotiate with certain facilities, including the following:        Yes   Patient/family informed of bed offers received.  Patient chooses bed at [facility]     Physician recommends and patient chooses bed at      Patient to be transferred to [facility] on 07/24/16.  Patient to be transferred to facility by ambulance     Patient family notified on 07/24/16 of transfer.  Name of family member notified:  Bonner,Angela     PHYSICIAN Please sign FL2, Please prepare priority  discharge summary, including medications, Please prepare prescriptions     Additional Comment:  Per MD patient is ready to discharge to [facility]. RN, patient, patient's family, and facility notified of discharge. RN given phone number for report and transport packet is on patient's chart. Ambulance transport requested. CSW signing off.   _______________________________________________ Samule Dry, LCSW 07/24/2016, 11:39 AM

## 2016-07-24 NOTE — Discharge Summary (Signed)
Physician Discharge Summary  Tonya Hogan [id number] DOB: 10/28/1951 DOA: 07/19/2016  PCP: No PCP Per Patient  Admit date: 07/19/2016 Discharge date: 07/24/2016  Admitted From: Home  Disposition:  SNF  Recommendations for Outpatient Follow-up:  1. Follow up with PCP in 1-2 weeks 2. Please obtain BMP/CBC in one week 3. Follow with cardiology for further care A fib.     Discharge Condition: Stable.  CODE STATUS: Full code.  Diet recommendation: Heart Healthy  Brief/Interim Summary: 65 y.o.BF PMHx  Bipolar DO, TIA, Obesity , HTN hypertension,, COPD on chronic home oxygen at night, CAD native artery status post CABG, Chronic SystolicCHF, Atrial Flutter S/P cardioversion on 8/25, Hypothyroidism  she was discharged from the hospital on 8/27 after TIA work up and treating aflutter /RVR, she presents to [facility] with respiratory failure, she is found to be hypoxic in the 80's. cxr with bilateral infiltrate pulmonary edema vs pna, she does has leukocytosis, lactic acidosis, initial on presentation to ED she had brief hypotension required fluids boluses, she was put on bipap, hospitalist called who accepted the patient to be transferred to [facility] cone stepdown unit. She also reported chest tightness, [facility] EDP called cardiology as well. She was given vanc/zosyn and transferred to [place]. Patient currently report feeling better, denies fever, does report cough, more at night, currently chest pain free, able to talk in full sentences on 5 liter oxygen, she does report lower extremity edema.    Assessment & Plan:  Acute on Chronic Hypoxic Respiratory Failure: Uses 2 L oxygen at home  - initially required BiPAP. Better. -Most likely secondary to CHF. PCXR and patient's symptoms like consistent with infection. Stop antibiotics   Acute on Chronic Systolic CHF:  bnp elevated from basline. Troponin negative, ekg no acute changes, continue betablocker, hold acei for now due to tendency of  hypotension, start iv lasix. Cardiology consulted. -Strict I&O since admission -8.8 L -Daily weight      Filed Weights   07/21/16 0454 07/22/16 0402 07/23/16 0338  Weight: 121.1 kg (267 lb) 121.9 kg (268 lb 11.2 oz) 122.2 kg (269 lb 4.8 oz)  -Continue amiodarone 200 mg BID -Patient's states base weight 260 pounds.  -On Oxygen 3 L. Off BIPAP this am.  -Hypotension resolved.  On oral lasix.   A-flutter/TIA,  - continue betablocker/amiodarone/. Cardiology following -monitor hb on xarelto.   Probably hemorrhoids bleed: Continue with Miralax.  Report blood in the stool after having hard, BM.  sitz bath, received hydrocortisone suppository .  Hb stable. No further bleeding.  Advance diet   CAD native artery s/p cabg:  -continue , statin, hold asa. Currently chest pain free, troponin negative, ekg no acute changes. beta-blocker was discontinue due to hypotension.   HTN:  -See CHF  Morbid Obesity:  -Body mass index is 55.33 kg/m.life style modification.  Psych:  -stable , continue home meds depakote/risperdal    Discharge Diagnoses:  Active Problems:   Facial droop   Atrial flutter (HCC)   Respiratory failure (HCC)   Hypoxia   Atrial myxoma   Anemia   PFO (patent foramen ovale)   HX: anticoagulation    Discharge Instructions  Discharge Instructions    Diet - low sodium heart healthy    Complete by:  As directed   Increase activity slowly    Complete by:  As directed       Medication List    STOP taking these medications   lisinopril 10 MG tablet Commonly known as:  PRINIVIL,ZESTRIL  metoprolol succinate 25 MG 24 hr tablet Commonly known as:  TOPROL-XL     TAKE these medications   albuterol 108 (90 Base) MCG/ACT inhaler Commonly known as:  PROVENTIL HFA;VENTOLIN HFA Inhale 2 puffs into the lungs every 6 (six) hours as needed for wheezing or shortness of breath.   amiodarone 200 MG tablet Commonly known as:  PACERONE Take 1 tablet (200  mg total) by mouth 2 (two) times daily.   aspirin 81 MG EC tablet Take 1 tablet (81 mg total) by mouth daily.   atorvastatin 20 MG tablet Commonly known as:  LIPITOR Take 1 tablet (20 mg total) by mouth at bedtime.   bisacodyl 5 MG EC tablet Commonly known as:  DULCOLAX Take 5 mg by mouth 2 (two) times daily as needed for mild constipation.   divalproex 500 MG 24 hr tablet Commonly known as:  DEPAKOTE ER Take 1,000 mg by mouth 2 (two) times daily.   ferrous sulfate 325 (65 FE) MG tablet Take 325 mg by mouth daily with breakfast.   Fish Oil 1000 MG Caps Take 1,000 mg by mouth daily.   furosemide 40 MG tablet Commonly known as:  LASIX Take 1 tablet (40 mg total) by mouth 2 (two) times daily. What changed:  how much to take  when to take this   gabapentin 600 MG tablet Commonly known as:  NEURONTIN Take 600 mg by mouth 2 (two) times daily.   levothyroxine 125 MCG tablet Commonly known as:  SYNTHROID, LEVOTHROID Take 125 mcg by mouth daily before breakfast.   OXYGEN Inhale 2 L into the lungs continuous.   polyethylene glycol packet Commonly known as:  MIRALAX / GLYCOLAX Take 17 g by mouth daily. What changed:  when to take this  reasons to take this   potassium chloride SA 20 MEQ tablet Commonly known as:  K-DUR,KLOR-CON Take 1 tablet (20 mEq total) by mouth daily.   RA ACETAMINOPHEN 650 MG CR tablet Generic drug:  acetaminophen Take 650 mg by mouth every 8 (eight) hours as needed for pain. pain   risperiDONE 2 MG tablet Commonly known as:  RISPERDAL Take 2 mg by mouth at bedtime.   rivaroxaban 20 MG Tabs tablet Commonly known as:  XARELTO Take 1 tablet (20 mg total) by mouth daily with supper.   traMADol 50 MG tablet Commonly known as:  ULTRAM Take 1 tablet (50 mg total) by mouth every 8 (eight) hours as needed for severe pain.   zolpidem 5 MG tablet Commonly known as:  AMBIEN Take 5 mg by mouth at bedtime.       Allergies  Allergen  Reactions  . Methimazole Hives and Rash  . Propylthiouracil Hives  . Chocolate Other (See Comments)    Sneezing, coughing  . Other Other (See Comments)    Corn, cabbage, greens cause Rectal bleeding  . Peanut-Containing Drug Products Other (See Comments)    Sneezing,coughing  . Penicillins Itching, Other (See Comments) and Cough    Sneezing Has patient had a PCN reaction causing immediate rash, facial/tongue/throat swelling, SOB or lightheadedness with hypotension: Yes Has patient had a PCN reaction causing severe rash involving mucus membranes or skin necrosis: Unknown Has patient had a PCN reaction that required hospitalization: Unknown Has patient had a PCN reaction occurring within the last 10 years: No If all of the above answers are "NO", then may proceed with Cephalosporin use.   . Vicodin [Hydrocodone-Acetaminophen] Rash    Consultations:  Cardiology    Procedures/Studies:  Dg Chest 2 View  Result Date: 07/13/2016 CLINICAL DATA:  Slurred speech. EXAM: CHEST  2 VIEW COMPARISON:  Radiographs of April 30, 2016. FINDINGS: Stable cardiomediastinal silhouette. Sternotomy wires are noted. No pneumothorax or pleural effusion is noted. Bony thorax is unremarkable. Slightly improved linear densities are noted in the perihilar regions consistent with improving subsegmental atelectasis. IMPRESSION: Mildly improved bilateral parahilar subsegmental atelectasis. Electronically Signed   By: Marijo Conception, M.D.   On: 07/13/2016 14:56   Dg Chest 2 View  Result Date: 06/30/2016 CLINICAL DATA:  Initial evaluation for medical clearance. Mental health admission. EXAM: CHEST  2 VIEW COMPARISON:  Prior radiograph from 04/07/2016. FINDINGS: Median sternotomy wires noted, stable. Mild cardiomegaly is unchanged. Mediastinal silhouette within normal limits. Atheromatous plaque within the aortic arch. Lungs are normally inflated. Mild perihilar vascular congestion without pulmonary edema. No pleural  effusion. The scarring at the mid left lung again noted, similar to prior. Streaky right perihilar opacities also compatible with scarring and/or atelectasis. No consolidative airspace disease. No pneumothorax. Surgical clips overlie the lower neck. No acute osseous abnormality. IMPRESSION: 1. Mild perihilar vascular congestion without overt pulmonary edema. 2. Scarring at the left mid and lower lung, similar to prior. Streaky right perihilar opacities also compatible with atelectasis and/or scarring. 3. Aortic atherosclerosis. Electronically Signed   By: Jeannine Boga M.D.   On: 06/30/2016 20:48   Ct Head Wo Contrast  Result Date: 07/13/2016 CLINICAL DATA:  Slurred speech beginning last night. EXAM: CT HEAD WITHOUT CONTRAST TECHNIQUE: Contiguous axial images were obtained from the base of the skull through the vertex without intravenous contrast. COMPARISON:  MRI 10/07/2015 FINDINGS: Brain: No acute intracranial abnormality. Specifically, no hemorrhage, hydrocephalus, mass lesion, acute infarction, or significant intracranial injury. Vascular: No hyperdense vessel or unexpected calcification. Skull: No acute calvarial abnormality Sinuses/Orbits: Visualized paranasal sinuses and mastoids clear. Orbital soft tissues unremarkable. Other: None IMPRESSION: No acute intracranial abnormality. Electronically Signed   By: Rolm Baptise M.D.   On: 07/13/2016 14:45   Ct Angio Chest Pe W And/or Wo Contrast  Result Date: 07/13/2016 CLINICAL DATA:  Tachycardia, shoulder pain. EXAM: CT ANGIOGRAPHY CHEST WITH CONTRAST TECHNIQUE: Multidetector CT imaging of the chest was performed using the standard protocol during bolus administration of intravenous contrast. Multiplanar CT image reconstructions and MIPs were obtained to evaluate the vascular anatomy. CONTRAST:  100 mL of Isovue 370 intravenously. COMPARISON:  CT scan of June 22, 2015. FINDINGS: No pneumothorax or pleural effusion is noted. Mild bibasilar  subsegmental atelectasis is noted. There is no evidence of large central pulmonary embolus. However, due to limitation in terms of timing of contrast bolus, smaller peripheral emboli in lower lobe branches cannot be excluded on the basis of this exam. Status post coronary artery bypass graft. Atherosclerosis of thoracic aorta is noted without aneurysm or dissection. Visualized portion of upper abdomen is unremarkable. No mediastinal mass or adenopathy is noted. Review of the MIP images confirms the above findings. IMPRESSION: Aortic atherosclerosis. Mild bibasilar subsegmental atelectasis. No evidence of large central pulmonary embolus. However, due to limitations of contrast bolus, smaller peripheral emboli in lower lobe branches cannot be excluded on the basis of this exam. Electronically Signed   By: Marijo Conception, M.D.   On: 07/13/2016 17:11   Mr Brain Wo Contrast  Result Date: 07/14/2016 CLINICAL DATA:  65 y/o F; presenting with right facial droop and slurred speech beginning 4 days ago. EXAM: MRI HEAD WITHOUT CONTRAST MRA HEAD WITHOUT CONTRAST TECHNIQUE: Multiplanar, multiecho pulse  sequences of the brain and surrounding structures were obtained without intravenous contrast. Angiographic images of the head were obtained using MRA technique without contrast. COMPARISON:  CT of head dated 07/13/2016.  MRI brain 10/07/2015. FINDINGS: MRI HEAD FINDINGS Brain: No diffusion restriction to suggest acute infarct. There are punctate foci of susceptibility hypointensity within the cerebellum and bilateral frontal and parietal subcortical white matter without significant interval change compatible with prior microhemorrhage. Motion artifact partially degrades the FLAIR sequence. 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 The left parietal and temporal infarcts have mild foraminal 7 staining. No focal mass effect. Mild parenchymal atrophy.  Extra-axial space: No hydrocephalus. No midline shift. No effacement of basilar cisterns. No extra-axial collection is identified. Proximal intracranial flow voids are maintained. No abnormality of the cervical medullary junction. Other: No abnormal signal of the paranasal sinuses. Partial opacification of right mastoid air cells. Orbits are unremarkable. Calvarium is unremarkable. 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 Anterior cerebral arteries:  Patent. Middle cerebral arteries: Patent. Anterior communicating artery: Patent. Posterior communicating arteries:  Patent. Posterior cerebral arteries: Patent bilaterally. Small left P1 segment with large left posterior communicating artery consistent with fetal circulation, normal variant. Basilar artery:  Patent. Vertebral arteries:  Patent.  Left dominant. No evidence of significant stenosis or large vessel occlusion. IMPRESSION: 1. No evidence of acute/early subacute infarct or new intracranial hemorrhage. 2. 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 3. Stable parenchymal volume loss and chronic infarcts within left temporal, left parietal, and right cerebellum. 4. No significant stenosis or large vessel occlusion of the circle of Willis. These results will be called to the ordering clinician or representative by the Radiologist Assistant, and communication documented in the PACS or zVision Dashboard. Electronically Signed   By: Kristine Garbe M.D.   On: 07/14/2016 01:58   Dg Chest Port 1 View  Result Date: 07/19/2016 CLINICAL DATA:  Shortness of breath. EXAM: PORTABLE CHEST 1 VIEW COMPARISON:  07/13/2016 FINDINGS: Prior CABG. Mild cardiomegaly with vascular congestion. Low lung volumes. Linear areas of scarring or atelectasis in the lungs bilaterally. No  effusions. No acute bony abnormality. IMPRESSION: Areas of mid lung atelectasis or scarring. Cardiomegaly, vascular congestion. Low lung volumes. Electronically Signed   By: Rolm Baptise M.D.   On: 07/19/2016 13:19   Mr Jodene Nam Head/brain [id number] Cm  Result Date: 07/14/2016 CLINICAL DATA:  65 y/o F; presenting with right facial droop and slurred speech beginning 4 days ago. EXAM: MRI HEAD WITHOUT CONTRAST MRA HEAD WITHOUT CONTRAST TECHNIQUE: Multiplanar, multiecho pulse sequences of the brain and surrounding structures were obtained without intravenous contrast. Angiographic images of the head were obtained using MRA technique without contrast. COMPARISON:  CT of head dated 07/13/2016.  MRI brain 10/07/2015. FINDINGS: MRI HEAD FINDINGS Brain: No diffusion restriction to suggest acute infarct. There are punctate foci of susceptibility hypointensity within the cerebellum and bilateral frontal and parietal subcortical white matter without significant interval change compatible with prior microhemorrhage. Motion artifact partially degrades the FLAIR sequence. 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 The left parietal and temporal infarcts have mild foraminal 7 staining. No focal mass effect. Mild parenchymal atrophy. Extra-axial space: No hydrocephalus. No midline shift. No effacement of basilar cisterns. No extra-axial collection  is identified. Proximal intracranial flow voids are maintained. No abnormality of the cervical medullary junction. Other: No abnormal signal of the paranasal sinuses. Partial opacification of right mastoid air cells. Orbits are unremarkable. Calvarium is unremarkable. 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 Anterior cerebral arteries:  Patent. Middle cerebral arteries: Patent.  Anterior communicating artery: Patent. Posterior communicating arteries:  Patent. Posterior cerebral arteries: Patent bilaterally. Small left P1 segment with large left posterior communicating artery consistent with fetal circulation, normal variant. Basilar artery:  Patent. Vertebral arteries:  Patent.  Left dominant. No evidence of significant stenosis or large vessel occlusion. IMPRESSION: 1. No evidence of acute/early subacute infarct or new intracranial hemorrhage. 2. 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 3. Stable parenchymal volume loss and chronic infarcts within left temporal, left parietal, and right cerebellum. 4. No significant stenosis or large vessel occlusion of the circle of Willis. These results will be called to the ordering clinician or representative by the Radiologist Assistant, and communication documented in the PACS or zVision Dashboard. Electronically Signed   By: Kristine Garbe M.D.   On: 07/14/2016 01:58       Subjective: Feeling better, breathing better.   Discharge Exam: Vitals:   07/24/16 0500 07/24/16 0811  BP: 122/76   Pulse: 63   Resp: 18   Temp: 98.6 F (37 C) 98.3 F (36.8 C)   Vitals:   07/23/16 2000 07/24/16 0100 07/24/16 0500 07/24/16 0811  BP: 95/68  122/76   Pulse: 78  63   Resp: 20  18   Temp: 98.3 F (36.8 C) 98.5 F (36.9 C) 98.6 F (37 C) 98.3 F (36.8 C)  TempSrc:    Oral  SpO2: 99%  94% 99%  Weight:   121.7 kg (268 lb 3.2 oz)   Height:        General: Pt is alert, awake, not in acute distress Cardiovascular: RRR, S1/S2 +, no rubs, no gallops Respiratory: CTA bilaterally, no wheezing, no rhonchi Abdominal: Soft, NT, ND, bowel sounds + Extremities: no edema, no cyanosis    The results of significant diagnostics from this hospitalization (including imaging, microbiology, ancillary and laboratory) are listed below for reference.     Microbiology: Recent Results (from  the past 240 hour(s))  Blood Culture (routine x 2)     Status: None (Preliminary result)   Collection Time: 07/19/16  1:10 PM  Result Value Ref Range Status   Specimen Description BLOOD LEFT FOREARM  Final   Special Requests BOTTLES DRAWN AEROBIC AND ANAEROBIC 5CC EACH  Final   Culture   Final    NO GROWTH 4 DAYS Performed at [facility]    Report Status PENDING  Incomplete  Blood Culture (routine x 2)     Status: None (Preliminary result)   Collection Time: 07/19/16  1:40 PM  Result Value Ref Range Status   Specimen Description BLOOD RIGHT ARM  Final   Special Requests BOTTLES DRAWN AEROBIC AND ANAEROBIC 5CC EACH  Final   Culture   Final    NO GROWTH 4 DAYS Performed at [facility]    Report Status PENDING  Incomplete  MRSA PCR Screening     Status: None   Collection Time: 07/19/16  6:23 PM  Result Value Ref Range Status   MRSA by PCR NEGATIVE NEGATIVE Final  Comment:        The GeneXpert MRSA Assay (FDA approved for NASAL specimens only), is one component of a comprehensive MRSA colonization surveillance program. It is not intended to diagnose MRSA infection nor to guide or monitor treatment for MRSA infections.      Labs: BNP (last 3 results)  Recent Labs  10/21/15 1235 07/13/16 1335 07/19/16 1250  BNP 152.1* 328.4* [id number]*   Basic Metabolic Panel:  Recent Labs Lab 07/19/16 1240 07/20/16 0347 07/21/16 1000 07/22/16 0146 07/23/16 1319  NA 139 142 140 140 140  K 4.0 3.8 3.7 3.6 3.7  CL 101 100* 92* 92* 92*  CO2 30 38* 38* 41* 42*  GLUCOSE 109* 102* 170* 75 108*  BUN 22* 13 13 18 7   CREATININE 0.87 0.86 0.89 0.91 0.92  CALCIUM 8.5* 8.3* 8.5* 8.3* 8.7*  MG  --  2.0  --   --   --    Liver Function Tests:  Recent Labs Lab 07/19/16 1240  AST 14*  ALT 10*  ALKPHOS 53  BILITOT 0.1*  PROT 7.5  ALBUMIN 2.9*   No results for input(s): LIPASE, AMYLASE in the last 168 hours. No results for input(s): AMMONIA in the last 168  hours. CBC:  Recent Labs Lab 07/19/16 1240 07/20/16 0347 07/21/16 1000  07/22/16 0627 07/22/16 1706 07/23/16 0017 07/23/16 0752 07/24/16 0330  WBC 13.5* 10.7* 7.5  --  6.3  --   --  6.3 6.1  NEUTROABS 11.0*  --   --   --   --   --   --   --   --   HGB 8.4* 7.8* 8.5*  < > 8.7* 8.9* 8.5* 9.1* 9.1*  HCT 28.9* 28.3* 30.4*  < > 30.9* 31.8* 29.7* 32.5* 32.8*  MCV 85.3 86.8 86.9  --  86.6  --   --  86.2 87.2  PLT 262 238 240  --  250  --   --  266 275  < > = values in this interval not displayed. Cardiac Enzymes:  Recent Labs Lab 07/19/16 1250  TROPONINI <0.03   BNP: Invalid input(s): POCBNP CBG: No results for input(s): GLUCAP in the last 168 hours. D-Dimer No results for input(s): DDIMER in the last 72 hours. Hgb A1c No results for input(s): HGBA1C in the last 72 hours. Lipid Profile No results for input(s): CHOL, HDL, LDLCALC, TRIG, CHOLHDL, LDLDIRECT in the last 72 hours. Thyroid function studies No results for input(s): TSH, T4TOTAL, T3FREE, THYROIDAB in the last 72 hours.  Invalid input(s): FREET3 Anemia work up No results for input(s): VITAMINB12, FOLATE, FERRITIN, TIBC, IRON, RETICCTPCT in the last 72 hours. Urinalysis    Component Value Date/Time   COLORURINE YELLOW 07/19/2016 1350   APPEARANCEUR CLEAR 07/19/2016 1350   LABSPEC 1.022 07/19/2016 1350   PHURINE 6.0 07/19/2016 1350   GLUCOSEU NEGATIVE 07/19/2016 1350   HGBUR NEGATIVE 07/19/2016 1350   BILIRUBINUR NEGATIVE 07/19/2016 1350   KETONESUR NEGATIVE 07/19/2016 1350   PROTEINUR NEGATIVE 07/19/2016 1350   UROBILINOGEN 1.0 10/06/2015 2010   NITRITE NEGATIVE 07/19/2016 1350   LEUKOCYTESUR NEGATIVE 07/19/2016 1350   Sepsis Labs Invalid input(s): PROCALCITONIN,  WBC,  LACTICIDVEN Microbiology Recent Results (from the past 240 hour(s))  Blood Culture (routine x 2)     Status: None (Preliminary result)   Collection Time: 07/19/16  1:10 PM  Result Value Ref Range Status   Specimen Description BLOOD  LEFT FOREARM  Final   Special Requests BOTTLES DRAWN AEROBIC AND ANAEROBIC  [facility] EACH  Final   Culture   Final    NO GROWTH 4 DAYS Performed at [facility]    Report Status PENDING  Incomplete  Blood Culture (routine x 2)     Status: None (Preliminary result)   Collection Time: 07/19/16  1:40 PM  Result Value Ref Range Status   Specimen Description BLOOD RIGHT ARM  Final   Special Requests BOTTLES DRAWN AEROBIC AND ANAEROBIC 5CC EACH  Final   Culture   Final    NO GROWTH 4 DAYS Performed at [facility]    Report Status PENDING  Incomplete  MRSA PCR Screening     Status: None   Collection Time: 07/19/16  6:23 PM  Result Value Ref Range Status   MRSA by PCR NEGATIVE NEGATIVE Final    Comment:        The GeneXpert MRSA Assay (FDA approved for NASAL specimens only), is one component of a comprehensive MRSA colonization surveillance program. It is not intended to diagnose MRSA infection nor to guide or monitor treatment for MRSA infections.      Time coordinating discharge: Over 30 minutes  SIGNED:   Elmarie Shiley, MD  [facility] 07/24/2016, 9:22 AM Pager [phone]  If 7PM-7AM, please contact night-coverage [email or web address] Password [email or web address]

## 2016-07-24 NOTE — Plan of Care (Signed)
Problem: Safety: Goal: Ability to remain free from injury will improve Outcome: Progressing Patient instructed to call for help using the call light or the phone numbers for RN/NT written on the board and she verbalized understanding, personal items within reach on her bedside stand and bed alarm on with patient's knowledge, will continue to monitor.

## 2016-07-24 NOTE — Progress Notes (Signed)
Report called to [facility] to [place], Therapist, sports. No questions at this time.

## 2016-07-27 ENCOUNTER — Non-Acute Institutional Stay (SKILLED_NURSING_FACILITY): Payer: Medicare Other | Admitting: Internal Medicine

## 2016-07-27 ENCOUNTER — Encounter: Payer: Self-pay | Admitting: Internal Medicine

## 2016-07-27 DIAGNOSIS — I483 Typical atrial flutter: Secondary | ICD-10-CM | POA: Diagnosis not present

## 2016-07-27 DIAGNOSIS — I5043 Acute on chronic combined systolic (congestive) and diastolic (congestive) heart failure: Secondary | ICD-10-CM | POA: Diagnosis not present

## 2016-07-27 DIAGNOSIS — J9621 Acute and chronic respiratory failure with hypoxia: Secondary | ICD-10-CM

## 2016-07-27 DIAGNOSIS — I110 Hypertensive heart disease with heart failure: Secondary | ICD-10-CM

## 2016-07-27 DIAGNOSIS — I5040 Unspecified combined systolic (congestive) and diastolic (congestive) heart failure: Secondary | ICD-10-CM | POA: Diagnosis not present

## 2016-07-27 DIAGNOSIS — F3110 Bipolar disorder, current episode manic without psychotic features, unspecified: Secondary | ICD-10-CM

## 2016-07-27 DIAGNOSIS — I2510 Atherosclerotic heart disease of native coronary artery without angina pectoris: Secondary | ICD-10-CM

## 2016-07-27 DIAGNOSIS — E890 Postprocedural hypothyroidism: Secondary | ICD-10-CM

## 2016-07-27 NOTE — Progress Notes (Signed)
MRN: [id number] Name: Tonya Hogan  Sex: female Age: 65 y.o. DOB: 1950-11-24  [place] #:  Facility/Room: [facility] / 112 P Level Of Care: SNF Provider: Noah Delaine. Sheppard Coil, MD Emergency Contacts: Extended Emergency Contact Information Primary Emergency Contact: Laurey Morale [place] of [place] Phone: [phone] Relation: Daughter Secondary Emergency Contact: Jimmy Footman [place] of [place] Phone: [phone] Relation: Niece  Code Status: Full Code  Allergies: Methimazole; Propylthiouracil; Chocolate; Other; Peanut-containing drug products; Penicillins; and Vicodin [hydrocodone-acetaminophen]  Chief Complaint  Patient presents with  . New Admit To SNF    Admit to Facility    HPI: Patient is 65 y.o. female with Bipolar DO, TIA, Obesity , HTN hypertension,, COPD, home o2, CAD native artery status post CABG, Chronic SystolicCHF, Atrial Flutter, cardioverted 8/25, Hypothyroidism who was admitted to [facility] form 8/28-9/2 for acute on chronic respiratory failure 2/2 acute on chronic CHF. Pt had just been d/c from hospital on 8/27 after having been treated for TIA and AF with RVR. PNA was ruled out as was acute cardiac event. Pt is admitted to SNF for ot/pt. While at SNF pt will be followed for Atrial flutter, tx with amiodarone and xarelto, bipolar dx, tx with depakote and and risperdal and HLD, tx with lipitor.   Past Medical History:  Diagnosis Date  . Arthritis   . Bipolar 1 disorder ([place])   . CAD (coronary artery disease)   . CHF (congestive heart failure) ([place])   . Depression   . Hypertension   . Hypothyroidism   . Obesity   . S/P CABG x 3   . Thyroid ca ([place])   . Thyroid disease     Past Surgical History:  Procedure Laterality Date  . ABDOMINAL HYSTERECTOMY    . CARDIOVERSION N/A 07/16/2016   Procedure: CARDIOVERSION;  Surgeon: Jerline Pain, MD;  Location: [place];  Service: Cardiovascular;  Laterality: N/A;  . CORONARY ARTERY BYPASS GRAFT     . TEE WITHOUT CARDIOVERSION N/A 07/16/2016   Procedure: TRANSESOPHAGEAL ECHOCARDIOGRAM (TEE);  Surgeon: Jerline Pain, MD;  Location: [facility];  Service: Cardiovascular;  Laterality: N/A;  . TONSILLECTOMY        Medication List       Accurate as of 07/27/16 11:59 PM. Always use your most recent med list.          albuterol 108 (90 Base) MCG/ACT inhaler Commonly known as:  PROVENTIL HFA;VENTOLIN HFA Inhale 2 puffs into the lungs every 6 (six) hours as needed for wheezing or shortness of breath.   amiodarone 200 MG tablet Commonly known as:  PACERONE Take 1 tablet (200 mg total) by mouth 2 (two) times daily.   aspirin 81 MG EC tablet Take 1 tablet (81 mg total) by mouth daily.   atorvastatin 20 MG tablet Commonly known as:  LIPITOR Take 1 tablet (20 mg total) by mouth at bedtime.   bisacodyl 5 MG EC tablet Commonly known as:  DULCOLAX Take 5 mg by mouth 2 (two) times daily as needed for mild constipation.   divalproex 500 MG 24 hr tablet Commonly known as:  DEPAKOTE ER Take 1,000 mg by mouth 2 (two) times daily.   ferrous sulfate 325 (65 FE) MG tablet Take 325 mg by mouth daily with breakfast.   Fish Oil 1000 MG Caps Take 1,000 mg by mouth daily.   furosemide 40 MG tablet Commonly known as:  LASIX Take 1 tablet (40 mg total) by mouth 2 (two) times daily.   gabapentin  600 MG tablet Commonly known as:  NEURONTIN Take 600 mg by mouth 2 (two) times daily.   levothyroxine 125 MCG tablet Commonly known as:  SYNTHROID, LEVOTHROID Take 125 mcg by mouth daily before breakfast.   OXYGEN Inhale 2 L into the lungs continuous.   polyethylene glycol packet Commonly known as:  MIRALAX / GLYCOLAX Take 17 g by mouth daily.   potassium chloride SA 20 MEQ tablet Commonly known as:  K-DUR,KLOR-CON Take 1 tablet (20 mEq total) by mouth daily.   RA ACETAMINOPHEN 650 MG CR tablet Generic drug:  acetaminophen Take 650 mg by mouth every 8 (eight) hours as needed for  pain. pain   risperiDONE 2 MG tablet Commonly known as:  RISPERDAL Take 2 mg by mouth at bedtime.   rivaroxaban 20 MG Tabs tablet Commonly known as:  XARELTO Take 1 tablet (20 mg total) by mouth daily with supper.   traMADol 50 MG tablet Commonly known as:  ULTRAM Take 1 tablet (50 mg total) by mouth every 8 (eight) hours as needed for severe pain.   zolpidem 5 MG tablet Commonly known as:  AMBIEN Take 5 mg by mouth at bedtime.       No orders of the defined types were placed in this encounter.   Immunization History  Administered Date(s) Administered  . PPD Test 07/24/2016    Social History  Substance Use Topics  . Smoking status: Never Smoker  . Smokeless tobacco: Never Used  . Alcohol use No    Family history is   Family History  Problem Relation Age of Onset  . Hypertension Sister   . Bipolar disorder Other       Review of Systems  DATA OBTAINED: from patient GENERAL:  no fevers, fatigue, appetite changes SKIN: No itching, or rash EYES: No eye pain, redness, discharge EARS: No earache, tinnitus, change in hearing NOSE: No congestion, drainage or bleeding  MOUTH/THROAT: No mouth or tooth pain, No sore throat RESPIRATORY: No cough, wheezing, SOB CARDIAC: No chest pain, palpitations, lower extremity edema  GI: No abdominal pain, No N/V/D or constipation, No heartburn or reflux  GU: No dysuria, frequency or urgency, or incontinence  MUSCULOSKELETAL: No unrelieved bone/joint pain NEUROLOGIC: No headache, dizziness or focal weakness PSYCHIATRIC: No c/o anxiety or sadness   Vitals:   07/27/16 0922  BP: 98/89  Pulse: 99  Resp: 18  Temp: 97.6 F (36.4 C)    SpO2 Readings from Last 1 Encounters:  07/24/16 99%        Physical Exam  GENERAL APPEARANCE: Alert, conversant,  No acute distress, very pleasant, very obese BF  SKIN: No diaphoresis rash HEAD: Normocephalic, atraumatic  EYES: Conjunctiva/lids clear. Pupils round, reactive. EOMs  intact.  EARS: External exam WNL, canals clear. Hearing grossly normal.  NOSE: No deformity or discharge.  MOUTH/THROAT: Lips w/o lesions  RESPIRATORY: Breathing is even, unlabored. Lung sounds are diffusely decreased  CARDIOVASCULAR: Heart RRR no murmurs, rubs or gallops. No peripheral edema.   GASTROINTESTINAL: Abdomen is soft, non-tender, not distended w/ normal bowel sounds. GENITOURINARY: Bladder non tender, not distended  MUSCULOSKELETAL: No abnormal joints or musculature NEUROLOGIC:  Cranial nerves 2-12 grossly intact. Moves all extremities  PSYCHIATRIC: Mood and affect appropriate to situation, no behavioral issues  Patient Active Problem List   Diagnosis Date Noted  . 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 Anemia, iron deficiency 07/22/2016  . PFO (patent foramen ovale) 07/22/2016  . HX: anticoagulation 07/22/2016  . Respiratory failure ([place]) 07/19/2016  . Hypoxia  07/19/2016  . Chronic respiratory failure with hypoxia ([place])   . Cerebral thrombosis with cerebral infarction 07/14/2016  . Transient cerebral ischemia   . Morbid obesity ([place])   . Acute on chronic combined systolic and diastolic congestive heart failure ([place])   . Atrial tachycardia ([place]) 07/13/2016  . Atrial flutter ([place]) 07/13/2016  . Slurred speech 07/13/2016  . Chronic diastolic heart failure ([place]) 11/05/2015  . Facial droop 10/06/2015  . Acute on chronic diastolic heart failure ([place]) 10/06/2015  . Acute on chronic respiratory failure with hypoxia ([place]) 10/06/2015  . CHF exacerbation ([place]) 10/06/2015  . Hypothyroidism   . CAD in native artery   . Bipolar affective disorder, manic ([place]) 07/08/2015  . COPD (chronic obstructive pulmonary disease) ([place]) 06/26/2015  . Acute pulmonary edema ([place]) 06/24/2015  . Hypertensive heart disease with congestive heart failure and with combined systolic and diastolic dysfunction ([place]) 06/23/2015  . Chronic anemia 06/23/2015  . Post-operative hypothyroidism 06/23/2015  . Shortness  of breath 06/23/2015  . Pulmonary edema 06/22/2015       Component Value Date/Time   WBC 6.1 07/24/2016 0330   RBC 3.76 (L) 07/24/2016 0330   HGB 9.1 (L) 07/24/2016 0330   HCT 32.8 (L) 07/24/2016 0330   PLT 275 07/24/2016 0330   MCV 87.2 07/24/2016 0330   LYMPHSABS 0.7 07/19/2016 1240   MONOABS 1.8 (H) 07/19/2016 1240   EOSABS 0.0 07/19/2016 1240   BASOSABS 0.0 07/19/2016 1240        Component Value Date/Time   NA 140 07/23/2016 1319   NA 140 07/23/2016 1319   K 3.7 07/23/2016 1319   K 3.7 07/23/2016 1319   CL 92 (L) 07/23/2016 1319   CO2 42 (H) 07/23/2016 1319   GLUCOSE 108 (H) 07/23/2016 1319   BUN 7 07/23/2016 1319   BUN 7 07/23/2016 1319   CREATININE 0.92 07/23/2016 1319   CREATININE 0.9 07/23/2016 1319   CALCIUM 8.7 (L) 07/23/2016 1319   PROT 7.5 07/19/2016 1240   ALBUMIN 2.9 (L) 07/19/2016 1240   AST 14 (L) 07/19/2016 1240   ALT 10 (L) 07/19/2016 1240   ALKPHOS 53 07/19/2016 1240   BILITOT 0.1 (L) 07/19/2016 1240   GFRNONAA >60 07/23/2016 1319   GFRAA >60 07/23/2016 1319    Lab Results  Component Value Date   HGBA1C 6.0 (H) 07/14/2016    Lab Results  Component Value Date   CHOL 190 07/14/2016   HDL 49 07/14/2016   LDLCALC 128 (H) 07/14/2016   TRIG 67 07/14/2016   CHOLHDL 3.9 07/14/2016     Dg Chest Port 1 View  Result Date: 07/19/2016 CLINICAL DATA:  Shortness of breath. EXAM: PORTABLE CHEST 1 VIEW COMPARISON:  07/13/2016 FINDINGS: Prior CABG. Mild cardiomegaly with vascular congestion. Low lung volumes. Linear areas of scarring or atelectasis in the lungs bilaterally. No effusions. No acute bony abnormality. IMPRESSION: Areas of mid lung atelectasis or scarring. Cardiomegaly, vascular congestion. Low lung volumes. Electronically Signed   By: Rolm Baptise M.D.   On: 07/19/2016 13:19    Not all labs, radiology exams or other studies done during hospitalization come through on my EPIC note; however they are reviewed by me.    Assessment and  Plan  Acute on chronic combined systolic and diastolic congestive heart failure (HCC) Treated with bipap and IV lasix, baseline bnp elevated;was diuresed 8.8 liters, baseline weight 260 lbs; troponin neg, ECG no changes SNF - notes state pt to continue Bblocker but was stopped in d/c meds ?,  possibly 2/2 lowish BP; will monitor; cont lasix 40 mg BID  Acute on chronic respiratory failure with hypoxia (HCC) 2/2 acute on chronic CHF; txed initially with bipap and IV antibiotics for possible PNA but antibiotics were stopped SNF - admitted with chronic O2 2L [address] for OT/PT  Atrial flutter (HCC) SNF - cont amiodarone 200 mg daily; as above stated for pt to be on bblocker whch were stopped on d/c meds; will monitor;prophylaxed with xarelto  CAD in native artery Neg troponins, no ECG changes;ASA is being held and bblocker 2/2 low BP SNF - cont statins  Hypertensive heart disease with congestive heart failure and with combined systolic and diastolic dysfunction (HCC) SNF - on lasix only 2/2 low BP; if this improves will put back on low dose bblocker  Bipolar affective disorder, manic (HCC) SNF - appears stable;plan to cont riserdal and depakote  Anemia, iron deficiency SNF - d/c Hb 9.1; wil f/u CBC, cont daily iron  Hypothyroidism SNF - TSH 0.535; cont synthroid 125 mcg daily  Morbid obesity ([place]) SNF - diet and exercise  Time spent > 45 min;> 50% of time with patient was spent reviewing records, labs, tests and studies, counseling and developing plan of care  Webb Silversmith D. Sheppard Coil, MD

## 2016-08-01 ENCOUNTER — Encounter: Payer: Self-pay | Admitting: Internal Medicine

## 2016-08-01 NOTE — Assessment & Plan Note (Signed)
SNF - d/c Hb 9.1; wil f/u CBC, cont daily iron

## 2016-08-01 NOTE — Assessment & Plan Note (Signed)
SNF - appears stable;plan to cont riserdal and depakote

## 2016-08-01 NOTE — Assessment & Plan Note (Addendum)
Treated with bipap and IV lasix, baseline bnp elevated;was diuresed 8.8 liters, baseline weight 260 lbs; troponin neg, ECG no changes SNF - notes state pt to continue Bblocker but was stopped in d/c meds ?, possibly 2/2 lowish BP; will monitor; cont lasix 40 mg BID

## 2016-08-01 NOTE — Assessment & Plan Note (Signed)
SNF - cont amiodarone 200 mg daily; as above stated for pt to be on bblocker whch were stopped on d/c meds; will monitor;prophylaxed with xarelto

## 2016-08-01 NOTE — Assessment & Plan Note (Signed)
SNF - TSH 0.535; cont synthroid 125 mcg daily

## 2016-08-01 NOTE — Assessment & Plan Note (Signed)
Neg troponins, no ECG changes;ASA is being held and bblocker 2/2 low BP SNF - cont statins

## 2016-08-01 NOTE — Assessment & Plan Note (Signed)
SNF - on lasix only 2/2 low BP; if this improves will put back on low dose bblocker

## 2016-08-01 NOTE — Assessment & Plan Note (Signed)
SNF - diet and exercise

## 2016-08-01 NOTE — Assessment & Plan Note (Signed)
2/2 acute on chronic CHF; txed initially with bipap and IV antibiotics for possible PNA but antibiotics were stopped SNF - admitted with chronic O2 2L [place] for OT/PT

## 2016-08-03 ENCOUNTER — Ambulatory Visit (INDEPENDENT_AMBULATORY_CARE_PROVIDER_SITE_OTHER): Payer: Medicare Other | Admitting: Physician Assistant

## 2016-08-03 ENCOUNTER — Encounter: Payer: Self-pay | Admitting: *Deleted

## 2016-08-03 ENCOUNTER — Encounter: Payer: Self-pay | Admitting: Physician Assistant

## 2016-08-03 VITALS — BP 90/60 | HR 102 | Ht 60.0 in | Wt 276.4 lb

## 2016-08-03 DIAGNOSIS — I10 Essential (primary) hypertension: Secondary | ICD-10-CM | POA: Insufficient documentation

## 2016-08-03 DIAGNOSIS — I5040 Unspecified combined systolic (congestive) and diastolic (congestive) heart failure: Secondary | ICD-10-CM

## 2016-08-03 DIAGNOSIS — I471 Supraventricular tachycardia: Secondary | ICD-10-CM | POA: Diagnosis not present

## 2016-08-03 DIAGNOSIS — Z7901 Long term (current) use of anticoagulants: Secondary | ICD-10-CM

## 2016-08-03 DIAGNOSIS — I110 Hypertensive heart disease with heart failure: Secondary | ICD-10-CM | POA: Diagnosis not present

## 2016-08-03 DIAGNOSIS — I484 Atypical atrial flutter: Secondary | ICD-10-CM

## 2016-08-03 DIAGNOSIS — Z951 Presence of aortocoronary bypass graft: Secondary | ICD-10-CM | POA: Insufficient documentation

## 2016-08-03 NOTE — Patient Instructions (Addendum)
Medication Instructions:  Increase lasix to 60mg  (1.5 of a 40mg  tablet) by mouth two times a day for 3 days, then decrease lasix back to 40mg  (1 tablet) two times a day.  Increase KCL (potassium) to 30 mEq (1.5 tablets) by mouth daily for 3 days, then decrease KCL (potassium) to 20 mEq daily  Stop Tramadol. This may cause fluid retention.     Labwork: BMET/CBCd today  Testing/Procedures: Your physician has recommended that you have a Cardioversion (DCCV). Electrical Cardioversion uses a jolt of electricity to your heart either through paddles or wired patches attached to your chest. This is a controlled, usually prescheduled, procedure. Defibrillation is done under light anesthesia in the hospital, and you usually go home the day of the procedure. This is done to get your heart back into a normal rhythm. You are not awake for the procedure. Please see the instruction sheet given to you today.  This is scheduled for Monday September 18,2017 with Dr Meda Coffee   I spoke with Loletha Carrow at [facility], booking number [phone]. In Basket message to pre -cert [phone].  Follow-Up: You have a follow up appointment schedule this Thursday August 05, 2016 at 9:20AM with Darrick Grinder, NP in the [facility] at [facility] [phone]   Any Other Special Instructions Will Be Listed Below (If Applicable). Your physician recommends that you weigh, daily, at the same time every day, and in the same amount of clothing. Please record your daily weights and  bring it to your next appointment.  If you can more than 3 pounds in one day or 5 pounds in one week, call our office.  Use knee high compression stockings to help the swelling in your feet and legs.. Put them on when you get up in the morning and take them off when you go to bed at night.       If you need a refill on your cardiac medications before your next appointment, please call your pharmacy.

## 2016-08-03 NOTE — Progress Notes (Signed)
Cardiology Office Note    Date:  08/03/2016   ID:  Tonya Hogan, DOB 1951/04/21, MRN [id number]  PCP:  No PCP Per Patient  Cardiologist:  CHF clinic, Darrick Grinder, NP   Chief Complaint: Hospital follow up s/p recent DCCV  History of Present Illness:   Tonya Hogan is a 65 y.o. female HTN, bipolar disorder, h/o noncompliance, COPD, chronic anemia, chronic diastolic heart failure, thyroid CA --> hypothyroidism, resection of an atrial myxoma and repair of PFO 01/31/2010 at [facility],  recent TEE cardioversion 8/25 for aflutter in setting of TIA--> discharged 8/27 however again admitted 8/28-9/2 acute on chronic respiratory failure 2/2 acute on chronic CHF who presented for follow up.    Per Dr. Sallyanne Kuster  Consult note 07/19/16--> "the diagnosis of coronary artery disease and bypass surgery is erroneous. The limited records available from [facility] describe previous resection of an atrial myxoma and repair of PFO. Review of the CT of her chest does not show any obvious bypass grafts. There is also minimal atherosclerosis in the aortic arch and no visible calcification in the distribution of the coronary arteries. The same records mention the fact that she underwent right heart catheterization and coronary angiography on 01/30/2010, prior to thyroid surgery. A notes dated 01/27/2010 rate counts "patent coronary arteries" on that study. The same records from [facility] in 2011 document cardioversion for postoperative atrial flutter and subsequent anticoagulation for atrial flutter with rapid ventricular response. As far as I can glean from these limited records, the plan was to continue anticoagulation for only 6 weeks, probably estimating that the arrhythmia might have been only a postoperative phenomenon. I have removed the diagnoses".  Seen in ER 06/30/2016 for mania and transferred to [facility] for psych treatment. ECG clearly  SR.  Admitted 08/22 w/ slurred speech, ?TIA vs CVA.  MRI of the brain obtained on 07/14/2016 showed no evidence of acute or subacute infarct or new intracranial hemorrhage. Echocardiogram obtained on 07/14/2016, however showed EF 30-35%. He was also noted to be in 2-1 atrial flutter with RVR. She was placed on Xarelto given CHA2DS2-Vasc score of 5-6 (HTN, ?CAD, TIA/CVAx2, CHF, female). She eventually underwent successful TEE cardioversion on 07/16/2016 with restoration of normal sinus rhythm.  She was discharged on 07/18/16.   However presented 07/19/16 with chest pain, AMS and respiratory distress initially requiring BiPAP. Felt 2/2 to CHF. Total diuresis of 8.8L. Pt had probable hemorrhoids bleed and treated with hydrocortisone suppository. ASA stopped, continued Xarelto. Discontinued Toprol-xl 25mg  and lisinopril 10mg  during admission due to soft BP.   Here today for follow up. Feeling well. Still having LE edema. No orthopnea, PND, syncope, chest pain, palpitation, melena or blood in her stool or urine. Compliant with medications.   Past Medical History:  Diagnosis Date  . Arthritis   . Bipolar 1 disorder ([place])   . CAD (coronary artery disease)   . CHF (congestive heart failure) ([place])   . Depression   . Hypertension   . Hypothyroidism   . Obesity   . S/P CABG x 3   . Thyroid ca ([place])   . Thyroid disease     Past Surgical History:  Procedure Laterality Date  . ABDOMINAL HYSTERECTOMY    . CARDIOVERSION N/A 07/16/2016   Procedure: CARDIOVERSION;  Surgeon: Jerline Pain, MD;  Location: [place];  Service: Cardiovascular;  Laterality: N/A;  . CORONARY ARTERY BYPASS GRAFT    . TEE WITHOUT CARDIOVERSION N/A 07/16/2016   Procedure: TRANSESOPHAGEAL ECHOCARDIOGRAM (TEE);  Surgeon: Jerline Pain, MD;  Location: [facility] END[facility]COPY;  Service: Cardiovascular;  Laterality: N/A;  . TONSILLECTOMY      Current Medications: Prior to Admission medications   Medication Sig Start Date End Date Taking? Authorizing Provider  acetaminophen (RA ACETAMINOPHEN) 650  MG CR tablet Take 650 mg by mouth every 8 (eight) hours as needed for pain. pain 03/16/13   Historical Provider, MD  albuterol (PROVENTIL HFA;VENTOLIN HFA) 108 (90 BASE) MCG/ACT inhaler Inhale 2 puffs into the lungs every 6 (six) hours as needed for wheezing or shortness of breath. 06/26/15   Donne Hazel, MD  amiodarone (PACERONE) 200 MG tablet Take 1 tablet (200 mg total) by mouth 2 (two) times daily. 07/18/16   Barton Dubois, MD  aspirin EC 81 MG EC tablet Take 1 tablet (81 mg total) by mouth daily. 07/18/16   Barton Dubois, MD  atorvastatin (LIPITOR) 20 MG tablet Take 1 tablet (20 mg total) by mouth at bedtime. 07/18/16   Barton Dubois, MD  bisacodyl (DULCOLAX) 5 MG EC tablet Take 5 mg by mouth 2 (two) times daily as needed for mild constipation.    Historical Provider, MD  divalproex (DEPAKOTE ER) 500 MG 24 hr tablet Take 1,000 mg by mouth 2 (two) times daily.    Historical Provider, MD  ferrous sulfate 325 (65 FE) MG tablet Take 325 mg by mouth daily with breakfast.    Historical Provider, MD  furosemide (LASIX) 40 MG tablet Take 1 tablet (40 mg total) by mouth 2 (two) times daily. 07/24/16   Belkys A Regalado, MD  gabapentin (NEURONTIN) 600 MG tablet Take 600 mg by mouth 2 (two) times daily.    Historical Provider, MD  levothyroxine (SYNTHROID, LEVOTHROID) 125 MCG tablet Take 125 mcg by mouth daily before breakfast.    Historical Provider, MD  Omega-3 Fatty Acids (FISH OIL) 1000 MG CAPS Take 1,000 mg by mouth daily.    Historical Provider, MD  OXYGEN Inhale 2 L into the lungs continuous.     Historical Provider, MD  polyethylene glycol (MIRALAX / GLYCOLAX) packet Take 17 g by mouth daily. 07/24/16   Belkys A Regalado, MD  potassium chloride SA (K-DUR,KLOR-CON) 20 MEQ tablet Take 1 tablet (20 mEq total) by mouth daily. 10/09/15   Geradine Girt, DO  risperiDONE (RISPERDAL) 2 MG tablet Take 2 mg by mouth at bedtime.    Historical Provider, MD  rivaroxaban (XARELTO) 20 MG TABS tablet Take 1 tablet (20 mg  total) by mouth daily with supper. 07/18/16   Barton Dubois, MD  traMADol (ULTRAM) 50 MG tablet Take 1 tablet (50 mg total) by mouth every 8 (eight) hours as needed for severe pain. 07/24/16   Belkys A Regalado, MD  zolpidem (AMBIEN) 5 MG tablet Take 5 mg by mouth at bedtime.    Historical Provider, MD    Allergies:   Methimazole; Propylthiouracil; Chocolate; Other; Peanut-containing drug products; Penicillins; and Vicodin [hydrocodone-acetaminophen]   Social History   Social History  . Marital status: Widowed    Spouse name: N/A  . Number of children: N/A  . Years of education: N/A   Occupational History  . Retired    Social History Main Topics  . Smoking status: Never Smoker  . Smokeless tobacco: Never Used  . Alcohol use No  . Drug use: No  . Sexual activity: No   Other Topics Concern  . None   Social History Narrative   Has a daughter in the area.  Family History:  The patient's family history includes Bipolar disorder in her other; Hypertension in her sister.  ROS:   Please see the history of present illness.    ROS All other systems reviewed and are negative.   PHYSICAL EXAM:   VS:  BP 90/60   Pulse (!) 102   Ht 5' (1.524 m)   Wt 276 lb 6.4 oz (125.4 kg)   SpO2 (!) 84%   BMI 53.98 kg/m    GEN: Well nourished, well developed, in no acute distress  HEENT: normal  Neck: ? JVD, carotid bruits, or masses Cardiac: RR with tachycardia; no murmurs, rubs, or gallops 1-2+ BL Le edema  Respiratory:  clear to auscultation bilaterally, normal work of breathing GI: soft, nontender, nondistended, + BS MS: no deformity or atrophy  Skin: warm and dry, no rash Neuro:  Alert and Oriented x 3, Strength and sensation are intact Psych: euthymic mood, full affect  Wt Readings from Last 3 Encounters:  08/03/16 276 lb 6.4 oz (125.4 kg)  07/27/16 269 lb 6.4 oz (122.2 kg)  07/24/16 268 lb 3.2 oz (121.7 kg)      Studies/Labs Reviewed:   EKG:  EKG is ordered today.  The  ekg ordered today demonstrates aflutter at rate of 102bpm  Recent Labs: 07/13/2016: TSH 0.535 07/19/2016: ALT 10; B Natriuretic Peptide 348.4 07/20/2016: Magnesium 2.0 07/23/2016: BUN 7; BUN 7; Creatinine 0.9; Creatinine, Ser 0.92; Potassium 3.7; Potassium 3.7; Sodium 140; Sodium 140 07/24/2016: Hemoglobin 9.1; Platelets 275   Lipid Panel    Component Value Date/Time   CHOL 190 07/14/2016 0324   TRIG 67 07/14/2016 0324   HDL 49 07/14/2016 0324   CHOLHDL 3.9 07/14/2016 0324   VLDL 13 07/14/2016 0324   LDLCALC 128 (H) 07/14/2016 0324    Additional studies/ records that were reviewed today include:   Transesophageal Echocardiography 07/16/16  LV EF: 35% -   40%  ------------------------------------------------------------------- Indications:      Atrial flutter 427.32.  ------------------------------------------------------------------- History:   PMH:  CABG  Coronary artery disease.  Congestive heart failure.  Risk factors: Hypertension.  ------------------------------------------------------------------- Study Conclusions  - Left ventricle: Systolic function was moderately reduced. The   estimated ejection fraction was in the range of 35% to 40%.   Diffuse hypokinesis. - Aortic valve: No evidence of vegetation. - Mitral valve: No evidence of vegetation. There was mild   regurgitation. - Left atrium: No evidence of thrombus in the atrial cavity or   appendage. No evidence of thrombus in the appendage. - Right atrium: The atrium was dilated. No evidence of thrombus in   the atrial cavity or appendage. - Tricuspid valve: No evidence of vegetation. - Pulmonic valve: No evidence of vegetation.   Transthoracic Echocardiography 07/14/16 LV EF: 30% -   35%  ------------------------------------------------------------------- Indications:      Atrial flutter 427.32.  ------------------------------------------------------------------- History:   PMH:   Coronary artery  disease.  Congestive heart failure.  Risk factors:  Hypertension.  ------------------------------------------------------------------- Study Conclusions  - Left ventricle: The cavity size was normal. Wall thickness was   normal. Systolic function was moderately to severely reduced. The   estimated ejection fraction was in the range of 30% to 35%.  Impressions:  - The echo is technically very difficult due to the patients body   habitus.   The LV is not seen well. The LV function appears to be at least   moderately reduced  ASSESSMENT & PLAN:    1. Atrial flutter S/P recent  TEE DCCV 07/16/16. -  Back in atrial flutter today at rate of 102 bpm. Continue Amio and Xarelto. She hasn't missed any dose of Xarelto. Discussed with DOD Dr. Curt Bears. Will set up for first available cardioversion.   2. Chronic systolic CHF - Ef of 123456 on TEE 07/16/16. Baseline weight around 260-265 lb. Discharge weight was 269lb. Wt of 276 lb today. Discontinued toprol-xl 25mg  and lisinopril 10mg  during admission due to soft BP. Will increase lasix to 60mg  BID x 3 days. Unable to add BB or ACE due to soft BP. Likely her symptoms is due to tachycardia related. BEMT and CBC today. F/u in CHF clinic this week for possible addition of digoxin. Will need repeat echo in 3 months. Try compression stocking. Low sodium diet. Loose weight.    *Weigh yourself on the same scale at same time of day and keep a log. *Report weight gain of > 3 lbs in 1 day or 5 lbs over the course of a week and/or symptoms of excess fluid (shortness of breath, difficulty lying flat, swelling, poor appetite, abdominal fullness/bloating, etc) to your doctor immediately. *Avoid foods that are high in sodium (processed, pre-packaged/canned goods, fast foods, etc). 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 No CABG   4. PFO (patent  foramen ovale) - The patient had a PFO repaired at the time that the atrial myxoma was removed at [facility].  5. Chronic anticoagulation - The patient needs anticoagulation. Recent TEE cardioversion. At that time the TEE showed no recurrent myxoma. There was no atrial appendage clot. However the patient did have some facial drooping recently. We need to do everything possible to continue her anticoagulation if possible. Still has some R sided arm weakness. Continue Xarelto. Consider discontinuation of aspirin as no hx of CABG as above.   6. HTN - As above  7. Anemia - No melena or blood in stool. CBC today.     Medication Adjustments/Labs and Tests Ordered: Current medicines are reviewed at length with the patient today.  Concerns regarding medicines are outlined above.  Medication changes, Labs and Tests ordered today are listed in the Patient Instructions below. Patient Instructions  Medication Instructions:  Increase lasix to 60mg  (1.5 of a 40mg  tablet) by mouth two times a day for 3 days, then decrease lasix back to 40mg  (1 tablet) two times a day.  Increase KCL (potassium) to 30 mEq (1.5 tablets) by mouth daily for 3 days, then decrease KCL (potassium) to 20 mEq daily  Stop Tramm     Labwork: BMET/CBCd today  Testing/Procedures: Your physician has recommended that you have a Cardioversion (DCCV). Electrical Cardioversion uses a jolt of electricity to your heart either through paddles or wired patches attached to your chest. This is a controlled, usually prescheduled, procedure. Defibrillation is done under light anesthesia in the hospital, and you usually go home the day of the procedure. This is done to get your heart back into a normal rhythm. You are not awake for the procedure. Please see the instruction sheet given to you today.  This is scheduled for Monday September 18,2017 with Dr Meda Coffee  Follow-Up:  You have a follow up appointment schedule this Thursday August 05, 2016 at 9:20AM with Darrick Grinder, NP in the [facility] at [facility]. [phone]   Any Other Special Instructions Will Be Listed Below (If Applicable). Your physician recommends  that you weigh, daily, at the same time every day, and in the same amount of clothing. Please record your daily weights and  bring it to your next appointment.  If you can more than 3 pounds in one day or 5 pounds in one week, call our office.  Use knee high compression stockings to help the swelling in your feet and legs.. Put them on when you get up in the morning and take them off when you go to bed at night.       If you need a refill on your cardiac medications before your next appointment, please call your pharmacy.      Jarrett Soho, [place]  08/03/2016 4:51 PM    [facility] [place], [place], [place]  [address] Phone: [phone]; Fax: [phone]

## 2016-08-04 LAB — CBC WITH DIFFERENTIAL/PLATELET
BASOS PCT: 0 %
Basophils Absolute: 0 cells/uL (ref 0–200)
Eosinophils Absolute: 276 cells/uL (ref 15–500)
Eosinophils Relative: 4 %
HEMATOCRIT: 33.1 % — AB (ref 35.0–45.0)
HEMOGLOBIN: 9.8 g/dL — AB (ref 11.7–15.5)
LYMPHS ABS: 1656 {cells}/uL (ref 850–3900)
Lymphocytes Relative: 24 %
MCH: 25 pg — ABNORMAL LOW (ref 27.0–33.0)
MCHC: 29.6 g/dL — AB (ref 32.0–36.0)
MCV: 84.4 fL (ref 80.0–100.0)
MONO ABS: 1242 {cells}/uL — AB (ref 200–950)
MPV: 10 fL (ref 7.5–12.5)
Monocytes Relative: 18 %
NEUTROS ABS: 3726 {cells}/uL (ref 1500–7800)
NEUTROS PCT: 54 %
Platelets: 258 10*3/uL (ref 140–400)
RBC: 3.92 MIL/uL (ref 3.80–5.10)
RDW: 19.4 % — ABNORMAL HIGH (ref 11.0–15.0)
WBC: 6.9 10*3/uL (ref 3.8–10.8)

## 2016-08-04 LAB — BASIC METABOLIC PANEL
BUN: 20 mg/dL (ref 7–25)
CO2: 35 mmol/L — AB (ref 20–31)
Calcium: 8.9 mg/dL (ref 8.6–10.4)
Chloride: 97 mmol/L — ABNORMAL LOW (ref 98–110)
Creat: 1.18 mg/dL — ABNORMAL HIGH (ref 0.50–0.99)
GLUCOSE: 103 mg/dL — AB (ref 65–99)
POTASSIUM: 4.1 mmol/L (ref 3.5–5.3)
SODIUM: 142 mmol/L (ref 135–146)

## 2016-08-05 ENCOUNTER — Encounter (HOSPITAL_COMMUNITY): Payer: Self-pay

## 2016-08-05 ENCOUNTER — Ambulatory Visit (HOSPITAL_COMMUNITY)
Admission: RE | Admit: 2016-08-05 | Discharge: 2016-08-05 | Disposition: A | Discharge status: Home / Self Care | Payer: Medicare Other | Source: Ambulatory Visit | Attending: Cardiology | Admitting: Cardiology

## 2016-08-05 VITALS — BP 118/64 | HR 104 | Wt 267.0 lb

## 2016-08-05 DIAGNOSIS — I5032 Chronic diastolic (congestive) heart failure: Secondary | ICD-10-CM | POA: Diagnosis not present

## 2016-08-05 DIAGNOSIS — Z8673 Personal history of transient ischemic attack (TIA), and cerebral infarction without residual deficits: Secondary | ICD-10-CM | POA: Diagnosis not present

## 2016-08-05 DIAGNOSIS — E039 Hypothyroidism, unspecified: Secondary | ICD-10-CM | POA: Diagnosis not present

## 2016-08-05 DIAGNOSIS — E669 Obesity, unspecified: Secondary | ICD-10-CM | POA: Diagnosis not present

## 2016-08-05 DIAGNOSIS — I484 Atypical atrial flutter: Secondary | ICD-10-CM | POA: Insufficient documentation

## 2016-08-05 DIAGNOSIS — M1990 Unspecified osteoarthritis, unspecified site: Secondary | ICD-10-CM | POA: Diagnosis not present

## 2016-08-05 DIAGNOSIS — J449 Chronic obstructive pulmonary disease, unspecified: Secondary | ICD-10-CM | POA: Insufficient documentation

## 2016-08-05 DIAGNOSIS — Z8249 Family history of ischemic heart disease and other diseases of the circulatory system: Secondary | ICD-10-CM | POA: Diagnosis not present

## 2016-08-05 DIAGNOSIS — I2583 Coronary atherosclerosis due to lipid rich plaque: Secondary | ICD-10-CM

## 2016-08-05 DIAGNOSIS — D649 Anemia, unspecified: Secondary | ICD-10-CM | POA: Diagnosis not present

## 2016-08-05 DIAGNOSIS — Z818 Family history of other mental and behavioral disorders: Secondary | ICD-10-CM | POA: Insufficient documentation

## 2016-08-05 DIAGNOSIS — Z6841 Body Mass Index (BMI) 40.0 and over, adult: Secondary | ICD-10-CM | POA: Insufficient documentation

## 2016-08-05 DIAGNOSIS — Z951 Presence of aortocoronary bypass graft: Secondary | ICD-10-CM | POA: Diagnosis not present

## 2016-08-05 DIAGNOSIS — Z7902 Long term (current) use of antithrombotics/antiplatelets: Secondary | ICD-10-CM | POA: Diagnosis not present

## 2016-08-05 DIAGNOSIS — Z7982 Long term (current) use of aspirin: Secondary | ICD-10-CM | POA: Diagnosis not present

## 2016-08-05 DIAGNOSIS — F319 Bipolar disorder, unspecified: Secondary | ICD-10-CM | POA: Diagnosis not present

## 2016-08-05 DIAGNOSIS — I110 Hypertensive heart disease with heart failure: Secondary | ICD-10-CM | POA: Insufficient documentation

## 2016-08-05 DIAGNOSIS — Z85850 Personal history of malignant neoplasm of thyroid: Secondary | ICD-10-CM | POA: Diagnosis not present

## 2016-08-05 DIAGNOSIS — R0600 Dyspnea, unspecified: Secondary | ICD-10-CM | POA: Insufficient documentation

## 2016-08-05 DIAGNOSIS — I2510 Atherosclerotic heart disease of native coronary artery without angina pectoris: Secondary | ICD-10-CM | POA: Diagnosis not present

## 2016-08-05 MED ORDER — METOPROLOL SUCCINATE ER 25 MG PO TB24: 25.0000 mg | ORAL_TABLET | Freq: Every day | ORAL | 6 refills | Status: DC

## 2016-08-05 NOTE — Progress Notes (Cosign Needed)
Patient ID: Tonya Hogan, female   DOB: [birth year], 65 y.o.   MRN: [id number]  PCP: None  Primary Cardiologist: None   HPI: Tonya Hogan is a 65 y.o. female with PMH of hypertension, thyroid cancer, hypothyroidism, CAD, s/p CABG 01/31/2010, bipolar, obesity, COPD, chronic anemia, diastolic congestive heart failure with EF 55-60% and grade 2 diastolic dysfunction, depression, and bipolar disorder.   Admit 11/14 - 17/16 with volume overload and facial droop. Neurologic work up was negative. Diuresed with IV lasix and transitioned to lasix 40 mg twice a day. Discharge weight 257 pounds.   Admitted 8/22 - 07/18/2016 with slurred speech and right sided facial droop that resolved in Er. Was found to be in atrial flutter with RVR. Cardizem gtt started but was stopped d/t drop in BP. Lisinopril and BB were stopped in the hospital d/t hypotension. Was cardioverted on 07/16/16   Admitted 8/28 - 07/24/16 with respiratory failure. Was found to be hypoxic with SPO2 in the 80s and CXR with B/L infiltrates. Required BiPAP and 5 L O2. Was started on Vanc/Zosyn short term and was D/C'd as her presentation was r/t CHF exacerbation. ACEi and BB not restarted at discharge with D/C weight 269 lbs.   ECHO 06/2015: EF 55 - 60% ECHO 09/2015: EF 'normal' ECHO 06/2016: EF 35 - 40%, diffuse HK  BMET 10/09/15 K 4.0 Creatinine 0.77 BMET 10/21/2015: K 4.4 Creatinine 0.98 BNP 152.  LABS: 07/22/16: K 3.6 Creatinine 0.81 LABS: 08/03/16: K 4.1 Creatinine 1.18  At [facility] for PT/OT after TIA work up. Overall feeling a lot better since going home. Reports she is doing rehab 3x a day. Walks only occasionally with walker assistance and spends most of her time in the w/c. On 2LPM O2 all the time. Reports weight around 270 - 272 pounds. Sleeping in a hospital bed with [facility] about 45deg. Denies PND/CP/Palpitations. Denies dyspnea at rest and reports activities are limited primarily by SOB. Wearing compression stockings  today.    ROS: All systems negative except as listed in HPI, PMH and Problem List.  SH:  Social History   Social History  . Marital status: Widowed    Spouse name: N/A  . Number of children: N/A  . Years of education: N/A   Occupational History  . Retired    Social History Main Topics  . Smoking status: Never Smoker  . Smokeless tobacco: Never Used  . Alcohol use No  . Drug use: No  . Sexual activity: No   Other Topics Concern  . Not on file   Social History Narrative   Has a daughter in the area.    FH:  Family History  Problem Relation Age of Onset  . Hypertension Sister   . Bipolar disorder Other     Past Medical History:  Diagnosis Date  . Arthritis   . Atrial flutter ([place])    S/p TEE DCCV 07/16/16  . Bipolar 1 disorder ([place])   . CAD (coronary artery disease)   . CHF (congestive heart failure) ([place])   . Depression   . Hypertension   . Hypothyroidism   . Obesity   . S/P CABG x 3   . Thyroid ca ([place])   . Thyroid disease   . TIA (transient ischemic attack) 07/13/2016    Current Outpatient Prescriptions  Medication Sig Dispense Refill  . acetaminophen (RA ACETAMINOPHEN) 650 MG CR tablet Take 650 mg by mouth every 8 (eight) hours as needed for pain. pain    .  albuterol (PROVENTIL HFA;VENTOLIN HFA) 108 (90 BASE) MCG/ACT inhaler Inhale 2 puffs into the lungs every 6 (six) hours as needed for wheezing or shortness of breath. 1 Inhaler 0  . amiodarone (PACERONE) 200 MG tablet Take 1 tablet (200 mg total) by mouth 2 (two) times daily. 60 tablet 1  . aspirin EC 81 MG EC tablet Take 1 tablet (81 mg total) by mouth daily. 30 tablet 1  . atorvastatin (LIPITOR) 20 MG tablet Take 1 tablet (20 mg total) by mouth at bedtime. 30 tablet 1  . bisacodyl (DULCOLAX) 5 MG EC tablet Take 5 mg by mouth 2 (two) times daily as needed for mild constipation.    . divalproex (DEPAKOTE ER) 500 MG 24 hr tablet Take 1,000 mg by mouth 2 (two) times daily.    . ferrous sulfate 325 (65  FE) MG tablet Take 325 mg by mouth daily with breakfast.    . furosemide (LASIX) 40 MG tablet Take 1 tablet (40 mg total) by mouth 2 (two) times daily. 30 tablet 0  . gabapentin (NEURONTIN) 600 MG tablet Take 600 mg by mouth 2 (two) times daily.    Marland Kitchen levothyroxine (SYNTHROID, LEVOTHROID) 125 MCG tablet Take 125 mcg by mouth daily before breakfast.    . Omega-3 Fatty Acids (FISH OIL) 1000 MG CAPS Take 1,000 mg by mouth daily.    . OXYGEN Inhale 2 L into the lungs continuous.     . polyethylene glycol (MIRALAX / GLYCOLAX) packet Take 17 g by mouth daily. 14 each 0  . potassium chloride SA (K-DUR,KLOR-CON) 20 MEQ tablet Take 1 tablet (20 mEq total) by mouth daily. 30 tablet 0  . risperiDONE (RISPERDAL) 2 MG tablet Take 2 mg by mouth at bedtime.    . rivaroxaban (XARELTO) 20 MG TABS tablet Take 1 tablet (20 mg total) by mouth daily with supper. 30 tablet 1  . zolpidem (AMBIEN) 5 MG tablet Take 5 mg by mouth at bedtime.     No current facility-administered medications for this encounter.     Vitals:   08/05/16 1400  BP: 118/64  Pulse: (!) 104  SpO2: 93%  Weight: 267 lb (121.1 kg)    PHYSICAL EXAM: General:  Well appearing. No resp difficulty and seated comfortably in wheelchair.  HEENT: normal  Neck: supple. JVP 8-9. +HJR. Carotids 2+ bilaterally; no bruits. No lymphadenopathy or thryomegaly appreciated. Cor: PMI normal. Regular rate & rhythm. No rubs, gallops or murmurs. Lungs: clear bilaterally Abdomen: obese, soft, nontender, nondistended. No hepatosplenomegaly. No bruits or masses. Good bowel sounds. Extremities: no cyanosis, clubbing, rash. 2+ peripheral edema b/l up to thighs. Neuro: alert & orientedx3, cranial nerves grossly intact. Moves all 4 extremities w/o difficulty. Affect pleasant.   ASSESSMENT & PLAN: 1. Chronic Diastolic Heart Failure. ECHO 06/24/2015 EF 55-60%. Grade II  DD.  NYHA III. Feeling better since D/C from hospital after 2 back to back admissions. Volume status  elevated on exam.   -Increase lasix to 80 mg BID with 20 KCL BID until cardioversion Monday 9/18 then decrease back to 40 mg BID with 20 KCL QD.   -Reinforced drinking less fluids with max 2L daily.  -Continue low sodium diet.  2. HTN- improved since hospitalization. Resume Toprol XL 25mg  QD. Continue to hold lisinopril for now.  3. CAD H/O CABG - no evidence of ischemia. Continue aspirin and atorvastatin.  4. H/O Bipolar Disorder- Currently managed by SNF. Needs PCP for ongoing management.  5. Hypothyroidism- Continue synthroid. Currently managed by SNF.  Needs PCP for ongoing management.   Follow up in 2 weeks with APP after she has had DCCV 9/18.      [place]  Student FNP   2:29 PM

## 2016-08-05 NOTE — Patient Instructions (Addendum)
START Toprol XL 25 mg tablet once daily.  For the next 3 days (Tomorrow, Saturday, Sunday)... INCREASE Lasix to 40 mg (2 tabs) twice daily INCREASE Potassium to 20 meq (1 tab) twice daily  Then on Monday 08/09/16... Reduce Lasix back down to normal dose: 40 mg (1 tab) twice daily. Reduce potassium back down to normal dose: 20 meq (1 tab) once daily.  Focus on drinking less fluids throughout the day. Don't sip on bottles of water all day.   Follow up 2 weeks with Tonya Clegg NP-C.  Do the following things EVERYDAY: 1) Weigh yourself in the morning before breakfast. Write it down and keep it in a log. 2) Take your medicines as prescribed 3) Eat low salt foods-Limit salt (sodium) to 2000 mg per day.  4) Stay as active as you can everyday 5) Limit all fluids for the day to less than 2 liters

## 2016-08-05 NOTE — Progress Notes (Signed)
Patient ID: Kasy Doke, female   DOB: Jun 18, 1951, 65 y.o.   MRN: [id number]  PCP: None  Primary Cardiologist: None   HPI: Abigel Karst is a 65 y.o. female with PMH of hypertension, thyroid cancer, hypothyroidism, CAD, s/p CABG 01/31/2010, bipolar, obesity, COPD, chronic anemia, diastolic congestive heart failure with EF 55-60% and grade 2 diastolic dysfunction, depression, and bipolar disorder.   She has had 2 hospital admits in the last 4 months for volume overload. Most recent admit 11/14 through 10/09/15 with volume overload and facial droop. Neurologic work up was negative. Diuresed with IV lasix and transitioned to lasix 40 mg twice a day. Discharge weight 257 pounds.   Today she returns for follow. Last visit she was asked to take lasix 40 mg po twice a day. She also had issues with noncompliance. Now she says she is taking all medications. Dyspnea with exertion. +Orthopnea.Weight has been going up.  Following low salt diet. Drinking > 2 liters. Currently at SNF.    BMET 10/09/15 K 4.0 Creatinine 0.77 BMET 10/21/2015: K 4.4 Creatinine 0.98 BNP 152.    ROS: All systems negative except as listed in HPI, PMH and Problem List.  SH:  Social History   Social History  . Marital status: Widowed    Spouse name: N/A  . Number of children: N/A  . Years of education: N/A   Occupational History  . Retired    Social History Main Topics  . Smoking status: Never Smoker  . Smokeless tobacco: Never Used  . Alcohol use No  . Drug use: No  . Sexual activity: No   Other Topics Concern  . Not on file   Social History Narrative   Has a daughter in the area.    FH:  Family History  Problem Relation Age of Onset  . Hypertension Sister   . Bipolar disorder Other     Past Medical History:  Diagnosis Date  . Arthritis   . Atrial flutter ([place])    S/p TEE DCCV 07/16/16  . Bipolar 1 disorder ([place])   . CAD (coronary artery disease)   . CHF (congestive heart failure) ([place])   .  Depression   . Hypertension   . Hypothyroidism   . Obesity   . S/P CABG x 3   . Thyroid ca ([place])   . Thyroid disease   . TIA (transient ischemic attack) 07/13/2016    Current Outpatient Prescriptions  Medication Sig Dispense Refill  . acetaminophen (RA ACETAMINOPHEN) 650 MG CR tablet Take 650 mg by mouth every 8 (eight) hours as needed for pain. pain    . albuterol (PROVENTIL HFA;VENTOLIN HFA) 108 (90 BASE) MCG/ACT inhaler Inhale 2 puffs into the lungs every 6 (six) hours as needed for wheezing or shortness of breath. 1 Inhaler 0  . amiodarone (PACERONE) 200 MG tablet Take 1 tablet (200 mg total) by mouth 2 (two) times daily. 60 tablet 1  . aspirin EC 81 MG EC tablet Take 1 tablet (81 mg total) by mouth daily. 30 tablet 1  . atorvastatin (LIPITOR) 20 MG tablet Take 1 tablet (20 mg total) by mouth at bedtime. 30 tablet 1  . bisacodyl (DULCOLAX) 5 MG EC tablet Take 5 mg by mouth 2 (two) times daily as needed for mild constipation.    . divalproex (DEPAKOTE ER) 500 MG 24 hr tablet Take 1,000 mg by mouth 2 (two) times daily.    . ferrous sulfate 325 (65 FE) MG tablet Take 325 mg by mouth  daily with breakfast.    . furosemide (LASIX) 40 MG tablet Take 1 tablet (40 mg total) by mouth 2 (two) times daily. 30 tablet 0  . gabapentin (NEURONTIN) 600 MG tablet Take 600 mg by mouth 2 (two) times daily.    Marland Kitchen levothyroxine (SYNTHROID, LEVOTHROID) 125 MCG tablet Take 125 mcg by mouth daily before breakfast.    . Omega-3 Fatty Acids (FISH OIL) 1000 MG CAPS Take 1,000 mg by mouth daily.    . OXYGEN Inhale 2 L into the lungs continuous.     . polyethylene glycol (MIRALAX / GLYCOLAX) packet Take 17 g by mouth daily. 14 each 0  . potassium chloride SA (K-DUR,KLOR-CON) 20 MEQ tablet Take 1 tablet (20 mEq total) by mouth daily. 30 tablet 0  . risperiDONE (RISPERDAL) 2 MG tablet Take 2 mg by mouth at bedtime.    . rivaroxaban (XARELTO) 20 MG TABS tablet Take 1 tablet (20 mg total) by mouth daily with supper. 30  tablet 1  . zolpidem (AMBIEN) 5 MG tablet Take 5 mg by mouth at bedtime.     No current facility-administered medications for this encounter.     Vitals:   08/05/16 1400  BP: 118/64  Pulse: (!) 104  SpO2: 93%  Weight: 267 lb (121.1 kg)    PHYSICAL EXAM:  General:  Well appearing. No resp difficulty. Arrived in a wheelchair.   HEENT: normal Neck: supple. JVP jaw. Carotids 2+ bilaterally; no bruits. No lymphadenopathy or thryomegaly appreciated. Cor: PMI normal. irregular rate & rhythm. No rubs, gallops or murmurs. Lungs: Decreased in the bases. Clear on 2 liters.  Abdomen: obese, soft, nontender, distended. No hepatosplenomegaly. No bruits or masses. Good bowel sounds. Extremities: no cyanosis, clubbing, rash, RLE and LLE 2+ no edema. Compression stockings in place.  Neuro: alert & orientedx3, cranial nerves grossly intact. Moves all 4 extremities w/o difficulty. Affect pleasant.   ASSESSMENT & PLAN: 1. Chronic Diastolic Heart Failure. ECHO 06/24/2015 EF 55-60%. Grade II  DD.  NYHA III. Volume status elevated. Increase lasix to 80 mg twice daily and add potassium 20 meq twice daily. She will then cut lasix back to 40 mg twice daily and cut back potassium to 20 meq twice daily.  Lengthy discussion about daily weights, low salt diet, and limiting fluid intake to < 2 liters per day.  2. HTN- Stable today. Continue current regimen.   3. CAD H/O CABG - no evidence of ischemia. Continue aspirin and atorvastatin. Restart bb.  4. H/O Bipolar Disorder- Needs PCP for management.  5. Hypothyroidism- Continue synthroid. Need to check TSH LFTs next visit with amiodarone on board.  6. Recurrent A fib- Planning for DC-CV Monday. On amiodarone 200 mg twice a day. Restart toprol xl 25 mg daily. Has not missed doses of xarelto.   Follow up in 2 weeks. Hopefully volume status will be more manageable if able to re-establish NSR.     Amy Clegg  NP-C   2:11 PM

## 2016-08-09 ENCOUNTER — Encounter (HOSPITAL_COMMUNITY): Payer: Self-pay | Admitting: Certified Registered Nurse Anesthetist

## 2016-08-09 ENCOUNTER — Encounter (HOSPITAL_COMMUNITY): Admission: RE | Payer: Self-pay | Source: Ambulatory Visit

## 2016-08-09 ENCOUNTER — Ambulatory Visit (HOSPITAL_COMMUNITY): Admission: RE | Admit: 2016-08-09 | Payer: Medicare Other | Source: Ambulatory Visit | Admitting: Cardiology

## 2016-08-09 SURGERY — CARDIOVERSION
Anesthesia: Monitor Anesthesia Care

## 2016-08-09 NOTE — Anesthesia Preprocedure Evaluation (Deleted)
Anesthesia Evaluation  Patient identified by MRN, date of birth, ID band Patient awake    Reviewed: Allergy & Precautions, NPO status , Patient's Chart, lab work & pertinent test results  Airway        Dental   Pulmonary COPD,           Cardiovascular hypertension, + CAD, + CABG, + Peripheral Vascular Disease and +CHF  + dysrhythmias Atrial Fibrillation      Neuro/Psych PSYCHIATRIC DISORDERS Depression Bipolar Disorder TIACVA    GI/Hepatic negative GI ROS, Neg liver ROS,   Endo/Other  Hypothyroidism   Renal/GU negative Renal ROS  negative genitourinary   Musculoskeletal  (+) Arthritis , Osteoarthritis,    Abdominal   Peds negative pediatric ROS (+)  Hematology negative hematology ROS (+)   Anesthesia Other Findings   Reproductive/Obstetrics negative OB ROS                             Lab Results  Component Value Date   WBC 6.9 08/03/2016   HGB 9.8 (L) 08/03/2016   HCT 33.1 (L) 08/03/2016   MCV 84.4 08/03/2016   PLT 258 08/03/2016   Lab Results  Component Value Date   CREATININE 1.18 (H) 08/03/2016   BUN 20 08/03/2016   NA 142 08/03/2016   K 4.1 08/03/2016   CL 97 (L) 08/03/2016   CO2 35 (H) 08/03/2016   Lab Results  Component Value Date   INR 1.10 07/19/2016   INR 1.13 10/06/2015   07/2016 EKG: sinus tachycardia, RBBB.  06/2016  ECHO - Left ventricle: Systolic function was moderately reduced. The   estimated ejection fraction was in the range of 35% to 40%.   Diffuse hypokinesis. - Aortic valve: No evidence of vegetation. - Mitral valve: No evidence of vegetation. There was mild   regurgitation. - Left atrium: No evidence of thrombus in the atrial cavity or   appendage. No evidence of thrombus in the appendage. - Right atrium: The atrium was dilated. No evidence of thrombus in   the atrial cavity or appendage. - Tricuspid valve: No evidence of vegetation. - Pulmonic  valve: No evidence of vegetation.  Anesthesia Physical Anesthesia Plan  ASA: III  Anesthesia Plan: MAC   Post-op Pain Management:    Induction: Intravenous  Airway Management Planned: Natural Airway  Additional Equipment:   Intra-op Plan:   Post-operative Plan:   Informed Consent: I have reviewed the patients History and Physical, chart, labs and discussed the procedure including the risks, benefits and alternatives for the proposed anesthesia with the patient or authorized representative who has indicated his/her understanding and acceptance.     Plan Discussed with: CRNA  Anesthesia Plan Comments:         Anesthesia Quick Evaluation

## 2016-08-12 ENCOUNTER — Telehealth (HOSPITAL_COMMUNITY): Payer: Self-pay

## 2016-08-12 NOTE — Telephone Encounter (Signed)
Ulu from [facility] [employer] called to report patient had weight gain.  Did not specify specifics (what weight was, how much weight gained, any other s/s). Attempted to call back at # provided, no answer.  Renee Pain, RN

## 2016-08-13 ENCOUNTER — Telehealth (HOSPITAL_COMMUNITY): Payer: Self-pay

## 2016-08-13 NOTE — Telephone Encounter (Signed)
[name] called with report of patient gaining weight for past week.  Weight on 08/07/16 270 lbs, today up to 279 lbs.  Per attending MD there, advised to increase lasix to 80 mg twice daily and potassium to 40 meq for two days, then decrease back down to normal dose with repeat bmet during out follow up apt with patient on 08/18/16. Per Dr. Aundra Dubin, advised to proceed with these orders.  Renee Pain, RN

## 2016-08-18 ENCOUNTER — Ambulatory Visit (HOSPITAL_COMMUNITY)
Admission: RE | Admit: 2016-08-18 | Discharge: 2016-08-18 | Disposition: A | Discharge status: Home / Self Care | Payer: Medicare Other | Source: Ambulatory Visit | Attending: Cardiology | Admitting: Cardiology

## 2016-08-18 VITALS — BP 86/54 | HR 100 | Wt 279.8 lb

## 2016-08-18 DIAGNOSIS — I484 Atypical atrial flutter: Secondary | ICD-10-CM

## 2016-08-18 DIAGNOSIS — I5032 Chronic diastolic (congestive) heart failure: Secondary | ICD-10-CM | POA: Diagnosis not present

## 2016-08-18 DIAGNOSIS — I5043 Acute on chronic combined systolic (congestive) and diastolic (congestive) heart failure: Secondary | ICD-10-CM

## 2016-08-18 LAB — BASIC METABOLIC PANEL
ANION GAP: 10 (ref 5–15)
BUN: 22 mg/dL — ABNORMAL HIGH (ref 6–20)
CALCIUM: 8.9 mg/dL (ref 8.9–10.3)
CO2: 35 mmol/L — AB (ref 22–32)
Chloride: 95 mmol/L — ABNORMAL LOW (ref 101–111)
Creatinine, Ser: 0.98 mg/dL (ref 0.44–1.00)
GFR calc non Af Amer: 60 mL/min — ABNORMAL LOW (ref >60)
GLUCOSE: 86 mg/dL (ref 65–99)
POTASSIUM: 3.7 mmol/L (ref 3.5–5.1)
Sodium: 140 mmol/L (ref 135–145)

## 2016-08-18 LAB — BRAIN NATRIURETIC PEPTIDE: B Natriuretic Peptide: 74.1 pg/mL (ref 0.0–100.0)

## 2016-08-18 MED ORDER — FUROSEMIDE 80 MG PO TABS: 80.0000 mg | ORAL_TABLET | Freq: Two times a day (BID) | ORAL | 6 refills | Status: DC

## 2016-08-18 MED ORDER — POTASSIUM CHLORIDE CRYS ER 20 MEQ PO TBCR: 20.0000 meq | EXTENDED_RELEASE_TABLET | Freq: Two times a day (BID) | ORAL | 6 refills | Status: AC

## 2016-08-18 MED ORDER — METOLAZONE 2.5 MG PO TABS: ORAL_TABLET | ORAL | 1 refills | Status: DC

## 2016-08-18 NOTE — Progress Notes (Signed)
Advanced Heart Failure Medication Review by a Pharmacist  Does the patient  feel that his/her medications are working for him/her?  yes  Has the patient been experiencing any side effects to the medications prescribed?  no  Does the patient measure his/her own blood pressure or blood glucose at home?  yes   Does the patient have any problems obtaining medications due to transportation or finances?   no  Understanding of regimen: good Understanding of indications: good Potential of compliance: good Patient understands to avoid NSAIDs. Patient understands to avoid decongestants.  Issues to address at subsequent visits: None   Pharmacist comments:  Tonya Hogan is a pleasant 65 yo F presenting from [facility] with an updated MAR. No significant discrepancies noted.   Tonya Hogan. Tonya Hogan, PharmD, BCPS, CPP Clinical Pharmacist Pager: [phone] Phone: [phone] 08/18/2016 12:02 PM      Time with patient: 2 minutes Preparation and documentation time: 10 minutes Total time: 12 minutes

## 2016-08-18 NOTE — Patient Instructions (Signed)
INCREASE lasix to 80 mg twice daily.  INCREASE Potassium to 20 meq twice daily.  Take metolazone 2.5 mg once tomorrow and once Friday.  Follow up 1 week with labs (bmet) and ekg.  Do the following things EVERYDAY: 1) Weigh yourself in the morning before breakfast. Write it down and keep it in a log. 2) Take your medicines as prescribed 3) Eat low salt foods-Limit salt (sodium) to 2000 mg per day.  4) Stay as active as you can everyday 5) Limit all fluids for the day to less than 2 liters

## 2016-08-18 NOTE — Progress Notes (Addendum)
Patient ID: Tonya Hogan, female   DOB: 02-02-1951, 65 y.o.   MRN: [id number]  PCP: None  Primary Cardiologist: None   HPI: Tonya Hogan is a 65 y.o. female with PMH of hypertension, thyroid cancer, hypothyroidism, CAD, s/p CABG 01/31/2010, bipolar, obesity, COPD, chronic anemia, diastolic congestive heart failure with EF 55-60% and grade 2 diastolic dysfunction, depression, and bipolar disorder.   She has had 2 hospital admits in the last 4 months for volume overload. Most recent admit 11/14 through 10/09/15 with volume overload and facial droop. Neurologic work up was negative. Diuresed with IV lasix and transitioned to lasix 40 mg twice a day. Discharge weight 257 pounds.   Today she returns for follow. Last visit she was restarted on torpol xl . Overall feeling ok. Wants to go home from SNF.  Plans to go to her daughters house.  Complaining leg edema. Denies SOB/PND/Orthopnea. Weight at SNF 280 pounds. All medication.   BMET 10/09/15 K 4.0 Creatinine 0.77 BMET 10/21/2015: K 4.4 Creatinine 0.98 BNP 152.  BMET 08/03/2016: K 4.1 Creatinine 1.18 Hgb 9.8    ROS: All systems negative except as listed in HPI, PMH and Problem List.  SH:  Social History   Social History  . Marital status: Widowed    Spouse name: N/A  . Number of children: N/A  . Years of education: N/A   Occupational History  . Retired    Social History Main Topics  . Smoking status: Never Smoker  . Smokeless tobacco: Never Used  . Alcohol use No  . Drug use: No  . Sexual activity: No   Other Topics Concern  . Not on file   Social History Narrative   Has a daughter in the area.    FH:  Family History  Problem Relation Age of Onset  . Hypertension Sister   . Bipolar disorder Other     Past Medical History:  Diagnosis Date  . Arthritis   . Atrial flutter ([place])    S/p TEE DCCV 07/16/16  . Bipolar 1 disorder ([place])   . CAD (coronary artery disease)   . CHF (congestive heart failure) ([place])   . Depression    . Hypertension   . Hypothyroidism   . Obesity   . S/P CABG x 3   . Thyroid ca ([place])   . Thyroid disease   . TIA (transient ischemic attack) 07/13/2016    Current Outpatient Prescriptions  Medication Sig Dispense Refill  . acetaminophen (RA ACETAMINOPHEN) 650 MG CR tablet Take 650 mg by mouth every 8 (eight) hours as needed for pain. pain    . albuterol (PROVENTIL HFA;VENTOLIN HFA) 108 (90 BASE) MCG/ACT inhaler Inhale 2 puffs into the lungs every 6 (six) hours as needed for wheezing or shortness of breath. 1 Inhaler 0  . Amino Acids-Protein Hydrolys (FEEDING SUPPLEMENT, PRO-STAT SUGAR FREE 64,) LIQD Take 30 mLs by mouth daily.    Marland Kitchen amiodarone (PACERONE) 200 MG tablet Take 1 tablet (200 mg total) by mouth 2 (two) times daily. 60 tablet 1  . aspirin EC 81 MG EC tablet Take 1 tablet (81 mg total) by mouth daily. 30 tablet 1  . atorvastatin (LIPITOR) 20 MG tablet Take 1 tablet (20 mg total) by mouth at bedtime. 30 tablet 1  . bisacodyl (DULCOLAX) 5 MG EC tablet Take 5 mg by mouth 2 (two) times daily as needed for mild constipation.    . divalproex (DEPAKOTE ER) 500 MG 24 hr tablet Take 1,000 mg by mouth 2 (  two) times daily.    . furosemide (LASIX) 40 MG tablet Take 1 tablet (40 mg total) by mouth 2 (two) times daily. 30 tablet 0  . gabapentin (NEURONTIN) 600 MG tablet Take 600 mg by mouth 2 (two) times daily.    Marland Kitchen levothyroxine (SYNTHROID, LEVOTHROID) 125 MCG tablet Take 125 mcg by mouth daily before breakfast.    . metoprolol succinate (TOPROL XL) 25 MG 24 hr tablet Take 1 tablet (25 mg total) by mouth daily. 30 tablet 6  . Omega-3 Fatty Acids (FISH OIL) 1000 MG CAPS Take 1,000 mg by mouth daily.    . OXYGEN Inhale 2 L into the lungs continuous.     . polyethylene glycol (MIRALAX / GLYCOLAX) packet Take 17 g by mouth daily. 14 each 0  . potassium chloride SA (K-DUR,KLOR-CON) 20 MEQ tablet Take 1 tablet (20 mEq total) by mouth daily. 30 tablet 0  . risperiDONE (RISPERDAL) 2 MG tablet Take 2  mg by mouth at bedtime.    . rivaroxaban (XARELTO) 20 MG TABS tablet Take 1 tablet (20 mg total) by mouth daily with supper. 30 tablet 1  . zolpidem (AMBIEN) 5 MG tablet Take 5 mg by mouth at bedtime.     No current facility-administered medications for this encounter.     Vitals:   08/18/16 1149  BP: (!) 86/54  Pulse: 100  SpO2: 93%  Weight: 279 lb 12.8 oz (126.9 kg)    PHYSICAL EXAM: General:  Well appearing. No resp difficulty. Arrived in a wheelchair.   HEENT: normal Neck: supple. JVP jaw. Carotids 2+ bilaterally; no bruits. No lymphadenopathy or thryomegaly appreciated. Cor: PMI normal. irregular rate & rhythm. No rubs, gallops or murmurs. Lungs: Decreased in the bases. Clear on 2 liters.  Abdomen: obese, soft, nontender, distended. No hepatosplenomegaly. No bruits or masses. Good bowel sounds. Extremities: no cyanosis, clubbing, rash, RLE and LLE 3+edema.   Neuro: alert & orientedx3, cranial nerves grossly intact. Moves all 4 extremities w/o difficulty. Affect pleasant.   EKG: A flutter 90 bpm reivewed by Dr Haroldine Laws   ASSESSMENT & PLAN: 1. Acute/ Chronic Diastolic Heart Failure. ECHO 06/24/2015 EF 55-60%. Grade II  DD.  NYHA III. Volume status elevated. Increase lasix to 80 mg twice daily and add potassium 20 meq twice daily.  Lengthy discussion about daily weights, low salt diet, and limiting fluid intake to < 2 liters per day.  2. HTN- BP soft.    3. CAD H/O CABG - no evidence of ischemia. Continue aspirin and atorvastatin. Restart bb.  4. H/O Bipolar Disorder- Needs PCP for management.  5. Hypothyroidism- Continue synthroid. Need to check TSH LFTs next visit with amiodarone on board.  6. Recurrent A fib/A flutter- EKG today  A Flutter. Continue amiodarone 200 mg twice a day. Continue  toprol xl 25 mg daily. Has not missed doses of xarelto.   She will need DC-CV after we get a better handle on her volume. Follow up in 7 days.  She will need BMET/BNP next week.     Amy  Clegg  NP-C   12:11 PM  Patient seen and examined with Darrick Grinder, NP. We discussed all aspects of the encounter. I agree with the assessment and plan as stated above.   She has worsening volume overload in the setting of recurrent AF/AFL. Will increase diuretics and then proceed with DC-CV. Continue amiodarone and Xarelto. If volume status worsening may need admission for IV diuresis followed by DC-CV. CAD is stable with no  evidence ischemia at this point.   Total time spent 40 minutes. Over half that time spent discussing above.   Shirlette Scarber,MD 10:30 PM

## 2016-08-19 ENCOUNTER — Encounter: Payer: Self-pay | Admitting: Internal Medicine

## 2016-08-19 ENCOUNTER — Non-Acute Institutional Stay (SKILLED_NURSING_FACILITY): Payer: Medicare Other | Admitting: Internal Medicine

## 2016-08-19 DIAGNOSIS — E890 Postprocedural hypothyroidism: Secondary | ICD-10-CM

## 2016-08-19 DIAGNOSIS — F3110 Bipolar disorder, current episode manic without psychotic features, unspecified: Secondary | ICD-10-CM | POA: Diagnosis not present

## 2016-08-19 DIAGNOSIS — I5043 Acute on chronic combined systolic (congestive) and diastolic (congestive) heart failure: Secondary | ICD-10-CM | POA: Diagnosis not present

## 2016-08-19 DIAGNOSIS — J9621 Acute and chronic respiratory failure with hypoxia: Secondary | ICD-10-CM

## 2016-08-19 DIAGNOSIS — D508 Other iron deficiency anemias: Secondary | ICD-10-CM

## 2016-08-19 DIAGNOSIS — D151 Benign neoplasm of heart: Secondary | ICD-10-CM | POA: Diagnosis not present

## 2016-08-19 DIAGNOSIS — I484 Atypical atrial flutter: Secondary | ICD-10-CM | POA: Diagnosis not present

## 2016-08-19 NOTE — Progress Notes (Signed)
Location:  [place] Room Number: [place] of Service:  SNF (31) Noah Delaine. Sheppard Coil, MD  PCP: No PCP Per Patient Patient Care Team: No Pcp Per Patient as PCP - General (General Practice)  Extended Emergency Contact Information Primary Emergency Contact: Laurey Morale States of [place] Phone: [phone] Relation: Daughter Secondary Emergency Contact: Daniel Nones States of [place] Phone: [phone] Relation: Sister  Allergies  Allergen Reactions  . Methimazole Hives and Rash  . Propylthiouracil Hives  . Chocolate Other (See Comments)    Sneezing, coughing  . Other Other (See Comments)    Corn, cabbage, greens cause Rectal bleeding  . Peanut-Containing Drug Products Other (See Comments)    Sneezing,coughing  . Penicillins Itching, Other (See Comments) and Cough    Sneezing Has patient had a PCN reaction causing immediate rash, facial/tongue/throat swelling, SOB or lightheadedness with hypotension: Yes Has patient had a PCN reaction causing severe rash involving mucus membranes or skin necrosis: Unknown Has patient had a PCN reaction that required hospitalization: Unknown Has patient had a PCN reaction occurring within the last 10 years: No If all of the above answers are "NO", then may proceed with Cephalosporin use.   . Vicodin [Hydrocodone-Acetaminophen] Rash    Chief Complaint  Patient presents with  . Discharge Note    Discharged from SNF    HPI:  65 y.o. female  with Bipolar DO, TIA, Obesity , HTN hypertension,, COPD, home o2, CAD native artery status post CABG, Chronic SystolicCHF, Atrial Flutter, cardioverted 8/25, Hypothyroidism who was admitted to [facility] form 8/28-9/2 for acute on chronic respiratory failure 2/2 acute on chronic CHF. Pt had just been d/c from hospital on 8/27 after having been treated for TIA and AF with RVR.PNA was ruled out as was acute cardiac event. Pt was admitted to SNF  for ot/pt. And is now ready to be d/c to home.    Past Medical History:  Diagnosis Date  . Arthritis   . Atrial flutter ([place])    S/p TEE DCCV 07/16/16  . Bipolar 1 disorder ([place])   . CAD (coronary artery disease)   . CHF (congestive heart failure) ([place])   . Depression   . Hypertension   . Hypothyroidism   . Obesity   . S/P CABG x 3   . Thyroid ca ([place])   . Thyroid disease   . TIA (transient ischemic attack) 07/13/2016    Past Surgical History:  Procedure Laterality Date  . ABDOMINAL HYSTERECTOMY    . CARDIOVERSION N/A 07/16/2016   Procedure: CARDIOVERSION;  Surgeon: Jerline Pain, MD;  Location: [place];  Service: Cardiovascular;  Laterality: N/A;  . CORONARY ARTERY BYPASS GRAFT    . TEE WITHOUT CARDIOVERSION N/A 07/16/2016   Procedure: TRANSESOPHAGEAL ECHOCARDIOGRAM (TEE);  Surgeon: Jerline Pain, MD;  Location: [facility] ENDOSCOPY;  Service: Cardiovascular;  Laterality: N/A;  . TONSILLECTOMY       reports that she has never smoked. She has never used smokeless tobacco. She reports that she does not drink alcohol or use drugs. Social History   Social History  . Marital status: Widowed    Spouse name: N/A  . Number of children: N/A  . Years of education: N/A   Occupational History  . Retired    Social History Main Topics  . Smoking status: Never Smoker  . Smokeless tobacco: Never Used  . Alcohol use No  . Drug use: No  . Sexual activity: No  Other Topics Concern  . Not on file   Social History Narrative   Has a daughter in the area.    Pertinent  Health Maintenance Due  Topic Date Due  . PAP SMEAR  08/21/1972  . MAMMOGRAM  08/21/2001  . COLONOSCOPY  08/21/2001  . INFLUENZA VACCINE  06/22/2016    Medications:   Medication List       Accurate as of 08/19/16  8:18 PM. Always use your most recent med list.          albuterol 108 (90 Base) MCG/ACT inhaler Commonly known as:  PROVENTIL HFA;VENTOLIN HFA Inhale 2 puffs into the lungs every 6 (six)  hours as needed for wheezing or shortness of breath.   amiodarone 200 MG tablet Commonly known as:  PACERONE Take 1 tablet (200 mg total) by mouth 2 (two) times daily.   aspirin 81 MG EC tablet Take 1 tablet (81 mg total) by mouth daily.   atorvastatin 20 MG tablet Commonly known as:  LIPITOR Take 1 tablet (20 mg total) by mouth at bedtime.   bisacodyl 5 MG EC tablet Commonly known as:  DULCOLAX Take 5 mg by mouth every 12 (twelve) hours as needed for mild constipation.   divalproex 500 MG 24 hr tablet Commonly known as:  DEPAKOTE ER Take 1,000 mg by mouth 2 (two) times daily.   ferrous sulfate 325 (65 FE) MG tablet Take 325 mg by mouth daily with breakfast.   Fish Oil 1000 MG Caps Take 1,000 mg by mouth daily.   furosemide 80 MG tablet Commonly known as:  LASIX Take 1 tablet (80 mg total) by mouth 2 (two) times daily.   gabapentin 600 MG tablet Commonly known as:  NEURONTIN Take 600 mg by mouth 2 (two) times daily.   levothyroxine 125 MCG tablet Commonly known as:  SYNTHROID, LEVOTHROID Take 125 mcg by mouth daily before breakfast.   polyethylene glycol packet Commonly known as:  MIRALAX / GLYCOLAX Take 17 g by mouth daily.   potassium chloride SA 20 MEQ tablet Commonly known as:  K-DUR,KLOR-CON Take 1 tablet (20 mEq total) by mouth 2 (two) times daily.   RA ACETAMINOPHEN 650 MG CR tablet Generic drug:  acetaminophen Take 650 mg by mouth every 8 (eight) hours as needed for pain. pain   risperiDONE 2 MG tablet Commonly known as:  RISPERDAL Take 2 mg by mouth at bedtime.   rivaroxaban 20 MG Tabs tablet Commonly known as:  XARELTO Take 1 tablet (20 mg total) by mouth daily with supper.   traMADol 50 MG tablet Commonly known as:  ULTRAM Take 50 mg by mouth every 8 (eight) hours as needed for severe pain.   zolpidem 5 MG tablet Commonly known as:  AMBIEN Take 5 mg by mouth at bedtime.        Vitals:   08/19/16 1201  BP: 117/74  Pulse: 100  Resp:  18  Temp: 98.7 F (37.1 C)  Weight: 279 lb (126.6 kg)  Height: 5' (1.524 m)   Body mass index is 54.49 kg/m.  Physical Exam  GENERAL APPEARANCE: Alert, conversant. No acute distress.  HEENT: Unremarkable. RESPIRATORY: Breathing is even, unlabored. Lung sounds are clear   CARDIOVASCULAR: Heart RRR no murmurs, rubs or gallops. No peripheral edema.  GASTROINTESTINAL: Abdomen is soft, non-tender, not distended w/ normal bowel sounds.  NEUROLOGIC: Cranial nerves 2-12 grossly intact. Moves all extremities   Labs reviewed: Basic Metabolic Panel:  Recent Labs  07/13/16 1335 07/13/16 2206  07/20/16 0347  07/23/16 1319 08/03/16 1645 08/18/16 1246  NA 140  --   < > 142  < > 140  140 142 140  K 4.3  --   < > 3.8  < > 3.7  3.7 4.1 3.7  CL 99*  --   < > 100*  < > 92* 97* 95*  CO2 34*  --   < > 38*  < > 42* 35* 35*  GLUCOSE 134*  --   < > 102*  < > 108* 103* 86  BUN 32*  --   < > 13  < > 7  7 20  22*  CREATININE 0.94  --   < > 0.86  < > 0.92  0.9 1.18* 0.98  CALCIUM 8.8*  --   < > 8.3*  < > 8.7* 8.9 8.9  MG 1.8 1.9  --  2.0  --   --   --   --   < > = values in this interval not displayed. No results found for: [facility] Liver Function Tests:  Recent Labs  07/13/16 1335  07/14/16 0607 07/19/16 07/19/16 1240  AST 11*  < > 11* 14 14*  ALT 10*  < > 9* 10 10*  ALKPHOS 49  < > 51 53 53  BILITOT 0.2*  --  0.3  --  0.1*  PROT 7.7  --  7.0  --  7.5  ALBUMIN 2.9*  --  2.7*  --  2.9*  < > = values in this interval not displayed.  Recent Labs  09/06/15 1440  LIPASE 32   No results for input(s): AMMONIA in the last 8760 hours. CBC:  Recent Labs  07/13/16 1335  07/19/16 1240  07/23/16 0752 07/24/16 07/24/16 0330 08/03/16 1645  WBC 6.6  < > 13.5*  < > 6.3  6.3 6.1 6.1 6.9  NEUTROABS 3.9  --  11.0*  --   --   --   --  3,726  HGB 9.1*  < > 8.4*  < > 9.1*  9.1*  --  9.1* 9.8*  HCT 30.8*  < > 28.9*  < > 32.5*  33*  --  32.8* 33.1*  MCV 85.6  < > 85.3  < > 86.2  --   87.2 84.4  PLT 298  < > 262  < > 266  266  --  275 258  < > = values in this interval not displayed. Lipid  Recent Labs  10/07/15 0602 07/14/16 0324  CHOL 213* 190  HDL 51 49  LDLCALC 151* 128*  TRIG 54 67   Cardiac Enzymes:  Recent Labs  07/14/16 0324 07/14/16 0607 07/19/16 1250  TROPONINI <0.03 <0.03 <0.03   BNP:  Recent Labs  07/13/16 1335 07/19/16 1250 08/18/16 1246  BNP 328.4* 348.4* 74.1   CBG:  Recent Labs  10/06/15 1826  GLUCAP 75    Procedures and Imaging Studies During Stay: No results found.  Assessment/Plan:    Acute on chronic respiratory failure with hypoxia (HCC)  Acute on chronic combined systolic and diastolic congestive heart failure (HCC)  Atypical atrial flutter (HCC)  Atrial myxoma  Postoperative hypothyroidism  Morbid obesity due to excess calories (HCC)  Bipolar affective disorder, manic ([place])  Anemia, iron deficiency   Patient is being discharged with the following home health services:  HH/OT/PT/Nursing  Patient is being discharged with the following durable medical equipment: rolling walker  Patient has been advised to f/u with their PCP in 1-2  weeks to bring them up to date on their rehab stay.  Social services at facility was responsible for arranging this appointment.  Pt was provided with a 30 day supply of prescriptions for medications and refills must be obtained from their PCP.  For controlled substances, a more limited supply may be provided adequate until PCP appointment only.    Time spent > 30 min;> 50% of time with patient was spent reviewing records, labs, tests and studies, counseling and developing plan of care  Noah Delaine. Sheppard Coil, MD

## 2016-08-24 ENCOUNTER — Encounter (HOSPITAL_COMMUNITY): Payer: Self-pay

## 2016-08-24 ENCOUNTER — Ambulatory Visit (HOSPITAL_COMMUNITY)
Admission: RE | Admit: 2016-08-24 | Discharge: 2016-08-24 | Disposition: A | Discharge status: Home / Self Care | Payer: Medicare Other | Source: Ambulatory Visit | Attending: Cardiology | Admitting: Cardiology

## 2016-08-24 ENCOUNTER — Other Ambulatory Visit (HOSPITAL_COMMUNITY): Payer: Self-pay

## 2016-08-24 VITALS — BP 88/54 | HR 98 | Wt 270.2 lb

## 2016-08-24 DIAGNOSIS — J439 Emphysema, unspecified: Secondary | ICD-10-CM | POA: Diagnosis not present

## 2016-08-24 DIAGNOSIS — F3110 Bipolar disorder, current episode manic without psychotic features, unspecified: Secondary | ICD-10-CM

## 2016-08-24 DIAGNOSIS — I484 Atypical atrial flutter: Secondary | ICD-10-CM | POA: Diagnosis not present

## 2016-08-24 DIAGNOSIS — J9611 Chronic respiratory failure with hypoxia: Secondary | ICD-10-CM

## 2016-08-24 DIAGNOSIS — I5032 Chronic diastolic (congestive) heart failure: Secondary | ICD-10-CM | POA: Diagnosis present

## 2016-08-24 DIAGNOSIS — I4891 Unspecified atrial fibrillation: Secondary | ICD-10-CM

## 2016-08-24 LAB — BASIC METABOLIC PANEL
Anion gap: 10 (ref 5–15)
BUN: 34 mg/dL — ABNORMAL HIGH (ref 6–20)
CHLORIDE: 87 mmol/L — AB (ref 101–111)
CO2: 41 mmol/L — AB (ref 22–32)
CREATININE: 1.39 mg/dL — AB (ref 0.44–1.00)
Calcium: 8.8 mg/dL — ABNORMAL LOW (ref 8.9–10.3)
GFR calc non Af Amer: 39 mL/min — ABNORMAL LOW (ref >60)
GFR, EST AFRICAN AMERICAN: 45 mL/min — AB (ref >60)
GLUCOSE: 211 mg/dL — AB (ref 65–99)
Potassium: 3.1 mmol/L — ABNORMAL LOW (ref 3.5–5.1)
Sodium: 138 mmol/L (ref 135–145)

## 2016-08-24 LAB — BRAIN NATRIURETIC PEPTIDE: B Natriuretic Peptide: 56 pg/mL (ref 0.0–100.0)

## 2016-08-24 NOTE — Patient Instructions (Addendum)
You have been scheduled for a cardioversion next week: Wednesday 09/01/2016. See instruction sheet for additional details.  Follow up 2 weeks with Oda Kilts, Certified 24 Assistant  Do the following things EVERYDAY: 1) Weigh yourself in the morning before breakfast. Write it down and keep it in a log. 2) Take your medicines as prescribed 3) Eat low salt foods-Limit salt (sodium) to 2000 mg per day.  4) Stay as active as you can everyday 5) Limit all fluids for the day to less than 2 liters

## 2016-08-24 NOTE — Progress Notes (Signed)
Patient ID: Tonya Hogan, female   DOB: 11-Mar-1951, 65 y.o.   MRN: [id number]  PCP: None  Primary Cardiologist: None   HPI: Tonya Hogan is a 65 y.o. female with PMH of hypertension, thyroid cancer, hypothyroidism, CAD, s/p CABG 01/31/2010, bipolar, obesity, COPD, chronic anemia, diastolic congestive heart failure with EF 55-60% and grade 2 diastolic dysfunction, depression, and bipolar disorder.   She has had 2 hospital admits in the last 4 months for volume overload. Most recent admit 11/14 through 10/09/15 with volume overload and facial droop. Neurologic work up was negative. Diuresed with IV lasix and transitioned to lasix 40 mg twice a day. Discharge weight 257 pounds.   She returns today for week follow up with marked volume. Down 9 lbs with increase in lasix. Urine output has tapered off slightly since last week. Came home yesterday from facility. Daughter is going to be preparing food.  States she plans on using Mrs Tonya Hogan. Leg edema has improved. Didn't check weight at home ( was "afraid" to check). Denies SOB/PND/Orthopnea. Wears 02 chronically, 2L. No SOB changing clothes or bathing.   BMET 10/09/15 K 4.0 Creatinine 0.77 BMET 10/21/2015: K 4.4 Creatinine 0.98 BNP 152.  BMET 08/03/2016: K 4.1 Creatinine 1.18 Hgb 9.8    ROS: All systems negative except as listed in HPI, PMH and Problem List.  SH:  Social History   Social History  . Marital status: Widowed    Spouse name: N/A  . Number of children: N/A  . Years of education: N/A   Occupational History  . Retired    Social History Main Topics  . Smoking status: Never Smoker  . Smokeless tobacco: Never Used  . Alcohol use No  . Drug use: No  . Sexual activity: No   Other Topics Concern  . Not on file   Social History Narrative   Has a daughter in the area.    FH:  Family History  Problem Relation Age of Onset  . Hypertension Sister   . Bipolar disorder Other     Past Medical History:  Diagnosis Date  .  Arthritis   . Atrial flutter ([place])    S/p TEE DCCV 07/16/16  . Bipolar 1 disorder ([place])   . CAD (coronary artery disease)   . CHF (congestive heart failure) ([place])   . Depression   . Hypertension   . Hypothyroidism   . Obesity   . S/P CABG x 3   . Thyroid ca ([place])   . Thyroid disease   . TIA (transient ischemic attack) 07/13/2016    Current Outpatient Prescriptions  Medication Sig Dispense Refill  . acetaminophen (RA ACETAMINOPHEN) 650 MG CR tablet Take 650 mg by mouth every 8 (eight) hours as needed for pain. pain    . albuterol (PROVENTIL HFA;VENTOLIN HFA) 108 (90 BASE) MCG/ACT inhaler Inhale 2 puffs into the lungs every 6 (six) hours as needed for wheezing or shortness of breath. 1 Inhaler 0  . amiodarone (PACERONE) 200 MG tablet Take 1 tablet (200 mg total) by mouth 2 (two) times daily. 60 tablet 1  . aspirin EC 81 MG EC tablet Take 1 tablet (81 mg total) by mouth daily. 30 tablet 1  . atorvastatin (LIPITOR) 20 MG tablet Take 1 tablet (20 mg total) by mouth at bedtime. 30 tablet 1  . bisacodyl (DULCOLAX) 5 MG EC tablet Take 5 mg by mouth every 12 (twelve) hours as needed for mild constipation.     . divalproex (DEPAKOTE ER) 500 MG  24 hr tablet Take 1,000 mg by mouth 2 (two) times daily.    . ferrous sulfate 325 (65 FE) MG tablet Take 325 mg by mouth daily with breakfast.    . furosemide (LASIX) 80 MG tablet Take 1 tablet (80 mg total) by mouth 2 (two) times daily. 60 tablet 6  . gabapentin (NEURONTIN) 600 MG tablet Take 600 mg by mouth 2 (two) times daily.    Marland Kitchen levothyroxine (SYNTHROID, LEVOTHROID) 125 MCG tablet Take 125 mcg by mouth daily before breakfast.    . Omega-3 Fatty Acids (FISH OIL) 1000 MG CAPS Take 1,000 mg by mouth daily.    . polyethylene glycol (MIRALAX / GLYCOLAX) packet Take 17 g by mouth daily. 14 each 0  . potassium chloride SA (K-DUR,KLOR-CON) 20 MEQ tablet Take 1 tablet (20 mEq total) by mouth 2 (two) times daily. 60 tablet 6  . risperiDONE (RISPERDAL) 2 MG  tablet Take 2 mg by mouth at bedtime.    . rivaroxaban (XARELTO) 20 MG TABS tablet Take 1 tablet (20 mg total) by mouth daily with supper. 30 tablet 1  . traMADol (ULTRAM) 50 MG tablet Take 50 mg by mouth every 8 (eight) hours as needed for severe pain.    Marland Kitchen zolpidem (AMBIEN) 5 MG tablet Take 5 mg by mouth at bedtime.     No current facility-administered medications for this encounter.     Vitals:   08/24/16 1159  BP: (!) 88/54  Pulse: 98  SpO2: 96%  Weight: 270 lb 3.2 oz (122.6 kg)   Wt Readings from Last 3 Encounters:  08/24/16 270 lb 3.2 oz (122.6 kg)  08/19/16 279 lb (126.6 kg)  08/18/16 279 lb 12.8 oz (126.9 kg)     PHYSICAL EXAM: General:  Well appearing. No resp difficulty. Arrived in a wheelchair.   HEENT: normal Neck: supple. JVP 9-10 cm. Carotids 2+ bilaterally; no bruits. No thyromegaly or nodule noted.  Cor: PMI normal. Irregular, somewhat tachy. No M/G/R appreciated.  Lungs: Diminished basilar sounds.   Abdomen: obese, soft, NT, ND, no HSM. No bruits or masses. +BS  Extremities: no cyanosis, clubbing, rash, BLE 2+ edema.    Neuro: alert & orientedx3, cranial nerves grossly intact. Moves all 4 extremities w/o difficulty. Affect pleasant.   EKG: A flutter 103 bpm with variable AV block  ASSESSMENT & PLAN: 1. Chronic Diastolic Heart Failure. ECHO 06/24/2015 EF 55-60%. Grade II  DD.  NYHA III.  - Volume status improve but remains mildly elevated. - Continue lasix 80 mg BID with  potassium 20 meq twice daily for now.  BMET today. Repeat next week with DCCV.  - Reinforced daily weights, and limiting sodium and fluid intake.  2. HTN - Remains soft. No room to titrate meds.  3. CAD H/O CABG - no evidence of ischemia. Continue aspirin and atorvastatin. Restart bb.  4. H/O Bipolar Disorder- Needs PCP for management.  5. Hypothyroidism- Continue synthroid. - Will check TSH and LFTs at next visit.   6. Recurrent A fib/A flutter - EKG today shows A flutter. Continue  amiodarone 200 mg twice a day. Continue toprol xl 25 mg daily. Has not missed doses of xarelto.   Will schedule for DCCV. ? Need to consider ablation if fails a second time. BMET today. Follow up 1 week post cardioversion.    Satira Mccallum Tonya Stalker  PA-C   12:00 PM  Total time spent > 25 minutes. Over half that spent discussing the above.  Discussed DCCV, Xarelto and importance  of not missing doses, plus possibility of eventually being considered for ablation.

## 2016-08-27 ENCOUNTER — Encounter (HOSPITAL_COMMUNITY): Payer: Self-pay | Admitting: Cardiology

## 2016-09-01 ENCOUNTER — Encounter (HOSPITAL_COMMUNITY): Payer: Self-pay | Admitting: Certified Registered Nurse Anesthetist

## 2016-09-01 ENCOUNTER — Encounter (HOSPITAL_COMMUNITY): Admission: RE | Payer: Self-pay | Source: Ambulatory Visit

## 2016-09-01 SURGERY — CARDIOVERSION
Anesthesia: Monitor Anesthesia Care

## 2016-09-02 ENCOUNTER — Ambulatory Visit (HOSPITAL_COMMUNITY): Admission: RE | Admit: 2016-09-02 | Payer: Medicare Other | Source: Ambulatory Visit | Admitting: Internal Medicine

## 2016-09-07 ENCOUNTER — Encounter (HOSPITAL_COMMUNITY): Payer: Self-pay

## 2016-09-09 ENCOUNTER — Emergency Department (HOSPITAL_BASED_OUTPATIENT_CLINIC_OR_DEPARTMENT_OTHER): Payer: Medicare Other

## 2016-09-09 ENCOUNTER — Emergency Department (HOSPITAL_BASED_OUTPATIENT_CLINIC_OR_DEPARTMENT_OTHER)
Admission: EM | Admit: 2016-09-09 | Discharge: 2016-09-10 | Disposition: A | Discharge status: Home / Self Care | Payer: Medicare Other | Attending: Emergency Medicine | Admitting: Emergency Medicine

## 2016-09-09 ENCOUNTER — Encounter (HOSPITAL_BASED_OUTPATIENT_CLINIC_OR_DEPARTMENT_OTHER): Payer: Self-pay | Admitting: *Deleted

## 2016-09-09 DIAGNOSIS — I110 Hypertensive heart disease with heart failure: Secondary | ICD-10-CM | POA: Insufficient documentation

## 2016-09-09 DIAGNOSIS — I2510 Atherosclerotic heart disease of native coronary artery without angina pectoris: Secondary | ICD-10-CM | POA: Insufficient documentation

## 2016-09-09 DIAGNOSIS — R079 Chest pain, unspecified: Secondary | ICD-10-CM | POA: Insufficient documentation

## 2016-09-09 DIAGNOSIS — E039 Hypothyroidism, unspecified: Secondary | ICD-10-CM | POA: Insufficient documentation

## 2016-09-09 DIAGNOSIS — R609 Edema, unspecified: Secondary | ICD-10-CM

## 2016-09-09 DIAGNOSIS — Z7982 Long term (current) use of aspirin: Secondary | ICD-10-CM | POA: Insufficient documentation

## 2016-09-09 DIAGNOSIS — R600 Localized edema: Secondary | ICD-10-CM | POA: Insufficient documentation

## 2016-09-09 DIAGNOSIS — Z8673 Personal history of transient ischemic attack (TIA), and cerebral infarction without residual deficits: Secondary | ICD-10-CM | POA: Insufficient documentation

## 2016-09-09 DIAGNOSIS — Z79899 Other long term (current) drug therapy: Secondary | ICD-10-CM | POA: Insufficient documentation

## 2016-09-09 DIAGNOSIS — R0602 Shortness of breath: Secondary | ICD-10-CM | POA: Insufficient documentation

## 2016-09-09 DIAGNOSIS — I5032 Chronic diastolic (congestive) heart failure: Secondary | ICD-10-CM | POA: Insufficient documentation

## 2016-09-09 DIAGNOSIS — M7989 Other specified soft tissue disorders: Secondary | ICD-10-CM | POA: Diagnosis present

## 2016-09-09 LAB — CBC WITH DIFFERENTIAL/PLATELET
BASOS PCT: 1 %
Basophils Absolute: 0 10*3/uL (ref 0.0–0.1)
EOS ABS: 0.2 10*3/uL (ref 0.0–0.7)
EOS PCT: 4 %
HCT: 36.3 % (ref 36.0–46.0)
HEMOGLOBIN: 11.1 g/dL — AB (ref 12.0–15.0)
LYMPHS ABS: 1.6 10*3/uL (ref 0.7–4.0)
Lymphocytes Relative: 35 %
MCH: 26.5 pg (ref 26.0–34.0)
MCHC: 30.6 g/dL (ref 30.0–36.0)
MCV: 86.6 fL (ref 78.0–100.0)
MONO ABS: 0.9 10*3/uL (ref 0.1–1.0)
MONOS PCT: 19 %
NEUTROS PCT: 41 %
Neutro Abs: 1.9 10*3/uL (ref 1.7–7.7)
PLATELETS: 200 10*3/uL (ref 150–400)
RBC: 4.19 MIL/uL (ref 3.87–5.11)
RDW: 20.2 % — AB (ref 11.5–15.5)
WBC: 4.5 10*3/uL (ref 4.0–10.5)

## 2016-09-09 LAB — BASIC METABOLIC PANEL
Anion gap: 4 — ABNORMAL LOW (ref 5–15)
BUN: 23 mg/dL — AB (ref 6–20)
CALCIUM: 8.5 mg/dL — AB (ref 8.9–10.3)
CHLORIDE: 102 mmol/L (ref 101–111)
CO2: 33 mmol/L — ABNORMAL HIGH (ref 22–32)
CREATININE: 1.08 mg/dL — AB (ref 0.44–1.00)
GFR calc non Af Amer: 53 mL/min — ABNORMAL LOW (ref >60)
Glucose, Bld: 79 mg/dL (ref 65–99)
Potassium: 4.6 mmol/L (ref 3.5–5.1)
SODIUM: 139 mmol/L (ref 135–145)

## 2016-09-09 LAB — TROPONIN I

## 2016-09-09 LAB — BRAIN NATRIURETIC PEPTIDE: B NATRIURETIC PEPTIDE 5: 63.7 pg/mL (ref 0.0–100.0)

## 2016-09-09 MED ORDER — FUROSEMIDE 80 MG PO TABS: 80.0000 mg | ORAL_TABLET | Freq: Two times a day (BID) | ORAL | 6 refills | Status: AC

## 2016-09-09 MED ORDER — NYSTATIN 100000 UNIT/GM EX CREA: 1.0000 "application " | TOPICAL_CREAM | Freq: Four times a day (QID) | CUTANEOUS | 0 refills | Status: AC

## 2016-09-09 MED ORDER — FUROSEMIDE 10 MG/ML IJ SOLN
80.0000 mg | Freq: Once | INTRAMUSCULAR | Status: AC
  Administered 2016-09-09: 80 mg via INTRAVENOUS
  Filled 2016-09-09: qty 8

## 2016-09-09 NOTE — ED Triage Notes (Signed)
Legs have been swollen and painful x 3 weeks. 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 She has been taking her Lasix with no improvement in the swelling.

## 2016-09-09 NOTE — ED Provider Notes (Signed)
[facility] DEPT [facility] Provider Note   CSN: [id number] Arrival date & time: 09/09/16  1752 By signing my name below, I, Dyke Brackett, attest that this documentation has been prepared under the direction and in the presence of Merrily Pew, MD . Electronically Signed: Dyke Brackett, Scribe. 09/09/2016. 6:40 PM.   History   Chief Complaint Chief Complaint  Patient presents with  . Leg Swelling    HPI Charlestine Skehan is a 65 y.o. female with hx of TIA, CAD, CHF, and HTN who presents to the Emergency Department complaining of worsening bilateral leg swelling onset three weeks ago. Per daughter, pt has taken Lasix with no relief. Pt is followed at [facility]. She also notes associated worsening SOB, mild chest pain, and weight gain. She has gained about 25 pounds in two weeks. She states SOB is exacerbated by laying down. Pt denies any fever.   The history is provided by the patient. No language interpreter was used.   Past Medical History:  Diagnosis Date  . Arthritis   . Atrial flutter ([place])    S/p TEE DCCV 07/16/16  . Bipolar 1 disorder ([place])   . CAD (coronary artery disease)   . CHF (congestive heart failure) ([place])   . Depression   . Hypertension   . Hypothyroidism   . Obesity   . S/P CABG x 3   . Thyroid ca ([place])   . Thyroid disease   . TIA (transient ischemic attack) 07/13/2016   Patient Active Problem List   Diagnosis Date Noted  . Hypertension   . S/P CABG x 3   . 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 Anemia, iron deficiency 07/22/2016  . PFO (patent foramen ovale) 07/22/2016  . HX: anticoagulation 07/22/2016  . Hypoxia 07/19/2016  . Chronic respiratory failure with hypoxia ([place])   . Cerebral thrombosis with cerebral infarction 07/14/2016  . Transient cerebral ischemia   . Morbid obesity ([place])   . Atrial tachycardia ([place]) 07/13/2016  . Atrial flutter ([place]) 07/13/2016  . Slurred speech 07/13/2016  . Chronic diastolic heart failure ([place]) 11/05/2015  . Facial droop  10/06/2015  . Hypothyroidism   . CAD in native artery   . Bipolar affective disorder, manic ([place]) 07/08/2015  . COPD (chronic obstructive pulmonary disease) ([place]) 06/26/2015  . 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 Chronic anemia 06/23/2015  . Post-operative hypothyroidism 06/23/2015  . Shortness of breath 06/23/2015  . Pulmonary edema 06/22/2015    Past Surgical History:  Procedure Laterality Date  . ABDOMINAL HYSTERECTOMY    . CARDIOVERSION N/A 07/16/2016   Procedure: CARDIOVERSION;  Surgeon: Jerline Pain, MD;  Location: [place] [facility];  Service: Cardiovascular;  Laterality: N/A;  . CORONARY ARTERY BYPASS GRAFT    . TEE WITHOUT CARDIOVERSION N/A 07/16/2016   Procedure: TRANSESOPHAGEAL ECHOCARDIOGRAM (TEE);  Surgeon: Jerline Pain, MD;  Location: [facility] ENDOSCOPY;  Service: Cardiovascular;  Laterality: N/A;  . TONSILLECTOMY      OB History    No data available       Home Medications    Prior to Admission medications   Medication Sig Start Date End Date Taking? Authorizing Provider  acetaminophen (RA ACETAMINOPHEN) 650 MG CR tablet Take 650 mg by mouth every 8 (eight) hours as needed for pain. pain 03/16/13   Historical Provider, MD  albuterol (PROVENTIL HFA;VENTOLIN HFA) 108 (90 BASE) MCG/ACT inhaler Inhale 2 puffs into the lungs every 6 (six) hours as needed for wheezing  or shortness of breath. 06/26/15   Donne Hazel, MD  amiodarone (PACERONE) 200 MG tablet Take 1 tablet (200 mg total) by mouth 2 (two) times daily. 07/18/16   Barton Dubois, MD  aspirin EC 81 MG EC tablet Take 1 tablet (81 mg total) by mouth daily. 07/18/16   Barton Dubois, MD  atorvastatin (LIPITOR) 20 MG tablet Take 1 tablet (20 mg total) by mouth at bedtime. 07/18/16   Barton Dubois, MD  bisacodyl (DULCOLAX) 5 MG EC tablet Take 5 mg by mouth every 12 (twelve) hours as needed for mild constipation.     Historical Provider, MD    divalproex (DEPAKOTE ER) 500 MG 24 hr tablet Take 1,000 mg by mouth 2 (two) times daily.    Historical Provider, MD  ferrous sulfate 325 (65 FE) MG tablet Take 325 mg by mouth daily with breakfast.    Historical Provider, MD  furosemide (LASIX) 80 MG tablet Take 1 tablet (80 mg total) by mouth 2 (two) times daily. 09/09/16   Merrily Pew, MD  gabapentin (NEURONTIN) 600 MG tablet Take 600 mg by mouth 2 (two) times daily.    Historical Provider, MD  levothyroxine (SYNTHROID, LEVOTHROID) 125 MCG tablet Take 125 mcg by mouth daily before breakfast.    Historical Provider, MD  nystatin cream (MYCOSTATIN) Apply 1 application topically 4 (four) times daily. Apply to affected area every 4-6 hours x 10 days 09/09/16   Merrily Pew, MD  Omega-3 Fatty Acids (FISH OIL) 1000 MG CAPS Take 1,000 mg by mouth daily.    Historical Provider, MD  polyethylene glycol (MIRALAX / GLYCOLAX) packet Take 17 g by mouth daily. 07/24/16   Belkys A Regalado, MD  potassium chloride SA (K-DUR,KLOR-CON) 20 MEQ tablet Take 1 tablet (20 mEq total) by mouth 2 (two) times daily. 08/18/16   Amy D Clegg, NP  risperiDONE (RISPERDAL) 2 MG tablet Take 2 mg by mouth at bedtime.    Historical Provider, MD  rivaroxaban (XARELTO) 20 MG TABS tablet Take 1 tablet (20 mg total) by mouth daily with supper. 07/18/16   Barton Dubois, MD  traMADol (ULTRAM) 50 MG tablet Take 50 mg by mouth every 8 (eight) hours as needed for severe pain.    Historical Provider, MD  zolpidem (AMBIEN) 5 MG tablet Take 5 mg by mouth at bedtime.    Historical Provider, MD   Family History Family History  Problem Relation Age of Onset  . Hypertension Sister   . Bipolar disorder Other    Social History Social History  Substance Use Topics  . Smoking status: Never Smoker  . Smokeless tobacco: Never Used  . Alcohol use No   Allergies   Methimazole; Propylthiouracil; Chocolate; Other; Peanut-containing drug products; Penicillins; and Vicodin  [hydrocodone-acetaminophen]  Review of Systems Review of Systems  Constitutional: Positive for unexpected weight change. Negative for fever.  Respiratory: Positive for shortness of breath.   Cardiovascular: Positive for chest pain and leg swelling.  All other systems reviewed and are negative.  Physical Exam Updated Vital Signs BP 103/73 (BP Location: Right Arm)   Pulse (!) 54   Temp 98.4 F (36.9 C) (Oral)   Resp 20   Ht 5' (1.524 m)   Wt 285 lb 6 oz (129.4 kg)   SpO2 98%   BMI 55.73 kg/m   Physical Exam  Constitutional: She is oriented to person, place, and time. She appears well-developed and well-nourished. No distress.  HENT:  Head: Normocephalic and atraumatic.  Eyes: Conjunctivae are normal.  Cardiovascular: Normal rate, regular rhythm and normal heart sounds.  Exam reveals no gallop and no friction rub.   No murmur heard. Pulmonary/Chest: Effort normal and breath sounds normal. Tachypnea noted. No respiratory distress. She has no wheezes. She has no rales. She exhibits no tenderness.  Abdominal: Soft. She exhibits no distension and no mass. There is no tenderness. There is no rebound and no guarding.  Musculoskeletal: She exhibits edema.  Trace pitting edema to mid shin   Neurological: She is alert and oriented to person, place, and time.  Skin: Skin is warm and dry. Rash noted.  4 cm rash around periumbilical  with satellite regions; erythematous and dry. No rash to bilateral legs  Psychiatric: She has a normal mood and affect.  Nursing note and vitals reviewed.  ED Treatments / Results  DIAGNOSTIC STUDIES:  Oxygen Saturation is 96% on RA, adequate by my interpretation.    COORDINATION OF CARE:  6:36 PM Will order CBC, troponin, BMP, and brain natriuretic peptide. Discussed treatment plan with pt at bedside and pt agreed to plan.   Labs (all labs ordered are listed, but only abnormal results are displayed) Labs Reviewed  CBC WITH DIFFERENTIAL/PLATELET -  Abnormal; Notable for the following:       Result Value   Hemoglobin 11.1 (*)    RDW 20.2 (*)    All other components within normal limits  BASIC METABOLIC PANEL - Abnormal; Notable for the following:    CO2 33 (*)    BUN 23 (*)    Creatinine, Ser 1.08 (*)    Calcium 8.5 (*)    GFR calc non Af Amer 53 (*)    Anion gap 4 (*)    All other components within normal limits  TROPONIN I  BRAIN NATRIURETIC PEPTIDE    EKG  EKG Interpretation  Date/Time:  Thursday September 09 2016 18:51:06 EDT Ventricular Rate:  82 PR Interval:    QRS Duration: 97 QT Interval:  574 QTC Calculation: 691 R Axis:   -9 Text Interpretation:  Atrial fibrillation RSR' in V1 or V2, right VCD or RVH Probable inferior infarct, age indeterminate Lateral leads are also involved Prolonged QT interval Baseline wander in lead(s) I II aVR V2 Confirmed by [facility] MD, Corene Cornea [phone]) on 09/09/2016 7:01:40 PM      Radiology Dg Chest 2 View  Result Date: 09/09/2016 CLINICAL DATA:  Chest pain and shortness of breath over the last several weeks. Stroke an myocardial infarction 3 weeks ago EXAM: CHEST  2 VIEW COMPARISON:  07/19/2016.  04/07/2016. FINDINGS: Previous median sternotomy. Heart size is normal. There is atherosclerosis of the aorta. There is a background pattern of abnormal interstitial lung markings particularly on the left, but these are more pronounced than were seen previously, consistent with active atelectasis or patchy infiltrate. I think there is also probably pulmonary venous hypertension. No effusions. No acute bone finding. IMPRESSION: Background pattern of chronic interstitial lung markings which are more prominent on today's study. This could be due to simple atelectasis or atelectatic patchy pneumonia. 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 Electronically Signed   By: Nelson Chimes M.D.   On: 09/09/2016 19:23    Procedures Procedures (including critical care time)  Medications Ordered in  ED Medications  furosemide (LASIX) injection 80 mg (80 mg Intravenous Given 09/09/16 2138)     Initial Impression / Assessment and Plan / ED Course  I have reviewed the triage vital signs and the nursing notes.  Pertinent labs & imaging  results that were available during my care of the patient were reviewed by me and considered in my medical decision making (see chart for details).  Clinical Course   Likely mild CHF exac. No e/o pulmonary edema or severe heart failure. Diuresed significantly here with 80 IV lasix, suspect non compliance 2/2 recent homeless status. Has medicare/medicaid so should be able to get meds filled now.   Final Clinical Impressions(s) / ED Diagnoses   Final diagnoses:  Peripheral edema   New Prescriptions New Prescriptions   NYSTATIN CREAM (MYCOSTATIN)    Apply 1 application topically 4 (four) times daily. Apply to affected area every 4-6 hours x 10 days  I personally performed the services described in this documentation, which was scribed in my presence. The recorded information has been reviewed and is accurate.     Merrily Pew, MD 09/09/16 [phone]

## 2016-09-10 NOTE — ED Notes (Signed)
Per family the patient can get her medications  - and the patient will be staying with her sister tonight

## 2016-09-23 ENCOUNTER — Other Ambulatory Visit: Payer: Self-pay | Admitting: Nurse Practitioner

## 2016-10-05 IMAGING — CT CT HEAD W/O CM
3 series · 16 of 47 positions shown, 19 images · non-contrast
Comparison: MRI 10/07/2015

CLINICAL DATA: Slurred speech beginning last night.

EXAM:
CT HEAD WITHOUT CONTRAST
TECHNIQUE: Contiguous axial images were obtained from the base of the skull
through the vertex without intravenous contrast.

[Series 2: head wo · axial · 0.39mm/px · z∈[-111,+44]mm · 10 of 37 slices shown, 13 images]
[im 3/37  brain]
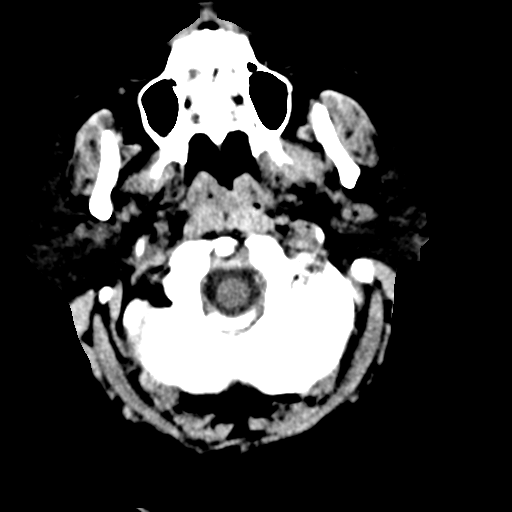
[im 3/37  bone]
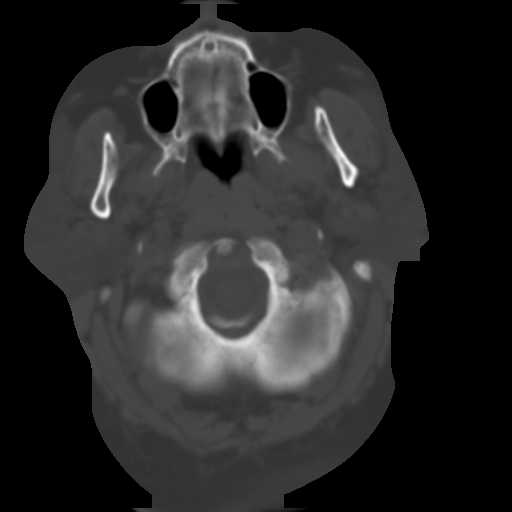
[im 7/37  brain]
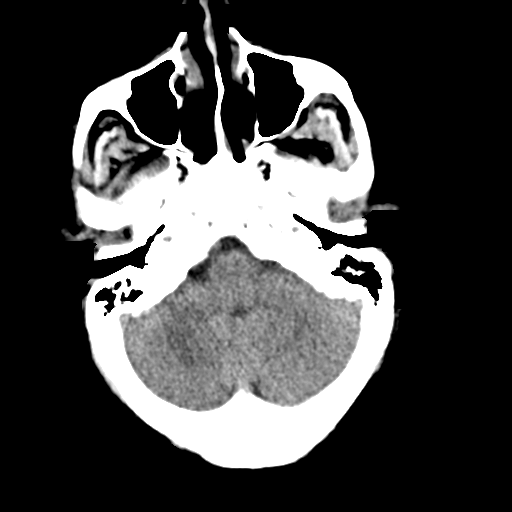
[im 10/37  brain]
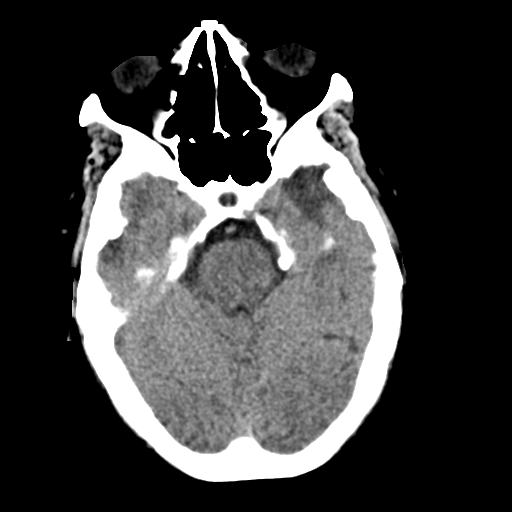
[im 13/37  brain]
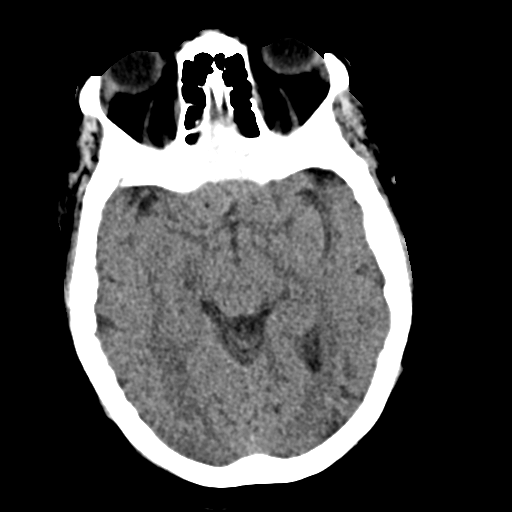
[im 17/37  brain]
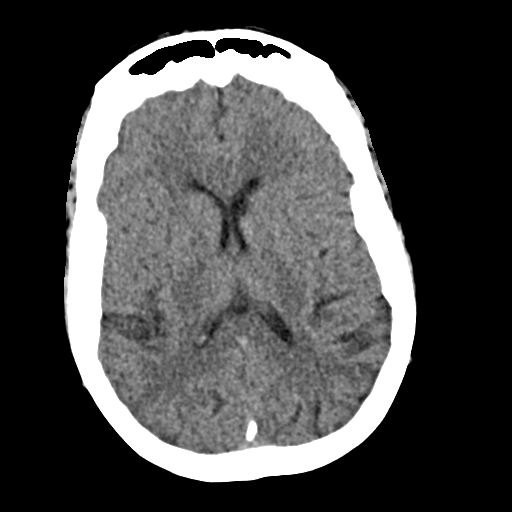
[im 17/37  bone]
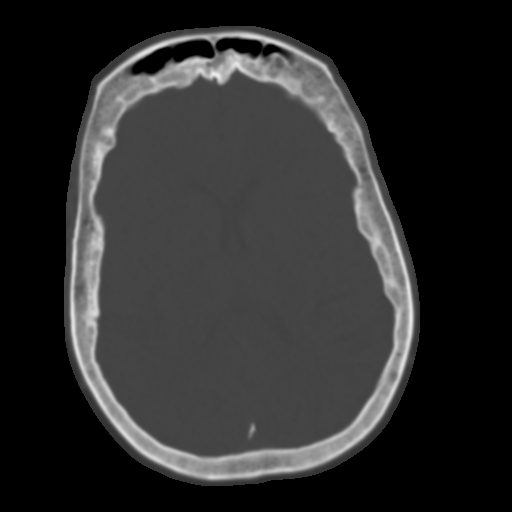
[im 20/37  brain]
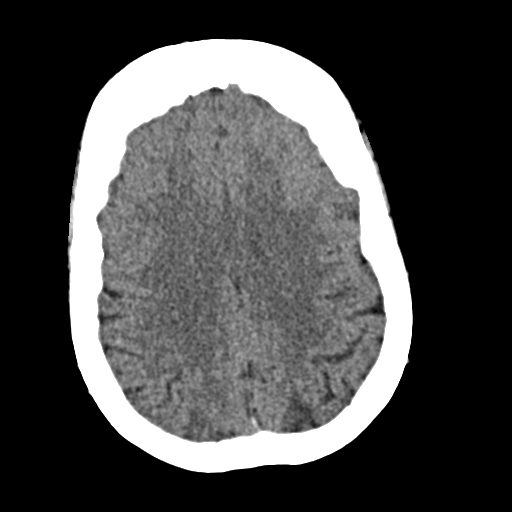
[im 24/37  brain]
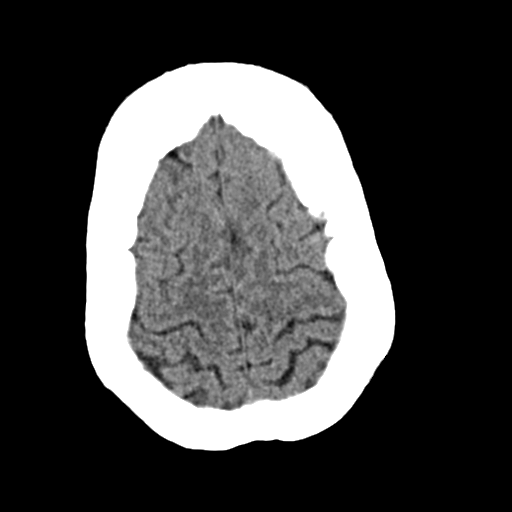
[im 28/37  brain]
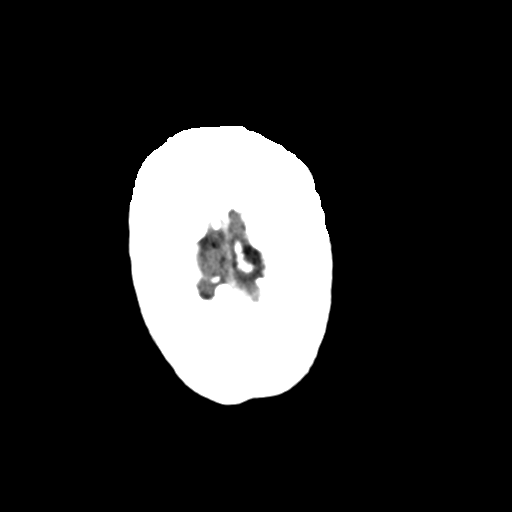
[im 30/37  brain]
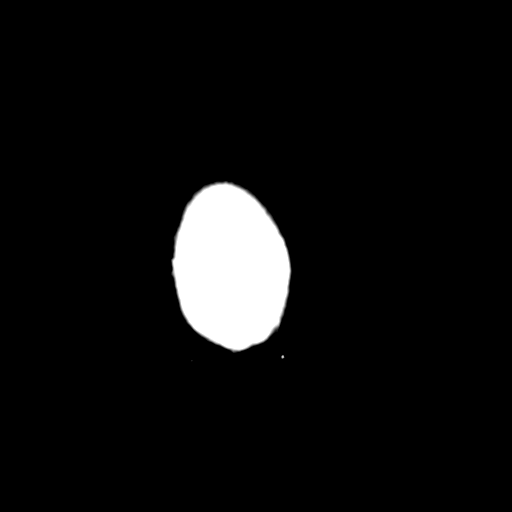
[im 30/37  bone]
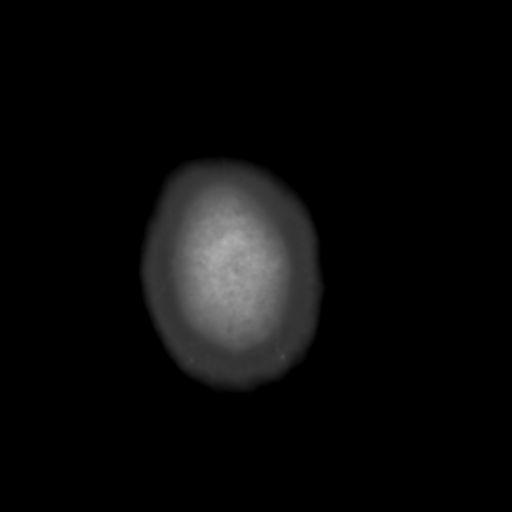
[im 34/37  brain]
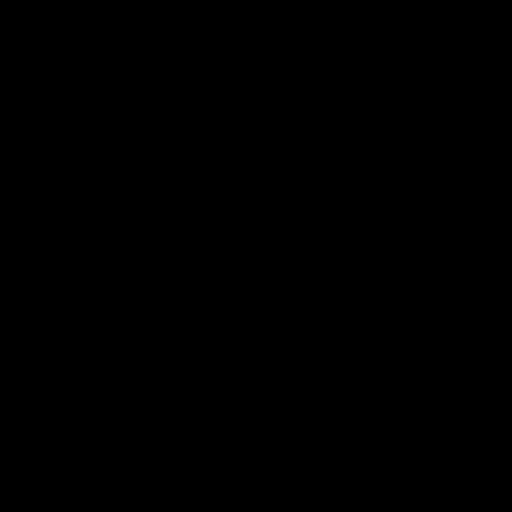

[Series 4: coronal soft · coronal · 0.34mm/px · 3 of 66 slices shown]
[im 22/66  brain]
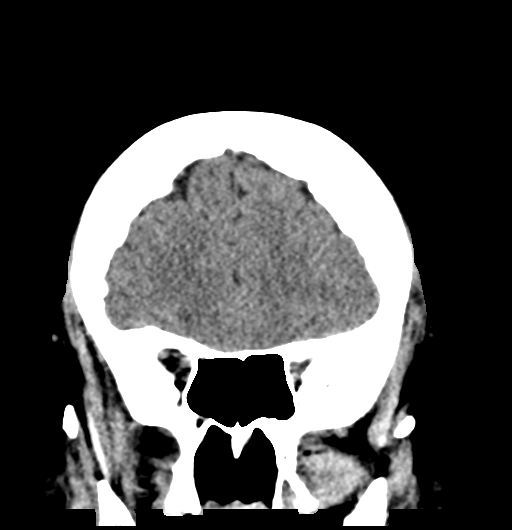
[im 29/66  brain]
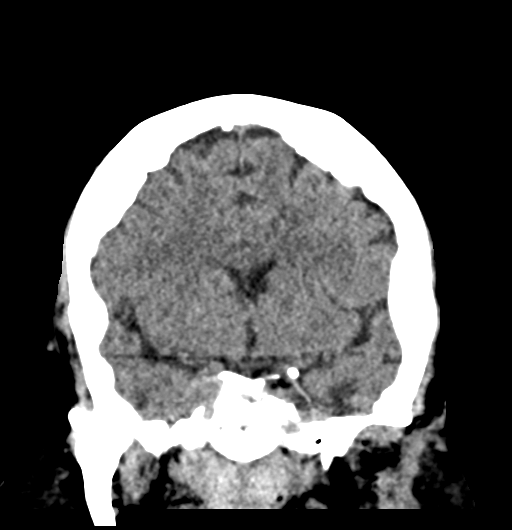
[im 37/66  brain]
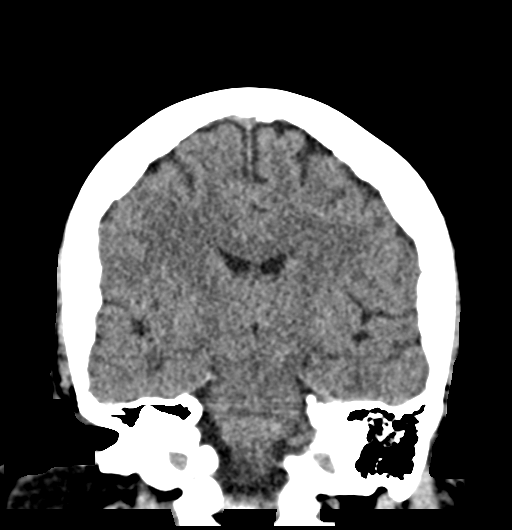

[Series 5: sag soft · sagittal · 0.36mm/px · 3 of 67 slices shown]
[im 23/67  brain]
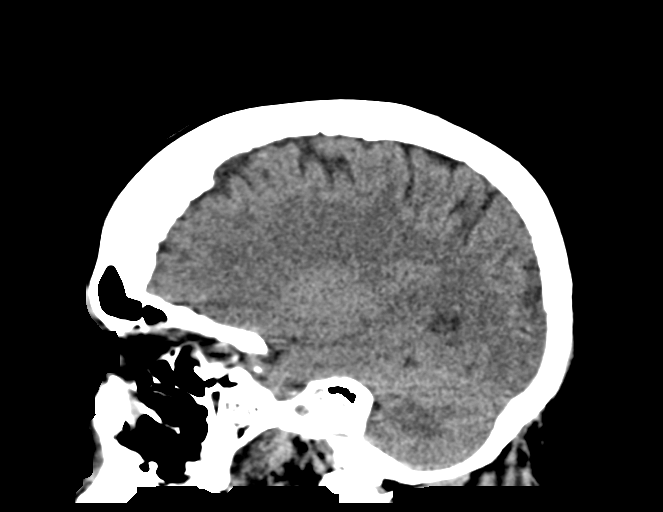
[im 34/67  brain]
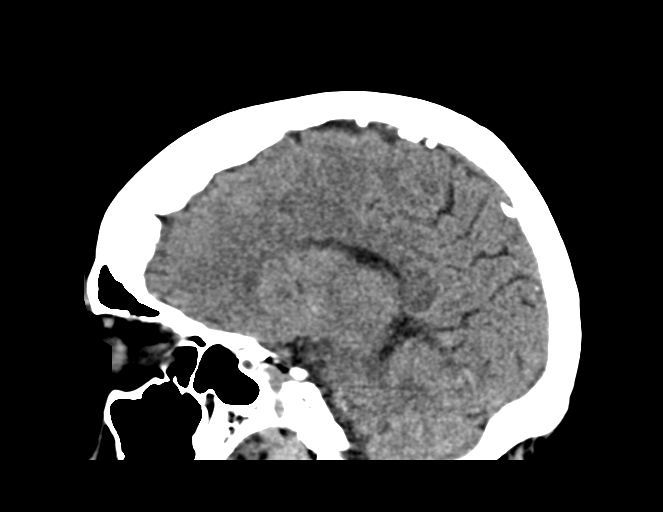
[im 45/67  brain]
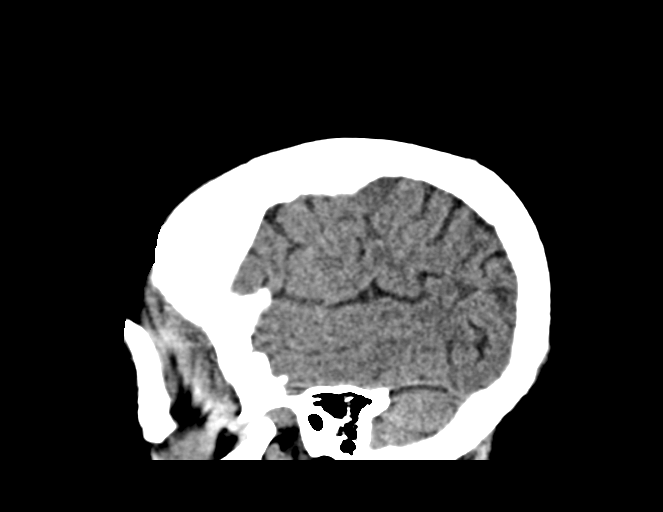

[16 of 47 positions shown; findings below may reference images not displayed]

FINDINGS: Brain: No acute intracranial abnormality. Specifically, no
hemorrhage, hydrocephalus, mass lesion, acute infarction, or
significant intracranial injury.

Vascular: No hyperdense vessel or unexpected calcification.

Skull: No acute calvarial abnormality

Sinuses/Orbits: Visualized paranasal sinuses and mastoids clear.
Orbital soft tissues unremarkable.

Other: None
IMPRESSION: No acute intracranial abnormality.

## 2016-10-06 ENCOUNTER — Other Ambulatory Visit: Payer: Self-pay | Admitting: Nurse Practitioner

## 2016-10-16 ENCOUNTER — Other Ambulatory Visit: Payer: Self-pay | Admitting: Nurse Practitioner

## 2016-11-04 ENCOUNTER — Encounter (HOSPITAL_BASED_OUTPATIENT_CLINIC_OR_DEPARTMENT_OTHER): Payer: Self-pay | Admitting: *Deleted

## 2016-11-04 ENCOUNTER — Emergency Department (HOSPITAL_BASED_OUTPATIENT_CLINIC_OR_DEPARTMENT_OTHER)
Admission: EM | Admit: 2016-11-04 | Discharge: 2016-11-04 | Disposition: A | Discharge status: Home / Self Care | Payer: Medicare Other | Attending: Emergency Medicine | Admitting: Emergency Medicine

## 2016-11-04 DIAGNOSIS — I5040 Unspecified combined systolic (congestive) and diastolic (congestive) heart failure: Secondary | ICD-10-CM | POA: Insufficient documentation

## 2016-11-04 DIAGNOSIS — B372 Candidiasis of skin and nail: Secondary | ICD-10-CM | POA: Diagnosis not present

## 2016-11-04 DIAGNOSIS — J449 Chronic obstructive pulmonary disease, unspecified: Secondary | ICD-10-CM | POA: Insufficient documentation

## 2016-11-04 DIAGNOSIS — Z79899 Other long term (current) drug therapy: Secondary | ICD-10-CM | POA: Diagnosis not present

## 2016-11-04 DIAGNOSIS — R21 Rash and other nonspecific skin eruption: Secondary | ICD-10-CM | POA: Diagnosis present

## 2016-11-04 DIAGNOSIS — I2510 Atherosclerotic heart disease of native coronary artery without angina pectoris: Secondary | ICD-10-CM | POA: Diagnosis not present

## 2016-11-04 DIAGNOSIS — I110 Hypertensive heart disease with heart failure: Secondary | ICD-10-CM | POA: Insufficient documentation

## 2016-11-04 HISTORY — DX: Chronic obstructive pulmonary disease, unspecified: J44.9

## 2016-11-04 HISTORY — DX: Cerebral infarction, unspecified: I63.9

## 2016-11-04 LAB — COMPREHENSIVE METABOLIC PANEL
ALK PHOS: 53 U/L (ref 38–126)
ALT: 8 U/L — AB (ref 14–54)
AST: 14 U/L — ABNORMAL LOW (ref 15–41)
Albumin: 2.9 g/dL — ABNORMAL LOW (ref 3.5–5.0)
Anion gap: 8 (ref 5–15)
BILIRUBIN TOTAL: 0.3 mg/dL (ref 0.3–1.2)
BUN: 14 mg/dL (ref 6–20)
CALCIUM: 8.7 mg/dL — AB (ref 8.9–10.3)
CO2: 37 mmol/L — ABNORMAL HIGH (ref 22–32)
CREATININE: 0.95 mg/dL (ref 0.44–1.00)
Chloride: 96 mmol/L — ABNORMAL LOW (ref 101–111)
Glucose, Bld: 102 mg/dL — ABNORMAL HIGH (ref 65–99)
Potassium: 3.7 mmol/L (ref 3.5–5.1)
Sodium: 141 mmol/L (ref 135–145)
TOTAL PROTEIN: 7.5 g/dL (ref 6.5–8.1)

## 2016-11-04 LAB — URINALYSIS, MICROSCOPIC (REFLEX)

## 2016-11-04 LAB — CBC WITH DIFFERENTIAL/PLATELET
Basophils Absolute: 0 10*3/uL (ref 0.0–0.1)
Basophils Relative: 1 %
Eosinophils Absolute: 0.3 10*3/uL (ref 0.0–0.7)
Eosinophils Relative: 5 %
HCT: 33.8 % — ABNORMAL LOW (ref 36.0–46.0)
HEMOGLOBIN: 10 g/dL — AB (ref 12.0–15.0)
LYMPHS ABS: 1.4 10*3/uL (ref 0.7–4.0)
LYMPHS PCT: 23 %
MCH: 28.2 pg (ref 26.0–34.0)
MCHC: 29.6 g/dL — ABNORMAL LOW (ref 30.0–36.0)
MCV: 95.5 fL (ref 78.0–100.0)
MONOS PCT: 16 %
Monocytes Absolute: 1 10*3/uL (ref 0.1–1.0)
NEUTROS PCT: 55 %
Neutro Abs: 3.3 10*3/uL (ref 1.7–7.7)
Platelets: 244 10*3/uL (ref 150–400)
RBC: 3.54 MIL/uL — AB (ref 3.87–5.11)
RDW: 19.6 % — ABNORMAL HIGH (ref 11.5–15.5)
WBC: 5.9 10*3/uL (ref 4.0–10.5)

## 2016-11-04 LAB — I-STAT CG4 LACTIC ACID, ED: LACTIC ACID, VENOUS: 2.16 mmol/L — AB (ref 0.5–1.9)

## 2016-11-04 LAB — URINALYSIS, ROUTINE W REFLEX MICROSCOPIC
Bilirubin Urine: NEGATIVE
GLUCOSE, UA: NEGATIVE mg/dL
KETONES UR: NEGATIVE mg/dL
Leukocytes, UA: NEGATIVE
Nitrite: NEGATIVE
PROTEIN: 30 mg/dL — AB
Specific Gravity, Urine: 1.027 (ref 1.005–1.030)
pH: 7 (ref 5.0–8.0)

## 2016-11-04 MED ORDER — TRAMADOL HCL 50 MG PO TABS: 50.0000 mg | ORAL_TABLET | Freq: Four times a day (QID) | ORAL | 0 refills | Status: AC | PRN

## 2016-11-04 MED ORDER — NYSTATIN-TRIAMCINOLONE 100000-0.1 UNIT/GM-% EX CREA: TOPICAL_CREAM | CUTANEOUS | 0 refills | Status: DC

## 2016-11-04 MED ORDER — FLUCONAZOLE 100 MG PO TABS: 100.0000 mg | ORAL_TABLET | Freq: Every day | ORAL | 0 refills | Status: DC

## 2016-11-04 MED ORDER — FLUCONAZOLE 100 MG PO TABS
100.0000 mg | ORAL_TABLET | Freq: Once | ORAL | Status: AC
  Administered 2016-11-04: 100 mg via ORAL
  Filled 2016-11-04: qty 1

## 2016-11-04 MED ORDER — TRIAMCINOLONE ACETONIDE 0.1 % EX CREA: 1.0000 "application " | TOPICAL_CREAM | Freq: Two times a day (BID) | CUTANEOUS | 0 refills | Status: AC

## 2016-11-04 MED ORDER — MORPHINE SULFATE (PF) 4 MG/ML IV SOLN
4.0000 mg | Freq: Once | INTRAVENOUS | Status: AC
  Administered 2016-11-04: 4 mg via INTRAVENOUS
  Filled 2016-11-04: qty 1

## 2016-11-04 MED ORDER — NYSTATIN 100000 UNIT/GM EX CREA: TOPICAL_CREAM | CUTANEOUS | 0 refills | Status: AC

## 2016-11-04 MED ORDER — SODIUM CHLORIDE 0.9 % IV BOLUS (SEPSIS): 500.0000 mL | Freq: Once | INTRAVENOUS | Status: DC

## 2016-11-04 MED FILL — NYSTATIN 100,000 UNIT/GM CR: 100000 | 15 days supply | Qty: 30 | Fill #0

## 2016-11-04 MED FILL — FLUCONAZOLE 100 MG TABLET: 100 | 15 days supply | Qty: 15 | Fill #0

## 2016-11-04 MED FILL — TRIAMCINOLONE 0.1% CREAM: 0.1 | 15 days supply | Qty: 30 | Fill #0

## 2016-11-04 MED FILL — traMADol HCL 50 MG TABS: 50 | 2 days supply | Qty: 8 | Fill #0

## 2016-11-04 NOTE — ED Triage Notes (Signed)
Patient has a chronic extensive generalized rash over her abdomen and perineum areas.  States she has been treated for the same for "years", and started new medications two weeks ago.  Over the last three months the rash has been worse. Was seen at the [facility] in [place], [place]

## 2016-11-04 NOTE — ED Provider Notes (Signed)
[facility] DEPT MHP Provider Note   CSN: [id number] Arrival date & time: 11/04/16  0806     History   Chief Complaint Chief Complaint  Patient presents with  . Rash    HPI Tonya Hogan is a 65 y.o. female hx of aflutter s/p cardioversion, CAD, CHF, COPD on chronic oxygen therapy, here with worsening rash. Patient states that she's been having a rash under her breasts as well as the pelvic area for the last several years. Over the last several months, she has been seeing her primary care doctor has been trying multiple over-the-counter creams. Over the last Month or so she has been using nystatin powder and bactroban with minimal relief. She saw primary care doctor yesterday and was told to come to the ER for further evaluation. She denies any fevers or chills. She states that she was in a lot of pain from the rash and there is some whitish discharge from the rash.    The history is provided by the patient.    Past Medical History:  Diagnosis Date  . Arthritis   . Atrial flutter ([place])    S/p TEE DCCV 07/16/16  . Bipolar 1 disorder ([place])   . CAD (coronary artery disease)   . CHF (congestive heart failure) ([place])   . COPD (chronic obstructive pulmonary disease) ([place])   . Depression   . Hypertension   . Hypothyroidism   . Obesity   . S/P CABG x 3   . Stroke ([facility])    TIA  . Thyroid ca ([place])   . Thyroid disease   . TIA (transient ischemic attack) 07/13/2016    Patient Active Problem List   Diagnosis Date Noted  . Hypertension   . S/P CABG x 3   . 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 Anemia, iron deficiency 07/22/2016  . PFO (patent foramen ovale) 07/22/2016  . HX: anticoagulation 07/22/2016  . Hypoxia 07/19/2016  . Chronic respiratory failure with hypoxia ([place])   . Cerebral thrombosis with cerebral infarction 07/14/2016  . Transient cerebral ischemia   . Morbid obesity ([place])   . Atrial tachycardia ([place]) 07/13/2016  . Atrial flutter ([place]) 07/13/2016  . Slurred speech  07/13/2016  . Chronic diastolic heart failure ([place]) 11/05/2015  . Facial droop 10/06/2015  . Hypothyroidism   . CAD in native artery   . Bipolar affective disorder, manic ([place]) 07/08/2015  . COPD (chronic obstructive pulmonary disease) ([place]) 06/26/2015  . 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 Chronic anemia 06/23/2015  . Post-operative hypothyroidism 06/23/2015  . Shortness of breath 06/23/2015  . Pulmonary edema 06/22/2015    Past Surgical History:  Procedure Laterality Date  . ABDOMINAL HYSTERECTOMY    . CARDIOVERSION N/A 07/16/2016   Procedure: CARDIOVERSION;  Surgeon: Jerline Pain, MD;  Location: [facility];  Service: Cardiovascular;  Laterality: N/A;  . CORONARY ARTERY BYPASS GRAFT    . TEE WITHOUT CARDIOVERSION N/A 07/16/2016   Procedure: TRANSESOPHAGEAL ECHOCARDIOGRAM (TEE);  Surgeon: Jerline Pain, MD;  Location: [facility] ENDOSCOPY;  Service: Cardiovascular;  Laterality: N/A;  . TONSILLECTOMY      OB History    No data available       Home Medications    Prior to Admission medications   Medication Sig Start Date End Date Taking? Authorizing Provider  acetaminophen (RA ACETAMINOPHEN) 650 MG CR tablet Take 650 mg by mouth every 8 (eight) hours as needed for pain. pain 03/16/13  Yes Historical Provider, MD  albuterol (PROVENTIL HFA;VENTOLIN HFA) 108 (90 BASE) MCG/ACT inhaler Inhale 2 puffs into the lungs every 6 (six) hours as needed for wheezing or shortness of breath. 06/26/15  Yes Donne Hazel, MD  amiodarone (PACERONE) 200 MG tablet Take 1 tablet (200 mg total) by mouth 2 (two) times daily. 07/18/16  Yes Barton Dubois, MD  aspirin EC 81 MG EC tablet Take 1 tablet (81 mg total) by mouth daily. 07/18/16  Yes Barton Dubois, MD  atorvastatin (LIPITOR) 20 MG tablet Take 1 tablet (20 mg total) by mouth at bedtime. 07/18/16  Yes Barton Dubois, MD  bisacodyl (DULCOLAX) 5 MG EC tablet Take 5 mg  by mouth every 12 (twelve) hours as needed for mild constipation.    Yes Historical Provider, MD  divalproex (DEPAKOTE ER) 500 MG 24 hr tablet Take 1,000 mg by mouth 2 (two) times daily.   Yes Historical Provider, MD  ferrous sulfate 325 (65 FE) MG tablet Take 325 mg by mouth daily with breakfast.   Yes Historical Provider, MD  furosemide (LASIX) 80 MG tablet Take 1 tablet (80 mg total) by mouth 2 (two) times daily. 09/09/16  Yes Merrily Pew, MD  gabapentin (NEURONTIN) 600 MG tablet Take 600 mg by mouth 2 (two) times daily.   Yes Historical Provider, MD  levothyroxine (SYNTHROID, LEVOTHROID) 125 MCG tablet Take 125 mcg by mouth daily before breakfast.   Yes Historical Provider, MD  nystatin cream (MYCOSTATIN) Apply 1 application topically 4 (four) times daily. Apply to affected area every 4-6 hours x 10 days 09/09/16  Yes Merrily Pew, MD  Omega-3 Fatty Acids (FISH OIL) 1000 MG CAPS Take 1,000 mg by mouth daily.   Yes Historical Provider, MD  polyethylene glycol (MIRALAX / GLYCOLAX) packet Take 17 g by mouth daily. 07/24/16  Yes Belkys A Regalado, MD  potassium chloride SA (K-DUR,KLOR-CON) 20 MEQ tablet Take 1 tablet (20 mEq total) by mouth 2 (two) times daily. 08/18/16  Yes Amy D Clegg, NP  risperiDONE (RISPERDAL) 2 MG tablet Take 2 mg by mouth at bedtime.   Yes Historical Provider, MD  rivaroxaban (XARELTO) 20 MG TABS tablet Take 1 tablet (20 mg total) by mouth daily with supper. 07/18/16  Yes Barton Dubois, MD  traMADol (ULTRAM) 50 MG tablet Take 50 mg by mouth every 8 (eight) hours as needed for severe pain.   Yes Historical Provider, MD  zolpidem (AMBIEN) 5 MG tablet Take 5 mg by mouth at bedtime.   Yes Historical Provider, MD    Family History Family History  Problem Relation Age of Onset  . Hypertension Sister   . Bipolar disorder Other     Social History Social History  Substance Use Topics  . Smoking status: Never Smoker  . Smokeless tobacco: Never Used  . Alcohol use No      Allergies   Methimazole; Propylthiouracil; Chocolate; Other; Peanut-containing drug products; Penicillins; and Vicodin [hydrocodone-acetaminophen]   Review of Systems Review of Systems  Skin: Positive for rash.  All other systems reviewed and are negative.    Physical Exam Updated Vital Signs BP (!) 104/27 (BP Location: Right Arm)   Pulse 67   Temp 98.4 F (36.9 C) (Oral)   Resp 22   Ht 5' (1.524 m)   Wt 272 lb (123.4 kg)   SpO2 94%   BMI 53.12 kg/m   Physical Exam  Constitutional: She is oriented to person, place, and time.  Chronically ill, obese   HENT:  Head:  Normocephalic.  Mouth/Throat: Oropharynx is clear and moist.  Eyes: EOM are normal. Pupils are equal, round, and reactive to light.  Neck: Normal range of motion. Neck supple.  Cardiovascular: Normal rate, regular rhythm and normal heart sounds.   Pulmonary/Chest: Effort normal and breath sounds normal. No respiratory distress. She has no wheezes.  Abdominal: Soft. Bowel sounds are normal.  Musculoskeletal: Normal range of motion.  Neurological: She is alert and oriented to person, place, and time.  Skin:  Rash under bilateral breasts and panus under the abdomen and in the perineal area. Some vesicles, whitish discharge as well. No obvious fluctuance   Psychiatric: She has a normal mood and affect.  Nursing note and vitals reviewed.    ED Treatments / Results  Labs (all labs ordered are listed, but only abnormal results are displayed) Labs Reviewed  CBC WITH DIFFERENTIAL/PLATELET - Abnormal; Notable for the following:       Result Value   RBC 3.54 (*)    Hemoglobin 10.0 (*)    HCT 33.8 (*)    MCHC 29.6 (*)    RDW 19.6 (*)    All other components within normal limits  COMPREHENSIVE METABOLIC PANEL - Abnormal; Notable for the following:    Chloride 96 (*)    CO2 37 (*)    Glucose, Bld 102 (*)    Calcium 8.7 (*)    Albumin 2.9 (*)    AST 14 (*)    ALT 8 (*)    All other components within  normal limits  URINALYSIS, ROUTINE W REFLEX MICROSCOPIC - Abnormal; Notable for the following:    APPearance CLOUDY (*)    Hgb urine dipstick TRACE (*)    Protein, ur 30 (*)    All other components within normal limits  URINALYSIS, MICROSCOPIC (REFLEX) - Abnormal; Notable for the following:    Bacteria, UA FEW (*)    Squamous Epithelial / LPF 6-30 (*)    All other components within normal limits  I-STAT CG4 LACTIC ACID, ED - Abnormal; Notable for the following:    Lactic Acid, Venous 2.16 (*)    All other components within normal limits  URINE CULTURE    EKG  EKG Interpretation None       Radiology No results found.  Procedures Procedures (including critical care time)  Medications Ordered in ED Medications  fluconazole (DIFLUCAN) tablet 100 mg (not administered)  morphine 4 MG/ML injection 4 mg (4 mg Intravenous Given 11/04/16 0917)     Initial Impression / Assessment and Plan / ED Course  I have reviewed the triage vital signs and the nursing notes.  Pertinent labs & imaging results that were available during my care of the patient were reviewed by me and considered in my medical decision making (see chart for details).  Clinical Course    Mimie Barzee is a 65 y.o. female here with persistent candida dermatitis. Has been on bactroban and nystatin cream with no relief. Afebrile, no signs of cellulitis. I think likely intractable candida dermatitis. Has no hx of HIV and not on immunosuppresants. Will check labs and likely change nystatin to nystatin with triamcinolone and also add oral fluconazole for 2 weeks. Recommend dermatology follow up.   9:49 AM WBC nl. Lactate 2.1. She has hx of CHF with EF 35% so likely chronic poor perfusion. Denies more shortness of breath and she is doing well on 2 L [place], which is baseline for her. CMP showed CO2 37, baseline. She has been using vaseline  and I told her to stop using it. Told her to use baby powder or zinc oxide instead. Also  will add oral fluconazole for 2 weeks, nystatin with steroid cream.    Final Clinical Impressions(s) / ED Diagnoses   Final diagnoses:  None    New Prescriptions New Prescriptions   No medications on file     Drenda Freeze, MD 11/04/16 [phone]

## 2016-11-04 NOTE — Discharge Instructions (Signed)
Please try and avoid using vaseline.   Try zinc oxide or baby powder instead.   Stop nystatin cream. Try the nystatin-trimacinolone (Mycolog) cream instead.   Add fluconazole tablet daily for 2 weeks.   Take tramadol as needed for pain.   See your doctor. Consider seeing a dermatologist   Return to ER if you have worse rash, purulent discharge from the rash, fever, trouble breathing, shortness of breath.

## 2016-11-06 LAB — URINE CULTURE

## 2016-11-30 ENCOUNTER — Emergency Department (HOSPITAL_BASED_OUTPATIENT_CLINIC_OR_DEPARTMENT_OTHER)
Admission: EM | Admit: 2016-11-30 | Discharge: 2016-11-30 | Disposition: A | Discharge status: Home / Self Care | Payer: Medicare Other | Attending: Emergency Medicine | Admitting: Emergency Medicine

## 2016-11-30 ENCOUNTER — Encounter (HOSPITAL_BASED_OUTPATIENT_CLINIC_OR_DEPARTMENT_OTHER): Payer: Self-pay | Admitting: Emergency Medicine

## 2016-11-30 DIAGNOSIS — E039 Hypothyroidism, unspecified: Secondary | ICD-10-CM | POA: Insufficient documentation

## 2016-11-30 DIAGNOSIS — B372 Candidiasis of skin and nail: Secondary | ICD-10-CM | POA: Diagnosis not present

## 2016-11-30 DIAGNOSIS — Z951 Presence of aortocoronary bypass graft: Secondary | ICD-10-CM | POA: Diagnosis not present

## 2016-11-30 DIAGNOSIS — I5032 Chronic diastolic (congestive) heart failure: Secondary | ICD-10-CM | POA: Insufficient documentation

## 2016-11-30 DIAGNOSIS — R21 Rash and other nonspecific skin eruption: Secondary | ICD-10-CM | POA: Diagnosis present

## 2016-11-30 DIAGNOSIS — Z7982 Long term (current) use of aspirin: Secondary | ICD-10-CM | POA: Insufficient documentation

## 2016-11-30 DIAGNOSIS — J9611 Chronic respiratory failure with hypoxia: Secondary | ICD-10-CM | POA: Diagnosis not present

## 2016-11-30 DIAGNOSIS — Z7901 Long term (current) use of anticoagulants: Secondary | ICD-10-CM | POA: Diagnosis not present

## 2016-11-30 DIAGNOSIS — J449 Chronic obstructive pulmonary disease, unspecified: Secondary | ICD-10-CM | POA: Diagnosis not present

## 2016-11-30 DIAGNOSIS — I110 Hypertensive heart disease with heart failure: Secondary | ICD-10-CM | POA: Insufficient documentation

## 2016-11-30 DIAGNOSIS — I2510 Atherosclerotic heart disease of native coronary artery without angina pectoris: Secondary | ICD-10-CM | POA: Insufficient documentation

## 2016-11-30 MED ORDER — NYSTATIN-TRIAMCINOLONE 100000-0.1 UNIT/GM-% EX CREA: TOPICAL_CREAM | CUTANEOUS | 0 refills | Status: AC

## 2016-11-30 MED ORDER — FLUCONAZOLE 100 MG PO TABS: 100.0000 mg | ORAL_TABLET | Freq: Every day | ORAL | 0 refills | Status: AC

## 2016-11-30 NOTE — ED Triage Notes (Signed)
Pelvic pain with burning urination for two days.  Pt denies fever.  Painful when wiping after urination.  Pt states her taste buds have changed.  No vaginal discharge.

## 2016-11-30 NOTE — ED Notes (Signed)
ED Provider at bedside. 

## 2016-11-30 NOTE — ED Provider Notes (Signed)
[facility] DEPT Provider Note   CSN: [id number] Arrival date & time: 11/30/16  1003     History   Chief Complaint Chief Complaint  Patient presents with  . Pelvic Pain    HPI Tonya Hogan is a 66 y.o. female.  The history is provided by the patient.  Rash   This is a new problem. Episode onset: >1 month. The problem has been gradually improving. Associated with: diagnosed with candidal dermatitis. There has been no fever. The rash is present on the abdomen, groin, genitalia and torso. The pain is moderate. The pain has been constant since onset. Associated symptoms include pain. Treatments tried: antifungal pill and cream with steroid. The treatment provided moderate relief.   Pt seen here for the same last month and changed to oral fluconazole with nystatin and steroid cream. Improving but not resolved since. Has not been able to follow up with dermatology yet.  Past Medical History:  Diagnosis Date  . Arthritis   . Atrial flutter ([place])    S/p TEE DCCV 07/16/16  . Bipolar 1 disorder ([place])   . CAD (coronary artery disease)   . CHF (congestive heart failure) ([place])   . COPD (chronic obstructive pulmonary disease) ([place])   . Depression   . Hypertension   . Hypothyroidism   . Obesity   . S/P CABG x 3   . Stroke ([facility])    TIA  . Thyroid ca ([place])   . Thyroid disease   . TIA (transient ischemic attack) 07/13/2016    Patient Active Problem List   Diagnosis Date Noted  . Hypertension   . S/P CABG x 3   . 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 Anemia, iron deficiency 07/22/2016  . PFO (patent foramen ovale) 07/22/2016  . HX: anticoagulation 07/22/2016  . Hypoxia 07/19/2016  . Chronic respiratory failure with hypoxia ([place])   . Cerebral thrombosis with cerebral infarction 07/14/2016  . Transient cerebral ischemia   . Morbid obesity ([place])   . Atrial tachycardia ([place]) 07/13/2016  . Atrial flutter ([place]) 07/13/2016  . Slurred speech 07/13/2016  . Chronic diastolic heart failure  ([place]) 11/05/2015  . Facial droop 10/06/2015  . Hypothyroidism   . CAD in native artery   . Bipolar affective disorder, manic ([place]) 07/08/2015  . COPD (chronic obstructive pulmonary disease) ([place]) 06/26/2015  . 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 Chronic anemia 06/23/2015  . Post-operative hypothyroidism 06/23/2015  . Shortness of breath 06/23/2015  . Pulmonary edema 06/22/2015    Past Surgical History:  Procedure Laterality Date  . ABDOMINAL HYSTERECTOMY    . CARDIOVERSION N/A 07/16/2016   Procedure: CARDIOVERSION;  Surgeon: Jerline Pain, MD;  Location: [facility];  Service: Cardiovascular;  Laterality: N/A;  . CORONARY ARTERY BYPASS GRAFT    . TEE WITHOUT CARDIOVERSION N/A 07/16/2016   Procedure: TRANSESOPHAGEAL ECHOCARDIOGRAM (TEE);  Surgeon: Jerline Pain, MD;  Location: [facility] ENDOSCOPY;  Service: Cardiovascular;  Laterality: N/A;  . TONSILLECTOMY      OB History    No data available       Home Medications    Prior to Admission medications   Medication Sig Start Date End Date Taking? Authorizing Provider  acetaminophen (RA ACETAMINOPHEN) 650 MG CR tablet Take 650 mg by mouth every 8 (eight) hours as needed for pain. pain 03/16/13   Historical Provider, MD  albuterol (PROVENTIL HFA;VENTOLIN HFA) 108 (90 BASE) MCG/ACT inhaler Inhale 2 puffs into the  lungs every 6 (six) hours as needed for wheezing or shortness of breath. 06/26/15   Donne Hazel, MD  amiodarone (PACERONE) 200 MG tablet Take 1 tablet (200 mg total) by mouth 2 (two) times daily. 07/18/16   Barton Dubois, MD  aspirin EC 81 MG EC tablet Take 1 tablet (81 mg total) by mouth daily. 07/18/16   Barton Dubois, MD  atorvastatin (LIPITOR) 20 MG tablet Take 1 tablet (20 mg total) by mouth at bedtime. 07/18/16   Barton Dubois, MD  bisacodyl (DULCOLAX) 5 MG EC tablet Take 5 mg by mouth every 12 (twelve) hours as needed for mild  constipation.     Historical Provider, MD  divalproex (DEPAKOTE ER) 500 MG 24 hr tablet Take 1,000 mg by mouth 2 (two) times daily.    Historical Provider, MD  ferrous sulfate 325 (65 FE) MG tablet Take 325 mg by mouth daily with breakfast.    Historical Provider, MD  fluconazole (DIFLUCAN) 100 MG tablet Take 1 tablet (100 mg total) by mouth daily. 11/30/16   Fatima Blank, MD  furosemide (LASIX) 80 MG tablet Take 1 tablet (80 mg total) by mouth 2 (two) times daily. 09/09/16   Merrily Pew, MD  gabapentin (NEURONTIN) 600 MG tablet Take 600 mg by mouth 2 (two) times daily.    Historical Provider, MD  levothyroxine (SYNTHROID, LEVOTHROID) 125 MCG tablet Take 125 mcg by mouth daily before breakfast.    Historical Provider, MD  nystatin cream (MYCOSTATIN) Apply 1 application topically 4 (four) times daily. Apply to affected area every 4-6 hours x 10 days 09/09/16   Merrily Pew, MD  nystatin cream (MYCOSTATIN) Apply to affected area 2 times daily 11/04/16   Drenda Freeze, MD  nystatin-triamcinolone [facility]) cream Apply to affected area daily 11/30/16   Fatima Blank, MD  Omega-3 Fatty Acids (FISH OIL) 1000 MG CAPS Take 1,000 mg by mouth daily.    Historical Provider, MD  polyethylene glycol (MIRALAX / GLYCOLAX) packet Take 17 g by mouth daily. 07/24/16   Belkys A Regalado, MD  potassium chloride SA (K-DUR,KLOR-CON) 20 MEQ tablet Take 1 tablet (20 mEq total) by mouth 2 (two) times daily. 08/18/16   Amy D Clegg, NP  risperiDONE (RISPERDAL) 2 MG tablet Take 2 mg by mouth at bedtime.    Historical Provider, MD  rivaroxaban (XARELTO) 20 MG TABS tablet Take 1 tablet (20 mg total) by mouth daily with supper. 07/18/16   Barton Dubois, MD  traMADol (ULTRAM) 50 MG tablet Take 50 mg by mouth every 8 (eight) hours as needed for severe pain.    Historical Provider, MD  traMADol (ULTRAM) 50 MG tablet Take 1 tablet (50 mg total) by mouth every 6 (six) hours as needed. 11/04/16   Drenda Freeze, MD    triamcinolone cream (KENALOG) 0.1 % Apply 1 application topically 2 (two) times daily. 11/04/16   Drenda Freeze, MD  zolpidem (AMBIEN) 5 MG tablet Take 5 mg by mouth at bedtime.    Historical Provider, MD    Family History Family History  Problem Relation Age of Onset  . Hypertension Sister   . Bipolar disorder Other     Social History Social History  Substance Use Topics  . Smoking status: Never Smoker  . Smokeless tobacco: Never Used  . Alcohol use No     Allergies   Methimazole; Propylthiouracil; Chocolate; Other; Peanut-containing drug products; Penicillins; and Vicodin [hydrocodone-acetaminophen]   Review of Systems Review of Systems  Skin:  Positive for rash.  Ten systems are reviewed and are negative for acute change except as noted in the HPI    Physical Exam Updated Vital Signs BP 117/80 (BP Location: Right Arm)   Pulse 60   Temp 98 F (36.7 C) (Oral)   Ht 5' (1.524 m)   Wt 282 lb (127.9 kg)   SpO2 94%   BMI 55.07 kg/m   Physical Exam  Constitutional: She is oriented to person, place, and time. She appears well-developed and well-nourished. No distress.  HENT:  Head: Normocephalic and atraumatic.  Nose: Nose normal.  Eyes: Conjunctivae and EOM are normal. Pupils are equal, round, and reactive to light. Right eye exhibits no discharge. Left eye exhibits no discharge. No scleral icterus.  Neck: Normal range of motion. Neck supple.  Cardiovascular: Normal rate and regular rhythm.  Exam reveals no gallop and no friction rub.   No murmur heard. Pulmonary/Chest: Effort normal and breath sounds normal. No stridor. No respiratory distress. She has no rales.  Abdominal: Soft. She exhibits no distension. There is no tenderness.  Musculoskeletal: She exhibits no edema or tenderness.  Neurological: She is alert and oriented to person, place, and time.  Skin: Skin is warm and dry. Rash noted. Rash is macular. She is not diaphoretic. There is erythema.   Chaperone present during pelvic exam.  Under bilateral breasts, across lower abdomen, and perineal region.  Psychiatric: She has a normal mood and affect.  Vitals reviewed.    ED Treatments / Results  Labs (all labs ordered are listed, but only abnormal results are displayed) Labs Reviewed - No data to display  EKG  EKG Interpretation None       Radiology No results found.  Procedures Procedures (including critical care time)  Medications Ordered in ED Medications - No data to display   Initial Impression / Assessment and Plan / ED Course  I have reviewed the triage vital signs and the nursing notes.  Pertinent labs & imaging results that were available during my care of the patient were reviewed by me and considered in my medical decision making (see chart for details).  Clinical Course     Persistent candidal dermatitis. No superimposed infected noted. Antifungal course extended. The patient is safe for discharge with strict return precautions.   Final Clinical Impressions(s) / ED Diagnoses   Final diagnoses:  Candidal dermatitis   Disposition: Discharge  Condition: Good  I have discussed the results, Dx and Tx plan with the patient who expressed understanding and agree(s) with the plan. Discharge instructions discussed at great length. The patient was given strict return precautions who verbalized understanding of the instructions. No further questions at time of discharge.    Discharge Medication List as of 11/30/2016  1:59 PM      Follow Up: Primary care provider  Schedule an appointment as soon as possible for a visit in 2 days For close follow up to assess for candidal infection      Fatima Blank, MD 12/01/16 1131

## 2016-12-02 IMAGING — CR DG CHEST 2V
2 series · 2 of 2 positions shown · non-contrast
Comparison: 07/19/2016.  04/07/2016.

CLINICAL DATA: Chest pain and shortness of breath over the last
several weeks. Stroke an myocardial infarction 3 weeks ago

EXAM:
CHEST  2 VIEW

[w chest pa]
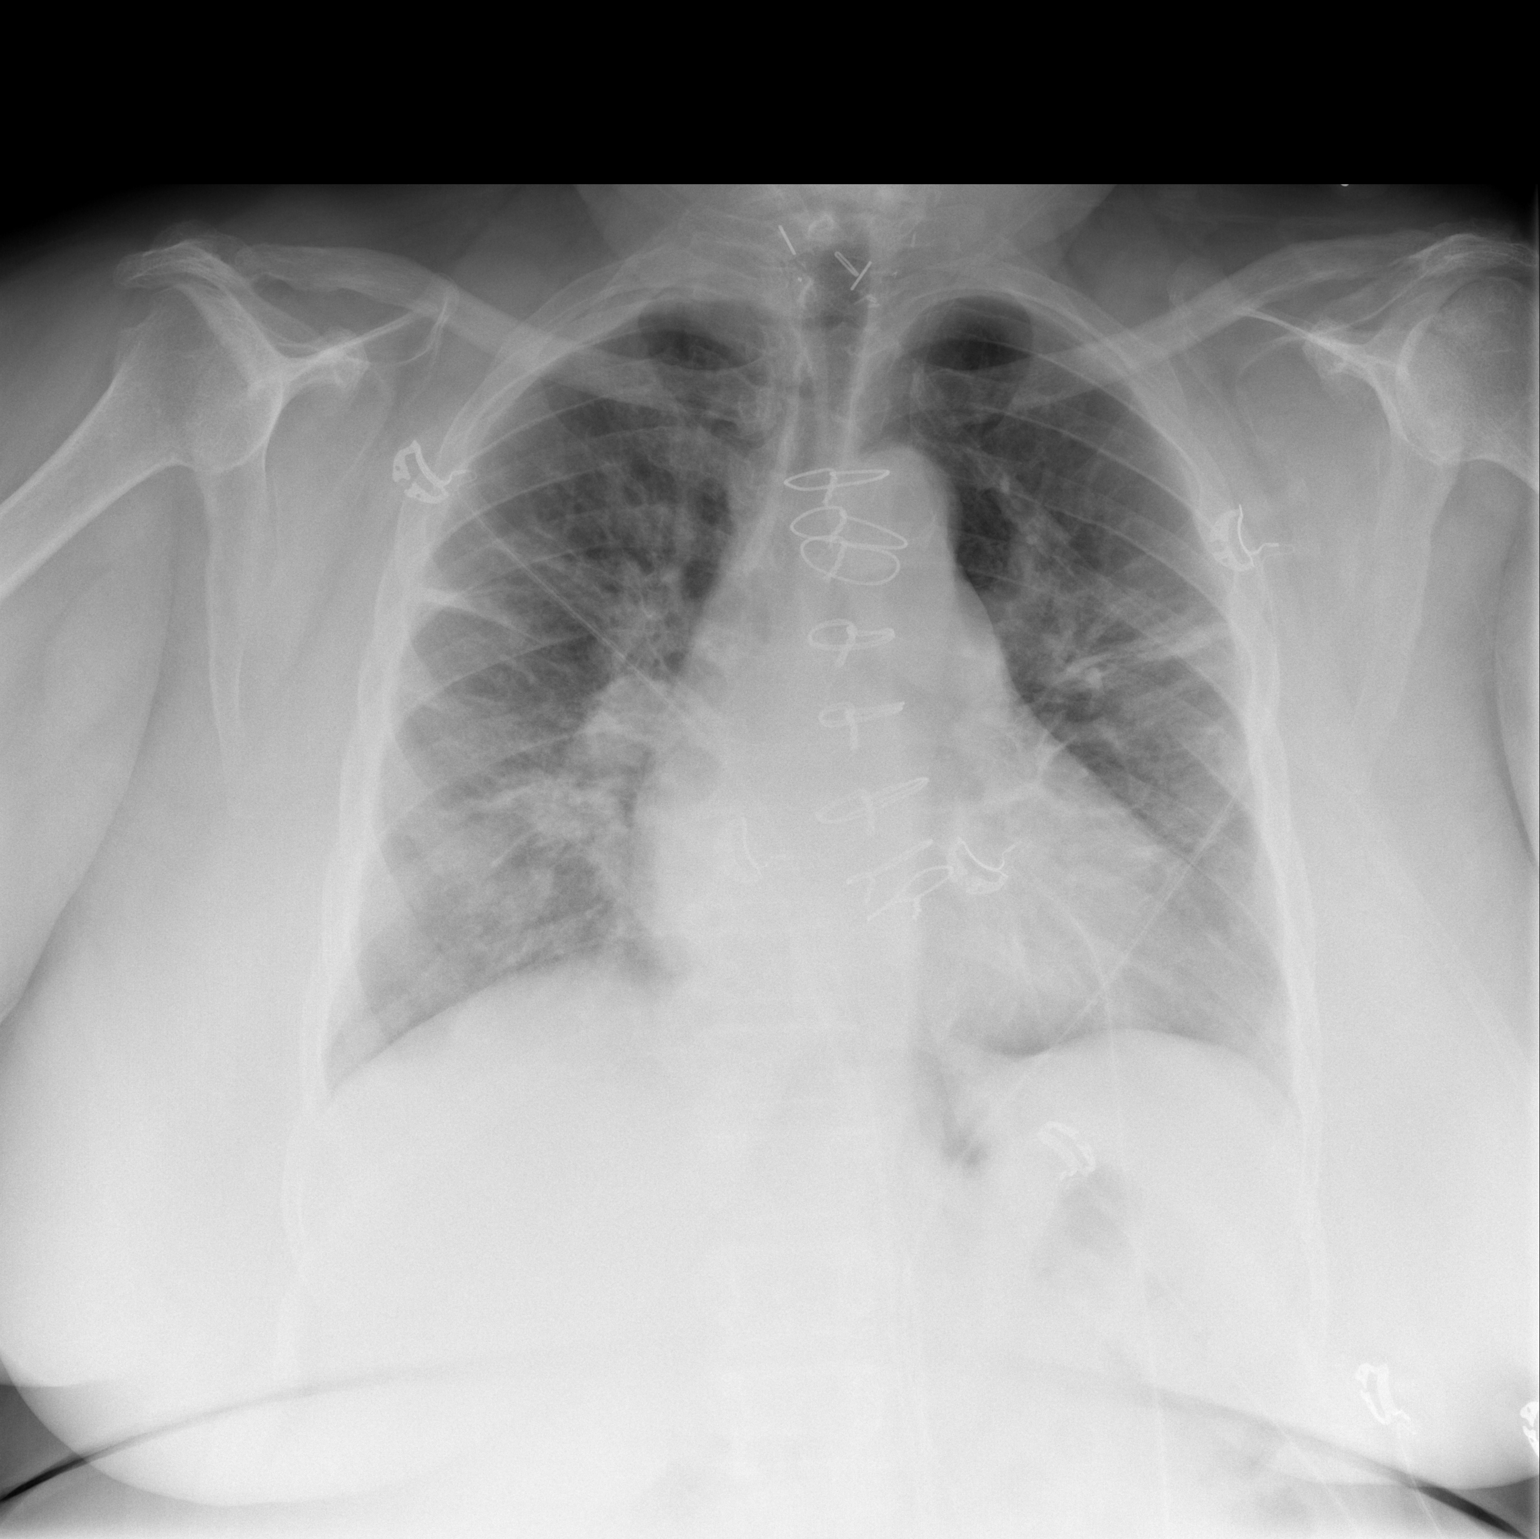

[w chest lat]
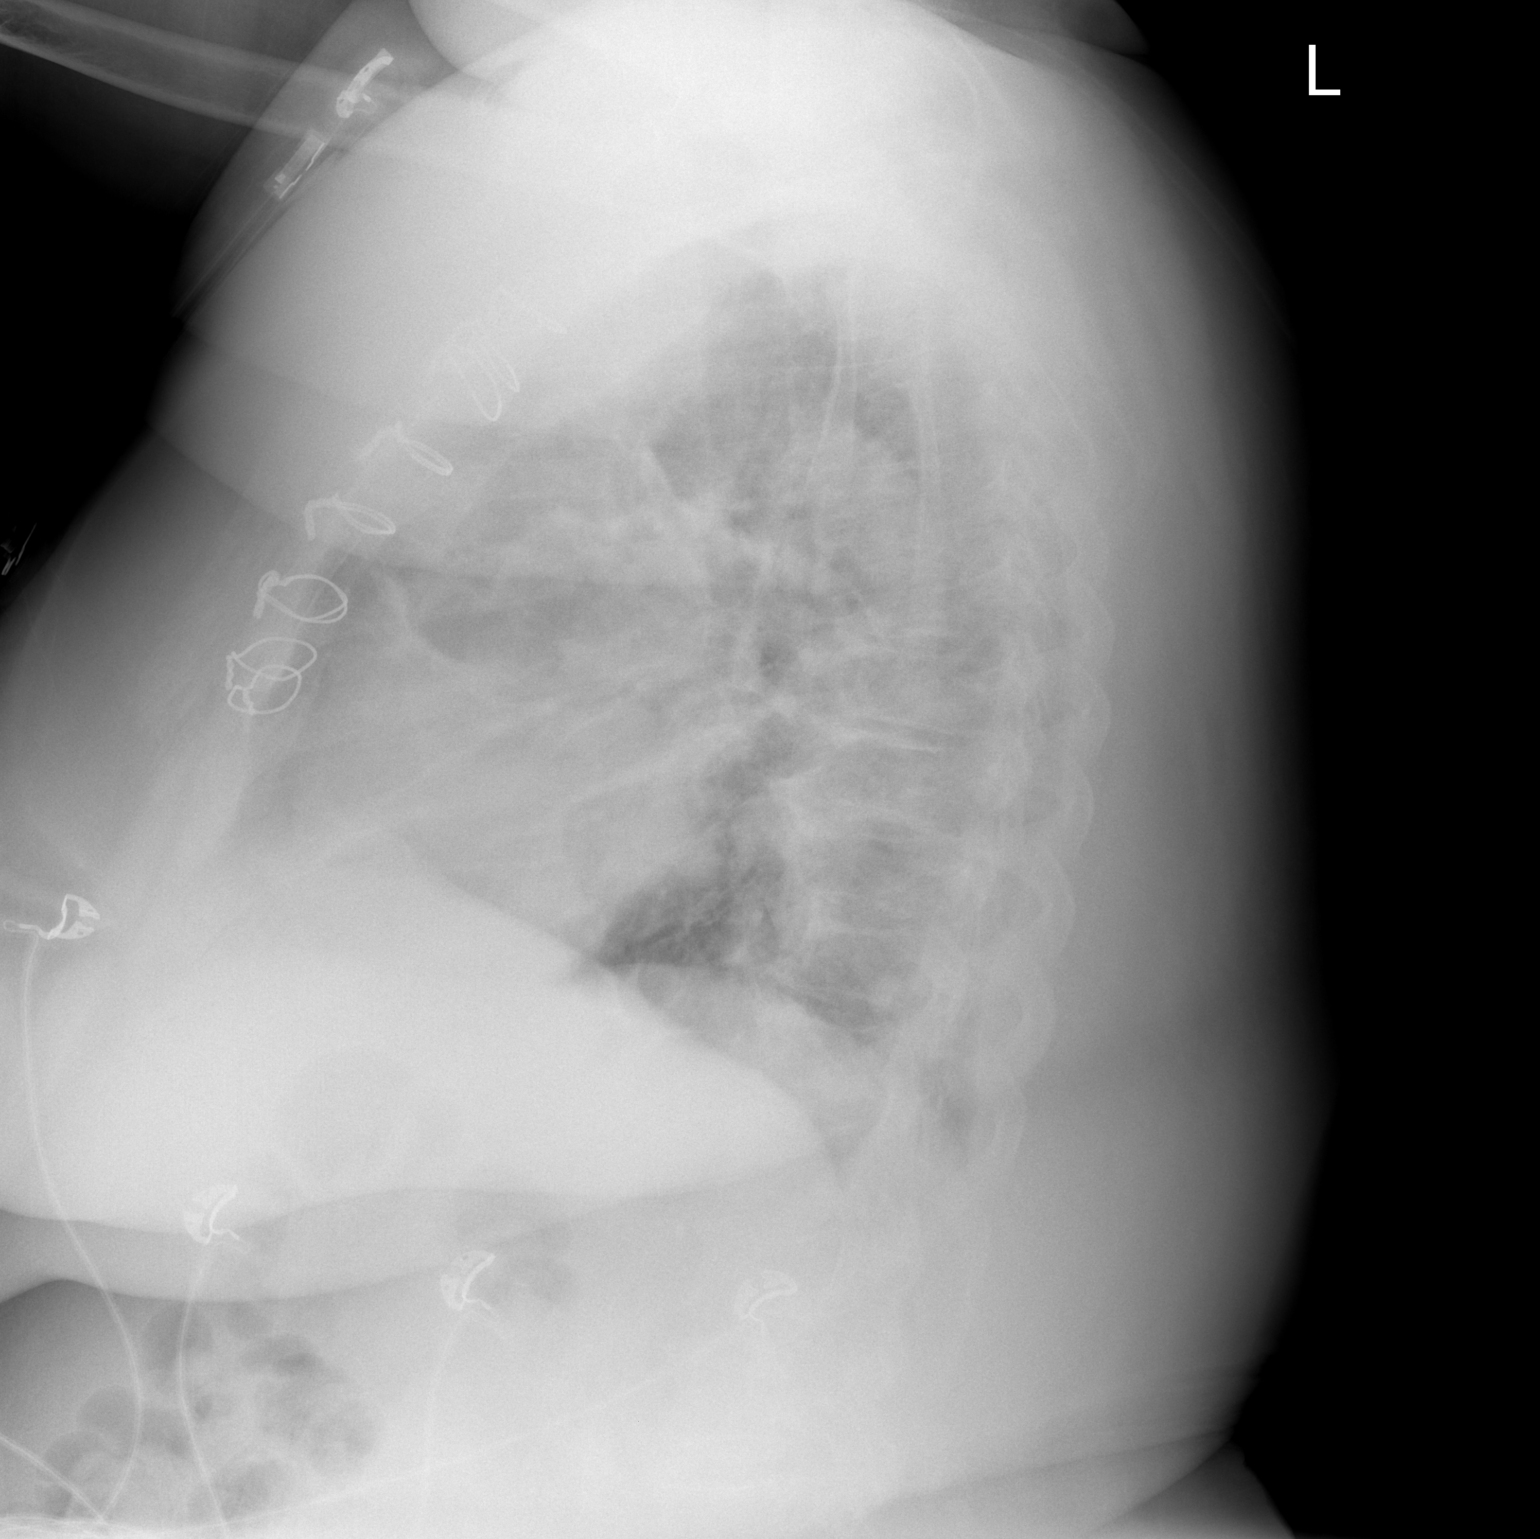

[2 of 2 positions shown; findings below may reference images not displayed]

FINDINGS: Previous median sternotomy. Heart size is normal. There is
atherosclerosis of the aorta. There is a background pattern of
abnormal interstitial lung markings particularly on the left, but
these are more pronounced than were seen previously, consistent with
active atelectasis or patchy infiltrate. I think there is also
probably pulmonary venous hypertension. No effusions. No acute bone
finding.
IMPRESSION: Background pattern of chronic interstitial lung markings which are
more prominent on today's study. This could be due to simple
atelectasis or atelectatic patchy pneumonia. Pulmonary venous
hypertension also probably present.

## 2017-01-20 ENCOUNTER — Ambulatory Visit: Payer: Medicare Other | Admitting: Neurology

## 2017-01-25 ENCOUNTER — Encounter: Payer: Self-pay | Admitting: Neurology

## 2018-01-20 DEATH — deceased
